# Patient Record
Sex: Female | Born: 1955 | Race: White | Hispanic: No | Smoking: Former smoker
Health system: Southern US, Community
[De-identification: ages and names within clinical notes are randomized; demographics above are authoritative.]

## PROBLEM LIST (undated history)

## (undated) DIAGNOSIS — E119 Type 2 diabetes mellitus without complications: Secondary | ICD-10-CM

## (undated) DIAGNOSIS — Z8489 Family history of other specified conditions: Secondary | ICD-10-CM

## (undated) DIAGNOSIS — K219 Gastro-esophageal reflux disease without esophagitis: Secondary | ICD-10-CM

## (undated) DIAGNOSIS — T8859XA Other complications of anesthesia, initial encounter: Secondary | ICD-10-CM

## (undated) DIAGNOSIS — D649 Anemia, unspecified: Secondary | ICD-10-CM

## (undated) DIAGNOSIS — J449 Chronic obstructive pulmonary disease, unspecified: Secondary | ICD-10-CM

## (undated) DIAGNOSIS — E039 Hypothyroidism, unspecified: Secondary | ICD-10-CM

## (undated) DIAGNOSIS — Z9882 Breast implant status: Secondary | ICD-10-CM

## (undated) DIAGNOSIS — F419 Anxiety disorder, unspecified: Secondary | ICD-10-CM

## (undated) DIAGNOSIS — C50919 Malignant neoplasm of unspecified site of unspecified female breast: Secondary | ICD-10-CM

## (undated) DIAGNOSIS — J189 Pneumonia, unspecified organism: Secondary | ICD-10-CM

## (undated) DIAGNOSIS — I10 Essential (primary) hypertension: Secondary | ICD-10-CM

## (undated) DIAGNOSIS — Z9189 Other specified personal risk factors, not elsewhere classified: Secondary | ICD-10-CM

## (undated) HISTORY — DX: Malignant neoplasm of unspecified site of unspecified female breast: C50.919

## (undated) HISTORY — DX: Essential (primary) hypertension: I10

## (undated) HISTORY — PX: MASTECTOMY: SHX3

## (undated) HISTORY — PX: TONSILLECTOMY: SUR1361

## (undated) HISTORY — PX: OTHER SURGICAL HISTORY: SHX169

## (undated) HISTORY — PX: BREAST SURGERY: SHX581

## (undated) HISTORY — DX: Other specified personal risk factors, not elsewhere classified: Z91.89

## (undated) HISTORY — DX: Chronic obstructive pulmonary disease, unspecified: J44.9

## (undated) HISTORY — PX: BREAST ENHANCEMENT SURGERY: SHX7

## (undated) HISTORY — PX: DILATION AND CURETTAGE OF UTERUS: SHX78

---

## 2004-10-30 DIAGNOSIS — Z789 Other specified health status: Secondary | ICD-10-CM

## 2004-10-30 HISTORY — DX: Other specified health status: Z78.9

## 2009-04-13 ENCOUNTER — Emergency Department: Payer: Self-pay | Admitting: Unknown Physician Specialty

## 2010-03-02 ENCOUNTER — Emergency Department: Payer: Self-pay | Admitting: Emergency Medicine

## 2010-03-04 ENCOUNTER — Emergency Department: Payer: Self-pay | Admitting: Unknown Physician Specialty

## 2010-03-28 ENCOUNTER — Ambulatory Visit: Payer: Self-pay | Admitting: Internal Medicine

## 2010-04-04 ENCOUNTER — Ambulatory Visit: Payer: Self-pay | Admitting: Gastroenterology

## 2012-05-23 ENCOUNTER — Ambulatory Visit: Payer: Self-pay | Admitting: Physician Assistant

## 2013-01-06 ENCOUNTER — Ambulatory Visit: Payer: Self-pay

## 2013-07-17 ENCOUNTER — Ambulatory Visit: Payer: Self-pay | Admitting: Family

## 2014-04-10 ENCOUNTER — Ambulatory Visit: Payer: Self-pay | Admitting: Family Medicine

## 2014-09-09 ENCOUNTER — Ambulatory Visit: Payer: Self-pay | Admitting: Family Medicine

## 2014-09-22 ENCOUNTER — Ambulatory Visit: Payer: Self-pay | Admitting: Family Medicine

## 2014-09-23 ENCOUNTER — Ambulatory Visit: Payer: Self-pay | Admitting: Family Medicine

## 2014-09-23 DIAGNOSIS — E785 Hyperlipidemia, unspecified: Secondary | ICD-10-CM | POA: Insufficient documentation

## 2014-09-23 DIAGNOSIS — R0789 Other chest pain: Secondary | ICD-10-CM | POA: Insufficient documentation

## 2014-09-23 DIAGNOSIS — I10 Essential (primary) hypertension: Secondary | ICD-10-CM | POA: Insufficient documentation

## 2014-10-07 ENCOUNTER — Other Ambulatory Visit: Payer: Self-pay | Admitting: Surgery

## 2014-10-07 DIAGNOSIS — D0512 Intraductal carcinoma in situ of left breast: Secondary | ICD-10-CM

## 2014-10-09 ENCOUNTER — Ambulatory Visit
Admission: RE | Admit: 2014-10-09 | Discharge: 2014-10-09 | Disposition: A | Discharge status: Home / Self Care | Payer: 59 | Source: Ambulatory Visit | Attending: Surgery | Admitting: Surgery

## 2014-10-09 DIAGNOSIS — D0512 Intraductal carcinoma in situ of left breast: Secondary | ICD-10-CM

## 2014-10-09 MED ORDER — GADOBENATE DIMEGLUMINE 529 MG/ML IV SOLN
16.0000 mL | Freq: Once | INTRAVENOUS | Status: AC | PRN
  Administered 2014-10-09: 16 mL via INTRAVENOUS

## 2014-10-13 ENCOUNTER — Ambulatory Visit: Payer: Self-pay | Admitting: Oncology

## 2014-10-26 DIAGNOSIS — C50911 Malignant neoplasm of unspecified site of right female breast: Secondary | ICD-10-CM | POA: Insufficient documentation

## 2014-10-30 ENCOUNTER — Ambulatory Visit: Payer: Self-pay | Admitting: Oncology

## 2014-12-10 ENCOUNTER — Ambulatory Visit: Payer: Self-pay | Admitting: Oncology

## 2014-12-29 ENCOUNTER — Ambulatory Visit
Admit: 2014-12-29 | Disposition: A | Discharge status: Home / Self Care | Payer: Self-pay | Attending: Oncology | Admitting: Oncology

## 2015-02-22 LAB — SURGICAL PATHOLOGY

## 2015-04-13 ENCOUNTER — Other Ambulatory Visit: Payer: Self-pay | Admitting: *Deleted

## 2015-04-13 DIAGNOSIS — C50919 Malignant neoplasm of unspecified site of unspecified female breast: Secondary | ICD-10-CM

## 2015-04-15 ENCOUNTER — Inpatient Hospital Stay (HOSPITAL_BASED_OUTPATIENT_CLINIC_OR_DEPARTMENT_OTHER): Payer: 59 | Admitting: Oncology

## 2015-04-15 ENCOUNTER — Encounter: Payer: Self-pay | Admitting: Oncology

## 2015-04-15 ENCOUNTER — Inpatient Hospital Stay: Payer: 59 | Attending: Oncology

## 2015-04-15 VITALS — BP 95/62 | HR 93 | Temp 98.6°F | Resp 18 | Ht 60.0 in | Wt 173.1 lb

## 2015-04-15 DIAGNOSIS — C50919 Malignant neoplasm of unspecified site of unspecified female breast: Secondary | ICD-10-CM

## 2015-04-15 DIAGNOSIS — Z79899 Other long term (current) drug therapy: Secondary | ICD-10-CM | POA: Diagnosis not present

## 2015-04-15 DIAGNOSIS — Z87891 Personal history of nicotine dependence: Secondary | ICD-10-CM | POA: Diagnosis not present

## 2015-04-15 DIAGNOSIS — I10 Essential (primary) hypertension: Secondary | ICD-10-CM | POA: Diagnosis not present

## 2015-04-15 DIAGNOSIS — Z853 Personal history of malignant neoplasm of breast: Secondary | ICD-10-CM | POA: Diagnosis not present

## 2015-04-15 DIAGNOSIS — Z7982 Long term (current) use of aspirin: Secondary | ICD-10-CM | POA: Insufficient documentation

## 2015-04-15 DIAGNOSIS — J449 Chronic obstructive pulmonary disease, unspecified: Secondary | ICD-10-CM

## 2015-04-15 DIAGNOSIS — Z9013 Acquired absence of bilateral breasts and nipples: Secondary | ICD-10-CM

## 2015-04-15 DIAGNOSIS — C50912 Malignant neoplasm of unspecified site of left female breast: Secondary | ICD-10-CM

## 2015-04-16 LAB — CANCER ANTIGEN 27.29: CA 27.29: 25.8 U/mL (ref 0.0–38.6)

## 2015-05-03 DIAGNOSIS — C50512 Malignant neoplasm of lower-outer quadrant of left female breast: Secondary | ICD-10-CM | POA: Insufficient documentation

## 2015-05-03 NOTE — Progress Notes (Signed)
[place]  Telephone:(336) [phone] Fax:(336) [phone]  ID: Autumn Ramos OB: 1955-12-01  MR#: [id number]  [id number]#:[id number]  [name], MD as PCP - General (Family Medicine)  CHIEF COMPLAINT:  Chief Complaint  Patient presents with  . Follow-up    Breast Cancer    INTERVAL HISTORY: Patient returns to clinic today for routine 4 month evaluation. She underwent bilateral mastectomy with implants on November 20, 2014 at [facility]. Currently, she feels well and is asymptomatic. She has no neurologic complaints. She denies any pain. She denies any recent fevers or illnesses. She has a good appetite and denies weight loss. She denies any chest pain or shortness of breath. She denies any nausea, vomiting, constipation, or diarrhea. She has no urinary complaints. Patient offers no specific complaints today.   REVIEW OF SYSTEMS:   Review of Systems  Constitutional: Negative.   Respiratory: Negative.   Cardiovascular: Negative.   Musculoskeletal: Negative.     As per HPI. Otherwise, a complete review of systems is negatve.  PAST MEDICAL HISTORY: Past Medical History  Diagnosis Date  . Breast cancer   . COPD (chronic obstructive pulmonary disease)   . Hypertension     PAST SURGICAL HISTORY: History reviewed. No pertinent past surgical history.  FAMILY HISTORY Family History  Problem Relation Age of Onset  . Coronary artery disease Father   . Coronary artery disease Mother   . Hypertension Father   . Hypertension Mother        ADVANCED DIRECTIVES:    HEALTH MAINTENANCE: History  Substance Use Topics  . Smoking status: Former Smoker -- 0.25 packs/day for 25 years    Types: Cigarettes    Quit date: 06/14/2014  . Smokeless tobacco: Never Used  . Alcohol Use: No     Colonoscopy:  PAP:  Bone density:  Lipid panel:  Allergies  Allergen Reactions  . Acetylcysteine Shortness Of Breath  . Codeine Itching and Nausea And Vomiting     Current Outpatient Prescriptions  Medication Sig Dispense Refill  . albuterol (PROAIR HFA) 108 (90 BASE) MCG/ACT inhaler 1 or 2 puff by inhalation every 4 hours as needed    . Alogliptin-Metformin HCl (KAZANO) 12.5-500 MG TABS Take 1 tablet by mouth 2 (two) times daily.    Marland Kitchen ALPRAZolam (XANAX) 0.5 MG tablet Take 0.5 mg by mouth.    Marland Kitchen atorvastatin (LIPITOR) 40 MG tablet Take 1 tablet by mouth daily.    Marland Kitchen dexlansoprazole (DEXILANT) 60 MG capsule Take 1 tablet by mouth daily.    . Fluticasone-Salmeterol (ADVAIR DISKUS) 500-50 MCG/DOSE AEPB 1 puff daily.    Marland Kitchen glucose blood (ONE TOUCH ULTRA TEST) test strip     . ibuprofen (ADVIL,MOTRIN) 800 MG tablet     . isometheptene-acetaminophen-dichloralphenazone (MIDRIN) 65-100-325 MG capsule Take 2 capsules by mouth as needed.    Marland Kitchen levothyroxine (SYNTHROID, LEVOTHROID) 75 MCG tablet Take 75 mcg by mouth daily.  0  . lisinopril (PRINIVIL,ZESTRIL) 10 MG tablet Take 1 tablet by mouth daily.    . ondansetron (ZOFRAN) 8 MG tablet     . RA VITAMIN D-3 2000 UNITS CAPS Take 1 tablet by mouth daily.  0  . tiotropium (SPIRIVA HANDIHALER) 18 MCG inhalation capsule Take by mouth.    Marland Kitchen aspirin EC 81 MG tablet Take 1 tablet by mouth daily.     No current facility-administered medications for this visit.    OBJECTIVE: Filed Vitals:   04/15/15 1629  BP: 95/62  Pulse: 93  Temp: 98.6 F (37 C)  Resp: 18     Body mass index is 33.8 kg/(m^2).    ECOG FS:0 - Asymptomatic  General: Well-developed, well-nourished, no acute distress. Eyes: anicteric sclera. Breasts: Patient has bilateral mastectomy with reconstruction. Exam deferred today. Lungs: Clear to auscultation bilaterally. Heart: Regular rate and rhythm. No rubs, murmurs, or gallops. Abdomen: Soft, nontender, nondistended. No organomegaly noted, normoactive bowel sounds. Musculoskeletal: No edema, cyanosis, or clubbing. Neuro: Alert, answering all questions appropriately. Cranial nerves grossly  intact. Skin: No rashes or petechiae noted. Psych: Normal affect.  LAB RESULTS:  No results found for: NA, K, CL, CO2, GLUCOSE, BUN, CREATININE, CALCIUM, PROT, ALBUMIN, AST, ALT, ALKPHOS, BILITOT, GFRNONAA, GFRAA  No results found for: WBC, NEUTROABS, HGB, HCT, MCV, PLT   STUDIES: No results found.  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  PLAN:    1. Breast cancer: Patient is triple negative, her tumor size was less than 0.5 cm therefore no advjuvant chemotherapy was recommended. She had bilateral mastectomy so she does not require XRT. She does not require an aromatase inhibitor given the triple negative status of her disease. CA 27-29 is within normal limits. No intervention is needed at this time. Return to clinic in 4 months for routine evaluation.   Patient expressed understanding and was in agreement with this plan. She also understands that She can call clinic at any time with any questions, concerns, or complaints.   Breast cancer   Staging form: Breast, AJCC 7th Edition     Clinical stage from 05/03/2015: Stage IA (T1b, N0, M0) - Signed by Lloyd Huger, MD on 05/03/2015   Lloyd Huger, MD   05/03/2015 1:08 PM

## 2015-08-19 ENCOUNTER — Inpatient Hospital Stay: Payer: 59 | Attending: Oncology

## 2015-08-26 ENCOUNTER — Inpatient Hospital Stay: Payer: 59 | Admitting: Oncology

## 2016-02-17 ENCOUNTER — Encounter: Payer: Self-pay | Admitting: Oncology

## 2016-02-17 ENCOUNTER — Inpatient Hospital Stay: Payer: 59 | Attending: Oncology

## 2016-02-17 ENCOUNTER — Inpatient Hospital Stay (HOSPITAL_BASED_OUTPATIENT_CLINIC_OR_DEPARTMENT_OTHER): Payer: 59 | Admitting: Oncology

## 2016-02-17 VITALS — BP 107/70 | HR 91 | Resp 16 | Wt 171.1 lb

## 2016-02-17 DIAGNOSIS — J449 Chronic obstructive pulmonary disease, unspecified: Secondary | ICD-10-CM | POA: Insufficient documentation

## 2016-02-17 DIAGNOSIS — Z79899 Other long term (current) drug therapy: Secondary | ICD-10-CM

## 2016-02-17 DIAGNOSIS — Z87891 Personal history of nicotine dependence: Secondary | ICD-10-CM | POA: Diagnosis not present

## 2016-02-17 DIAGNOSIS — I10 Essential (primary) hypertension: Secondary | ICD-10-CM | POA: Diagnosis not present

## 2016-02-17 DIAGNOSIS — C50912 Malignant neoplasm of unspecified site of left female breast: Secondary | ICD-10-CM

## 2016-02-17 DIAGNOSIS — Z7982 Long term (current) use of aspirin: Secondary | ICD-10-CM

## 2016-02-17 DIAGNOSIS — Z9013 Acquired absence of bilateral breasts and nipples: Secondary | ICD-10-CM

## 2016-02-17 DIAGNOSIS — Z853 Personal history of malignant neoplasm of breast: Secondary | ICD-10-CM

## 2016-02-17 DIAGNOSIS — Z171 Estrogen receptor negative status [ER-]: Secondary | ICD-10-CM

## 2016-02-17 NOTE — Progress Notes (Signed)
Patient is here for f/u after 8 months.

## 2016-02-17 NOTE — Progress Notes (Signed)
[place]  Telephone:(336) [phone] Fax:(336) [phone]  ID: Autumn Ramos OB: 1956-06-07  MR#: [id number]  [id number]  [name], MD as PCP - General (Family Medicine)  CHIEF COMPLAINT:  Chief Complaint  Patient presents with  . Breast Cancer    INTERVAL HISTORY: Patient returns to clinic today for routine follow up for breast cancer. Currently, she feels well and is asymptomatic. She has no neurologic complaints. She denies any pain. She denies any recent fevers or illnesses. She has a good appetite and denies weight loss. She denies any chest pain or shortness of breath. She denies any nausea, vomiting, constipation, or diarrhea. She has no urinary complaints. Patient offers no specific complaints today.   REVIEW OF SYSTEMS:   Review of Systems  Constitutional: Negative.   Respiratory: Negative.   Cardiovascular: Negative.   Musculoskeletal: Negative.     As per HPI. Otherwise, a complete review of systems is negatve.  PAST MEDICAL HISTORY: Past Medical History  Diagnosis Date  . Breast cancer ([place])   . COPD (chronic obstructive pulmonary disease) ([place])   . Hypertension   . Last menstrual period (LMP) > 10 days ago 2006    PAST SURGICAL HISTORY: No past surgical history on file.  FAMILY HISTORY Family History  Problem Relation Age of Onset  . Coronary artery disease Father   . Coronary artery disease Mother   . Hypertension Father   . Hypertension Mother        ADVANCED DIRECTIVES:    HEALTH MAINTENANCE: Social History  Substance Use Topics  . Smoking status: Former Smoker -- 0.25 packs/day for 25 years    Types: Cigarettes    Quit date: 06/14/2014  . Smokeless tobacco: Never Used  . Alcohol Use: No    Allergies  Allergen Reactions  . Acetylcysteine Shortness Of Breath  . Codeine Itching and Nausea And Vomiting    Current Outpatient Prescriptions  Medication Sig Dispense Refill  . albuterol  (PROAIR HFA) 108 (90 BASE) MCG/ACT inhaler 1 or 2 puff by inhalation every 4 hours as needed    . Alogliptin-Metformin HCl (KAZANO) 12.5-500 MG TABS Take 1 tablet by mouth 2 (two) times daily.    Marland Kitchen atorvastatin (LIPITOR) 80 MG tablet   0  . dexlansoprazole (DEXILANT) 60 MG capsule Take 1 tablet by mouth daily.    . Fluticasone-Salmeterol (ADVAIR DISKUS) 500-50 MCG/DOSE AEPB 1 puff daily.    Marland Kitchen glucose blood (ONE TOUCH ULTRA TEST) test strip     . ibuprofen (ADVIL,MOTRIN) 800 MG tablet     . isometheptene-acetaminophen-dichloralphenazone (MIDRIN) 65-100-325 MG capsule Take 2 capsules by mouth as needed.    Marland Kitchen levothyroxine (SYNTHROID, LEVOTHROID) 75 MCG tablet Take 75 mcg by mouth daily.  0  . lisinopril (PRINIVIL,ZESTRIL) 10 MG tablet Take 1 tablet by mouth daily.    . ondansetron (ZOFRAN) 8 MG tablet     . RA VITAMIN D-3 2000 UNITS CAPS Take 1 tablet by mouth daily.  0  . tiotropium (SPIRIVA HANDIHALER) 18 MCG inhalation capsule Take by mouth.    Marland Kitchen aspirin EC 81 MG tablet Take 1 tablet by mouth daily. Reported on 02/17/2016     No current facility-administered medications for this visit.    OBJECTIVE: Filed Vitals:   02/17/16 1551  BP: 107/70  Pulse: 91  Resp: 16     Body mass index is 33.41 kg/(m^2).    ECOG FS:0 - Asymptomatic  General: Well-developed, well-nourished, no acute distress. Eyes: anicteric  sclera. Breasts: Patient has bilateral mastectomy with reconstruction. Exam deferred today. Lungs: Clear to auscultation bilaterally. Heart: Regular rate and rhythm. No rubs, murmurs, or gallops. Abdomen: Soft, nontender, nondistended. No organomegaly noted, normoactive bowel sounds. Musculoskeletal: No edema, cyanosis, or clubbing. Neuro: Alert, answering all questions appropriately. Cranial nerves grossly intact. Skin: No rashes or petechiae noted. Psych: Normal affect.  LAB RESULTS:  No results found for: NA, K, CL, CO2, GLUCOSE, BUN, CREATININE, CALCIUM, PROT, ALBUMIN, AST,  ALT, ALKPHOS, BILITOT, GFRNONAA, GFRAA  No results found for: WBC, NEUTROABS, HGB, HCT, MCV, PLT  Lab Results  Component Value Date   LABCA2 19.5 02/17/2016    STUDIES: No results found.  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  PLAN:    1. Breast cancer: Patient is triple negative, her tumor size was less than 0.5 cm therefore no advjuvant chemotherapy was recommended. She had bilateral mastectomy with implants on November 20, 2014 at [facility] so she does not require XRT. She does not require an aromatase inhibitor given the triple negative status of her disease. CA 27-29 continues to be WNL. No intervention is needed at this time. Return to clinic in 6 months for routine evaluation and she will have her tumor marker drawn 2-3 days prior to that visit.   Patient expressed understanding and was in agreement with this plan. She also understands that She can call clinic at any time with any questions, concerns, or complaints.   Breast cancer   Staging form: Breast, AJCC 7th Edition     Clinical stage from 05/03/2015: Stage IA (T1b, N0, M0) - Signed by Lloyd Huger, MD on 05/03/2015   Mayra Reel, NP   02/17/2016 4:15 PM  Patient was seen and evaluated independently and I agree with the assessment and plan as dictated above.  Lloyd Huger, MD 02/20/2016 8:11 PM

## 2016-02-18 ENCOUNTER — Other Ambulatory Visit: Payer: Self-pay

## 2016-02-18 ENCOUNTER — Emergency Department: Payer: 59

## 2016-02-18 ENCOUNTER — Emergency Department
Admission: EM | Admit: 2016-02-18 | Discharge: 2016-02-18 | Disposition: A | Discharge status: Home / Self Care | Payer: 59 | Attending: Emergency Medicine | Admitting: Emergency Medicine

## 2016-02-18 DIAGNOSIS — J40 Bronchitis, not specified as acute or chronic: Secondary | ICD-10-CM | POA: Insufficient documentation

## 2016-02-18 DIAGNOSIS — J449 Chronic obstructive pulmonary disease, unspecified: Secondary | ICD-10-CM | POA: Insufficient documentation

## 2016-02-18 DIAGNOSIS — Z7984 Long term (current) use of oral hypoglycemic drugs: Secondary | ICD-10-CM | POA: Insufficient documentation

## 2016-02-18 DIAGNOSIS — Z853 Personal history of malignant neoplasm of breast: Secondary | ICD-10-CM | POA: Insufficient documentation

## 2016-02-18 DIAGNOSIS — Z87891 Personal history of nicotine dependence: Secondary | ICD-10-CM | POA: Diagnosis not present

## 2016-02-18 DIAGNOSIS — I10 Essential (primary) hypertension: Secondary | ICD-10-CM | POA: Insufficient documentation

## 2016-02-18 DIAGNOSIS — R079 Chest pain, unspecified: Secondary | ICD-10-CM | POA: Diagnosis present

## 2016-02-18 DIAGNOSIS — Z791 Long term (current) use of non-steroidal anti-inflammatories (NSAID): Secondary | ICD-10-CM | POA: Diagnosis not present

## 2016-02-18 DIAGNOSIS — Z79899 Other long term (current) drug therapy: Secondary | ICD-10-CM | POA: Diagnosis not present

## 2016-02-18 LAB — COMPREHENSIVE METABOLIC PANEL
ALK PHOS: 64 U/L (ref 38–126)
ALT: 27 U/L (ref 14–54)
AST: 25 U/L (ref 15–41)
Albumin: 4 g/dL (ref 3.5–5.0)
Anion gap: 8 (ref 5–15)
BILIRUBIN TOTAL: 0.5 mg/dL (ref 0.3–1.2)
BUN: 11 mg/dL (ref 6–20)
CALCIUM: 9.2 mg/dL (ref 8.9–10.3)
CO2: 26 mmol/L (ref 22–32)
CREATININE: 0.64 mg/dL (ref 0.44–1.00)
Chloride: 106 mmol/L (ref 101–111)
GFR calc Af Amer: >60 mL/min (ref >60)
GLUCOSE: 112 mg/dL — AB (ref 65–99)
Potassium: 4.1 mmol/L (ref 3.5–5.1)
Sodium: 140 mmol/L (ref 135–145)
TOTAL PROTEIN: 7 g/dL (ref 6.5–8.1)

## 2016-02-18 LAB — CANCER ANTIGEN 27.29: CA 27.29: 19.5 U/mL (ref 0.0–38.6)

## 2016-02-18 LAB — CBC WITH DIFFERENTIAL/PLATELET
BASOS ABS: 0 10*3/uL (ref 0–0.1)
Basophils Relative: 1 %
Eosinophils Absolute: 0.1 10*3/uL (ref 0–0.7)
Eosinophils Relative: 2 %
HEMATOCRIT: 36.8 % (ref 35.0–47.0)
HEMOGLOBIN: 12.4 g/dL (ref 12.0–16.0)
LYMPHS PCT: 13 %
Lymphs Abs: 1.1 10*3/uL (ref 1.0–3.6)
MCH: 27.7 pg (ref 26.0–34.0)
MCHC: 33.7 g/dL (ref 32.0–36.0)
MCV: 82.2 fL (ref 80.0–100.0)
MONO ABS: 1 10*3/uL — AB (ref 0.2–0.9)
Monocytes Relative: 12 %
NEUTROS ABS: 6.1 10*3/uL (ref 1.4–6.5)
NEUTROS PCT: 72 %
Platelets: 320 10*3/uL (ref 150–440)
RBC: 4.47 MIL/uL (ref 3.80–5.20)
RDW: 15.1 % — AB (ref 11.5–14.5)
WBC: 8.4 10*3/uL (ref 3.6–11.0)

## 2016-02-18 LAB — LIPASE, BLOOD: LIPASE: 23 U/L (ref 11–51)

## 2016-02-18 LAB — TROPONIN I

## 2016-02-18 MED ORDER — AZITHROMYCIN 250 MG PO TABS: 250.0000 mg | ORAL_TABLET | Freq: Every day | ORAL | Status: DC

## 2016-02-18 MED ORDER — GI COCKTAIL ~~LOC~~
30.0000 mL | Freq: Once | ORAL | Status: AC
  Administered 2016-02-18: 30 mL via ORAL
  Filled 2016-02-18: qty 30

## 2016-02-18 MED ORDER — PREDNISONE 20 MG PO TABS: 40.0000 mg | ORAL_TABLET | Freq: Every day | ORAL | Status: DC

## 2016-02-18 MED ORDER — BENZONATATE 100 MG PO CAPS: 100.0000 mg | ORAL_CAPSULE | Freq: Four times a day (QID) | ORAL | Status: DC | PRN

## 2016-02-18 MED ORDER — AZITHROMYCIN 500 MG PO TABS
500.0000 mg | ORAL_TABLET | Freq: Once | ORAL | Status: AC
  Administered 2016-02-18: 500 mg via ORAL
  Filled 2016-02-18: qty 1

## 2016-02-18 MED ORDER — PREDNISONE 20 MG PO TABS
60.0000 mg | ORAL_TABLET | Freq: Once | ORAL | Status: AC
Start: 2016-02-18 — End: 2016-02-18
  Administered 2016-02-18: 60 mg via ORAL
  Filled 2016-02-18: qty 3

## 2016-02-18 NOTE — Discharge Instructions (Signed)
You have been seen in the emergency department today for chest pain and cough. Your workup has shown normal results, and is most consistent with bronchitis. As we discussed please follow-up with your primary care physician in the next several days for recheck. Return to the emergency department for any further chest pain, trouble breathing, or any other symptom personally concerning to yourself.   Nonspecific Chest Pain It is often hard to find the cause of chest pain. There is always a chance that your pain could be related to something serious, such as a heart attack or a blood clot in your lungs. Chest pain can also be caused by conditions that are not life-threatening. If you have chest pain, it is very important to follow up with your doctor.  HOME CARE  If you were prescribed an antibiotic medicine, finish it all even if you start to feel better.  Avoid any activities that cause chest pain.  Do not use any tobacco products, including cigarettes, chewing tobacco, or electronic cigarettes. If you need help quitting, ask your doctor.  Do not drink alcohol.  Take medicines only as told by your doctor.  Keep all follow-up visits as told by your doctor. This is important. This includes any further testing if your chest pain does not go away.  Your doctor may tell you to keep your head raised (elevated) while you sleep.  Make lifestyle changes as told by your doctor. These may include:  Getting regular exercise. Ask your doctor to suggest some activities that are safe for you.  Eating a heart-healthy diet. Your doctor or a diet specialist (dietitian) can help you to learn healthy eating options.  Maintaining a healthy weight.  Managing diabetes, if necessary.  Reducing stress. GET HELP IF:  Your chest pain does not go away, even after treatment.  You have a rash with blisters on your chest.  You have a fever. GET HELP RIGHT AWAY IF:  Your chest pain is worse.  You have an  increasing cough, or you cough up blood.  You have severe belly (abdominal) pain.  You feel extremely weak.  You pass out (faint).  You have chills.  You have sudden, unexplained chest discomfort.  You have sudden, unexplained discomfort in your arms, back, neck, or jaw.  You have shortness of breath at any time.  You suddenly start to sweat, or your skin gets clammy.  You feel nauseous.  You vomit.  You suddenly feel light-headed or dizzy.  Your heart begins to beat quickly, or it feels like it is skipping beats. These symptoms may be an emergency. Do not wait to see if the symptoms will go away. Get medical help right away. Call your local emergency services (911 in the [place] Do not drive yourself to the hospital.   This information is not intended to replace advice given to you by your health care provider. Make sure you discuss any questions you have with your health care provider.   Document Released: 04/03/2008 Document Revised: 11/06/2014 Document Reviewed: 05/22/2014 Elsevier Interactive Patient Education 2016 Elsevier Inc.  Upper Respiratory Infection, Adult Most upper respiratory infections (URIs) are caused by a virus. A URI affects the nose, throat, and upper air passages. The most common type of URI is often called "the common cold." HOME CARE   Take medicines only as told by your doctor.  Gargle warm saltwater or take cough drops to comfort your throat as told by your doctor.  Use a warm mist humidifier  or inhale steam from a shower to increase air moisture. This may make it easier to breathe.  Drink enough fluid to keep your pee (urine) clear or pale yellow.  Eat soups and other clear broths.  Have a healthy diet.  Rest as needed.  Go back to work when your fever is gone or your doctor says it is okay.  You may need to stay home longer to avoid giving your URI to others.  You can also wear a face mask and wash your hands often to prevent spread  of the virus.  Use your inhaler more if you have asthma.  Do not use any tobacco products, including cigarettes, chewing tobacco, or electronic cigarettes. If you need help quitting, ask your doctor. GET HELP IF:  You are getting worse, not better.  Your symptoms are not helped by medicine.  You have chills.  You are getting more short of breath.  You have brown or red mucus.  You have yellow or brown discharge from your nose.  You have pain in your face, especially when you bend forward.  You have a fever.  You have puffy (swollen) neck glands.  You have pain while swallowing.  You have white areas in the back of your throat. GET HELP RIGHT AWAY IF:   You have very bad or constant:  Headache.  Ear pain.  Pain in your forehead, behind your eyes, and over your cheekbones (sinus pain).  Chest pain.  You have long-lasting (chronic) lung disease and any of the following:  Wheezing.  Long-lasting cough.  Coughing up blood.  A change in your usual mucus.  You have a stiff neck.  You have changes in your:  Vision.  Hearing.  Thinking.  Mood. MAKE SURE YOU:   Understand these instructions.  Will watch your condition.  Will get help right away if you are not doing well or get worse.   This information is not intended to replace advice given to you by your health care provider. Make sure you discuss any questions you have with your health care provider.   Document Released: 04/03/2008 Document Revised: 03/02/2015 Document Reviewed: 01/21/2014 Elsevier Interactive Patient Education [employer].

## 2016-02-18 NOTE — ED Provider Notes (Signed)
[facility] Emergency Department Provider Note  Time seen: 8:27 AM  I have reviewed the triage vital signs and the nursing notes.   HISTORY  Chief Complaint Chest Pain and Shortness of Breath    HPI Autumn Ramos is a 60 y.o. female with a past medical history of hypertension, COPD presents the emergency department with cough, shortness of breath and chest discomfort. According to the patient for the past 2 days she has been feeling more short of breath, she has been coughing with yellow sputum production. States a history of COPD with frequent episodes of bronchitis. Also states this morning she was having some mild chest discomfort so she came to the emergency department for evaluation. States the chest discomfort is mostly with cough. Denies any leg pain or swelling. Denies any diaphoresis, or nausea. Patient states her husband passed away 4 weeks ago, and she has been under a lot of stress recently in which she states could be contributing to her chest discomfort. Patient states the chest discomfort is very slight currently dull aching pain to the center of her chest, continues to have a frequent cough.Denies any pleuritic component to her chest pain.     Past Medical History  Diagnosis Date  . Breast cancer ([place])   . COPD (chronic obstructive pulmonary disease) ([place])   . Hypertension   . Last menstrual period (LMP) > 10 days ago 2006    Patient Active Problem List   Diagnosis Date Noted  . Breast cancer ([place]) 05/03/2015    No past surgical history on file.  Current Outpatient Rx  Name  Route  Sig  Dispense  Refill  . albuterol (PROAIR HFA) 108 (90 BASE) MCG/ACT inhaler      1 or 2 puff by inhalation every 4 hours as needed         . Alogliptin-Metformin HCl (KAZANO) 12.5-500 MG TABS   Oral   Take 1 tablet by mouth 2 (two) times daily.         Marland Kitchen aspirin EC 81 MG tablet   Oral   Take 1 tablet by mouth daily. Reported on 02/17/2016        . atorvastatin (LIPITOR) 80 MG tablet            0   . dexlansoprazole (DEXILANT) 60 MG capsule   Oral   Take 1 tablet by mouth daily.         . Fluticasone-Salmeterol (ADVAIR DISKUS) 500-50 MCG/DOSE AEPB      1 puff daily.         Marland Kitchen glucose blood (ONE TOUCH ULTRA TEST) test strip               . ibuprofen (ADVIL,MOTRIN) 800 MG tablet               . isometheptene-acetaminophen-dichloralphenazone (MIDRIN) 65-100-325 MG capsule   Oral   Take 2 capsules by mouth as needed.         Marland Kitchen levothyroxine (SYNTHROID, LEVOTHROID) 75 MCG tablet   Oral   Take 75 mcg by mouth daily.      0   . lisinopril (PRINIVIL,ZESTRIL) 10 MG tablet   Oral   Take 1 tablet by mouth daily.         . ondansetron (ZOFRAN) 8 MG tablet               . RA VITAMIN D-3 2000 UNITS CAPS   Oral   Take 1 tablet by mouth  daily.      0     Dispense as written.   . tiotropium (SPIRIVA HANDIHALER) 18 MCG inhalation capsule   Oral   Take by mouth.           Allergies Acetylcysteine and Codeine  Family History  Problem Relation Age of Onset  . Coronary artery disease Father   . Coronary artery disease Mother   . Hypertension Father   . Hypertension Mother     Social History Social History  Substance Use Topics  . Smoking status: Former Smoker -- 0.25 packs/day for 25 years    Types: Cigarettes    Quit date: 06/14/2014  . Smokeless tobacco: Never Used  . Alcohol Use: No    Review of Systems Constitutional: Negative for fever. Cardiovascular: Mild chest discomfort. Respiratory: Mild shortness of breath. Moderate cough or sputum production. Gastrointestinal: Negative for abdominal pain, vomiting Musculoskeletal: Denies leg pain or swelling. Neurological: Negative for headache 10-point ROS otherwise negative.  ____________________________________________   PHYSICAL EXAM:  VITAL SIGNS: ED Triage Vitals  Enc Vitals Group     BP 02/18/16 0740 123/70 mmHg      Pulse Rate 02/18/16 0740 95     Resp 02/18/16 0740 18     Temp 02/18/16 0740 98.4 F (36.9 C)     Temp src --      SpO2 02/18/16 0740 97 %     Weight 02/18/16 0740 169 lb (76.658 kg)     Height 02/18/16 0740 5' (1.524 m)     Head Cir --      Peak Flow --      Pain Score 02/18/16 0741 5     Pain Loc --      Pain Edu? --      Excl. in [place]? --     Constitutional: Alert and oriented. Well appearing and in no distress. Eyes: Normal exam ENT   Head: Normocephalic and atraumatic.   Mouth/Throat: Mucous membranes are moist. Cardiovascular: Normal rate, regular rhythm. No murmur Respiratory: Normal respiratory effort without tachypnea nor retractions. Breath sounds are clear and equal bilaterally. No wheezes/rales/rhonchi. Occasional cough during exam. Gastrointestinal: Soft and nontender. No distention.  Musculoskeletal: Nontender with normal range of motion in all extremities. No lower extremity edema or tenderness to palpation. Neurologic:  Normal speech and language. No gross focal neurologic deficits  Skin:  Skin is warm, dry and intact.  Psychiatric: Mood and affect are normal.  ____________________________________________    EKG  EKG reviewed and interpreted by myself shows normal sinus rhythm at 92 bpm, narrow QRS, normal axis, normal intervals, nonspecific but no concerning ST changes.  ____________________________________________    RADIOLOGY  Chest x-ray negative  ____________________________________________   INITIAL IMPRESSION / ASSESSMENT AND PLAN / ED COURSE  Pertinent labs & imaging results that were available during my care of the patient were reviewed by me and considered in my medical decision making (see chart for details).  Patient presents the emergency department 2 days of cough, shortness of breath, and chest discomfort which began this morning. Overall the patient appears very well, normal physical exam besides occasional cough. Clear lung sounds  bilaterally. No leg pain or swelling. Vitals are within normal limits. We will check labs, chest x-ray, treat with prednisone and Zithromax while monitoring closely in the emergency department.  Chest x-ray negative. Labs within normal limits. Lipase negative. Repeat troponin negative. Patient had complete resolution of pain after GI cocktail. We'll discharge the patient with Tessalon, prednisone and  Zithromax. Patient agreeable to plan.  ____________________________________________   FINAL CLINICAL IMPRESSION(S) / ED DIAGNOSES  Bronchitis Chest pain Dyspnea   Harvest Dark, MD 02/18/16 1059

## 2016-02-18 NOTE — ED Notes (Signed)
Pt reports sob and cough starting yesterday with CP beginning this am. Pt reports taking 3 albuterol treatments over the past 12 hours with minimal relief. Pt currently in no acute distress

## 2016-02-23 ENCOUNTER — Encounter: Payer: Self-pay | Admitting: *Deleted

## 2016-02-24 ENCOUNTER — Encounter: Payer: Self-pay | Admitting: *Deleted

## 2016-02-24 NOTE — Progress Notes (Signed)
  Oncology Nurse Navigator Documentation      )                                                           

## 2016-02-24 NOTE — Progress Notes (Signed)
  Oncology Nurse Navigator Documentation  Navigator Location: [facility] (02/24/16 0800) Navigator Encounter Type: Telephone (02/24/16 0800)           Patient Visit Type: Follow-up (02/24/16 0800) Treatment Phase: Follow-up (02/24/16 0800) Barriers/Navigation Needs: Coordination of Care (02/24/16 0800)                          Time Spent with Patient: 30 (02/24/16 0800)   Patient called and wanted her lab results.  Confirmed with Dr. Grayland Ormond.  Called and informed patient of her normal labs.  She is to call if she has any questions or needs.

## 2016-02-27 ENCOUNTER — Emergency Department: Payer: 59

## 2016-02-27 ENCOUNTER — Emergency Department
Admission: EM | Admit: 2016-02-27 | Discharge: 2016-02-27 | Disposition: A | Discharge status: Home / Self Care | Payer: 59 | Attending: Student | Admitting: Student

## 2016-02-27 DIAGNOSIS — Z7951 Long term (current) use of inhaled steroids: Secondary | ICD-10-CM | POA: Insufficient documentation

## 2016-02-27 DIAGNOSIS — Z87891 Personal history of nicotine dependence: Secondary | ICD-10-CM | POA: Diagnosis not present

## 2016-02-27 DIAGNOSIS — Z7952 Long term (current) use of systemic steroids: Secondary | ICD-10-CM | POA: Diagnosis not present

## 2016-02-27 DIAGNOSIS — Z791 Long term (current) use of non-steroidal anti-inflammatories (NSAID): Secondary | ICD-10-CM | POA: Diagnosis not present

## 2016-02-27 DIAGNOSIS — Z853 Personal history of malignant neoplasm of breast: Secondary | ICD-10-CM | POA: Insufficient documentation

## 2016-02-27 DIAGNOSIS — J441 Chronic obstructive pulmonary disease with (acute) exacerbation: Secondary | ICD-10-CM | POA: Diagnosis not present

## 2016-02-27 DIAGNOSIS — Z79899 Other long term (current) drug therapy: Secondary | ICD-10-CM | POA: Insufficient documentation

## 2016-02-27 DIAGNOSIS — Z792 Long term (current) use of antibiotics: Secondary | ICD-10-CM | POA: Insufficient documentation

## 2016-02-27 DIAGNOSIS — R0602 Shortness of breath: Secondary | ICD-10-CM | POA: Diagnosis present

## 2016-02-27 DIAGNOSIS — I10 Essential (primary) hypertension: Secondary | ICD-10-CM | POA: Diagnosis not present

## 2016-02-27 LAB — COMPREHENSIVE METABOLIC PANEL
ALT: 28 U/L (ref 14–54)
AST: 25 U/L (ref 15–41)
Albumin: 3.9 g/dL (ref 3.5–5.0)
Alkaline Phosphatase: 76 U/L (ref 38–126)
Anion gap: 8 (ref 5–15)
BILIRUBIN TOTAL: 0.3 mg/dL (ref 0.3–1.2)
BUN: 11 mg/dL (ref 6–20)
CHLORIDE: 103 mmol/L (ref 101–111)
CO2: 31 mmol/L (ref 22–32)
CREATININE: 0.71 mg/dL (ref 0.44–1.00)
Calcium: 9.4 mg/dL (ref 8.9–10.3)
Glucose, Bld: 157 mg/dL — ABNORMAL HIGH (ref 65–99)
POTASSIUM: 4 mmol/L (ref 3.5–5.1)
Sodium: 142 mmol/L (ref 135–145)
TOTAL PROTEIN: 7 g/dL (ref 6.5–8.1)

## 2016-02-27 LAB — CBC WITH DIFFERENTIAL/PLATELET
Band Neutrophils: 0 %
Basophils Absolute: 0 10*3/uL (ref 0–0.1)
Basophils Relative: 0 %
Blasts: 0 %
EOS PCT: 1 %
Eosinophils Absolute: 0.1 10*3/uL (ref 0–0.7)
HEMATOCRIT: 39.2 % (ref 35.0–47.0)
Hemoglobin: 12.8 g/dL (ref 12.0–16.0)
LYMPHS ABS: 2.4 10*3/uL (ref 1.0–3.6)
Lymphocytes Relative: 22 %
MCH: 27.5 pg (ref 26.0–34.0)
MCHC: 32.7 g/dL (ref 32.0–36.0)
MCV: 84 fL (ref 80.0–100.0)
MONOS PCT: 3 %
Metamyelocytes Relative: 0 %
Monocytes Absolute: 0.3 10*3/uL (ref 0.2–0.9)
Myelocytes: 0 %
NEUTROS ABS: 8 10*3/uL — AB (ref 1.4–6.5)
NEUTROS PCT: 74 %
NRBC: 0 /100{WBCs}
OTHER: 0 %
PLATELETS: 328 10*3/uL (ref 150–440)
Promyelocytes Absolute: 0 %
RBC: 4.67 MIL/uL (ref 3.80–5.20)
RDW: 15.6 % — AB (ref 11.5–14.5)
WBC: 10.8 10*3/uL (ref 3.6–11.0)

## 2016-02-27 LAB — TROPONIN I

## 2016-02-27 LAB — FIBRIN DERIVATIVES D-DIMER (ARMC ONLY): FIBRIN DERIVATIVES D-DIMER (ARMC): 294 (ref 0–499)

## 2016-02-27 MED ORDER — PREDNISONE 20 MG PO TABS
60.0000 mg | ORAL_TABLET | Freq: Every day | ORAL | Status: DC
Start: 2016-02-28 — End: 2016-08-28

## 2016-02-27 MED ORDER — IPRATROPIUM-ALBUTEROL 0.5-2.5 (3) MG/3ML IN SOLN
3.0000 mL | Freq: Once | RESPIRATORY_TRACT | Status: AC
  Administered 2016-02-27: 3 mL via RESPIRATORY_TRACT
  Filled 2016-02-27: qty 3

## 2016-02-27 MED ORDER — IPRATROPIUM-ALBUTEROL 0.5-2.5 (3) MG/3ML IN SOLN
3.0000 mL | Freq: Once | RESPIRATORY_TRACT | Status: AC
  Administered 2016-02-27: 3 mL via RESPIRATORY_TRACT

## 2016-02-27 MED ORDER — IPRATROPIUM-ALBUTEROL 0.5-2.5 (3) MG/3ML IN SOLN
RESPIRATORY_TRACT | Status: AC
  Filled 2016-02-27: qty 3

## 2016-02-27 MED ORDER — METHYLPREDNISOLONE SODIUM SUCC 125 MG IJ SOLR
125.0000 mg | Freq: Once | INTRAMUSCULAR | Status: AC
  Administered 2016-02-27: 125 mg via INTRAVENOUS
  Filled 2016-02-27: qty 2

## 2016-02-27 NOTE — ED Notes (Signed)
Discharge instructions reviewed with patient. Patient verbalized understanding. Patient ambulated to lobby without difficulty.   

## 2016-02-27 NOTE — ED Provider Notes (Signed)
[facility] Emergency Department Provider Note   ____________________________________________  Time seen: Approximately 6:37 PM  I have reviewed the triage vital signs and the nursing notes.   HISTORY  Chief Complaint Shortness of Breath    HPI Autumn Ramos is a 60 y.o. female with history of COPD, hypertension, breast cancer managed operatively, no chemotherapy or radiation who presents for evaluation of approximately 10 days of worsening cough and shortness of breath, gradual onset, constant since onset, improves with her inhalers. Patient was seen in this emergency department on 02/18/2016 for similar symptoms. She was discharged with albuterol as well as steroids however reports that her symptoms did not improve significantly. She was seen by her primary care doctor earlier this week and was started on an antibiotic for bony which was diagnosed clinically. She reports that her symptoms have not improved. He continues to have productive cough, denies any hemoptysis or leg swelling. She has had intermittent chest tightness. No vomiting, diarrhea, fevers or chills.   Past Medical History  Diagnosis Date  . Breast cancer ([place])   . COPD (chronic obstructive pulmonary disease) ([place])   . Hypertension   . Last menstrual period (LMP) > 10 days ago 2006    Patient Active Problem List   Diagnosis Date Noted  . Breast cancer ([place]) 05/03/2015    No past surgical history on file.  Current Outpatient Rx  Name  Route  Sig  Dispense  Refill  . albuterol (PROAIR HFA) 108 (90 BASE) MCG/ACT inhaler      1 or 2 puff by inhalation every 4 hours as needed         . albuterol (PROVENTIL) (2.5 MG/3ML) 0.083% nebulizer solution   Nebulization   Take 2.5 mg by nebulization every 6 (six) hours as needed for wheezing or shortness of breath.         . Alogliptin-Metformin HCl (KAZANO) 12.5-500 MG TABS   Oral   Take 1 tablet by mouth 2 (two) times daily.        Marland Kitchen atorvastatin (LIPITOR) 80 MG tablet   Oral   Take 80 mg by mouth daily at 8 pm.       0   . azithromycin (ZITHROMAX) 250 MG tablet   Oral   Take 1 tablet (250 mg total) by mouth daily.   4 each   0   . benzonatate (TESSALON PERLES) 100 MG capsule   Oral   Take 1 capsule (100 mg total) by mouth every 6 (six) hours as needed for cough.   30 capsule   0   . dexlansoprazole (DEXILANT) 60 MG capsule   Oral   Take 60 mg by mouth daily.          . Fluticasone-Salmeterol (ADVAIR) 500-50 MCG/DOSE AEPB   Inhalation   Inhale 1 puff into the lungs 2 (two) times daily.         Marland Kitchen guaiFENesin (MUCINEX) 600 MG 12 hr tablet   Oral   Take 1,200 mg by mouth 2 (two) times daily.         Marland Kitchen ibuprofen (ADVIL,MOTRIN) 800 MG tablet   Oral   Take 800 mg by mouth every 6 (six) hours as needed.          . isometheptene-acetaminophen-dichloralphenazone (MIDRIN) 65-100-325 MG capsule   Oral   Take 2 capsules by mouth as needed.         Marland Kitchen levothyroxine (SYNTHROID, LEVOTHROID) 75 MCG tablet   Oral  Take 75 mcg by mouth daily.      0   . lisinopril (PRINIVIL,ZESTRIL) 10 MG tablet   Oral   Take 1 tablet by mouth daily.         . ondansetron (ZOFRAN) 8 MG tablet   Oral   Take 8 mg by mouth every 8 (eight) hours as needed.          . predniSONE (DELTASONE) 20 MG tablet   Oral   Take 2 tablets (40 mg total) by mouth daily.   10 tablet   0   . predniSONE (DELTASONE) 20 MG tablet   Oral   Take 3 tablets (60 mg total) by mouth daily.   12 tablet   0   . RA VITAMIN D-3 2000 UNITS CAPS   Oral   Take 1 tablet by mouth daily.      0     Dispense as written.   . tiotropium (SPIRIVA HANDIHALER) 18 MCG inhalation capsule   Oral   Take 18 mcg by mouth daily.            Allergies Acetylcysteine and Codeine  Family History  Problem Relation Age of Onset  . Coronary artery disease Father   . Coronary artery disease Mother   . Hypertension Father   .  Hypertension Mother     Social History Social History  Substance Use Topics  . Smoking status: Former Smoker -- 0.25 packs/day for 25 years    Types: Cigarettes    Quit date: 06/14/2014  . Smokeless tobacco: Never Used  . Alcohol Use: No    Review of Systems Constitutional: No fever/chills Eyes: No visual changes. ENT: No sore throat. Cardiovascular: + chest tightness Respiratory: + shortness of breath. Gastrointestinal: No abdominal pain.  No nausea, no vomiting.  No diarrhea.  No constipation. Genitourinary: Negative for dysuria. Musculoskeletal: Negative for back pain. Skin: Negative for rash. Neurological: Negative for headaches, focal weakness or numbness.  10-point ROS otherwise negative.  ____________________________________________   PHYSICAL EXAM:  VITAL SIGNS: ED Triage Vitals  Enc Vitals Group     BP 02/27/16 1627 136/69 mmHg     Pulse Rate 02/27/16 1525 102     Resp 02/27/16 1525 20     Temp --      Temp src --      SpO2 02/27/16 1525 93 %     Weight 02/27/16 1525 173 lb (78.472 kg)     Height 02/27/16 1525 5' (1.524 m)     Head Cir --      Peak Flow --      Pain Score 02/27/16 1526 0     Pain Loc --      Pain Edu? --      Excl. in [place]? --     Constitutional: Alert and oriented. Nontoxic-appearing and in no acute distress though she does have frequent cough on exam. Eyes: Conjunctivae are normal. PERRL. EOMI. Head: Atraumatic. Nose: No congestion/rhinnorhea. Mouth/Throat: Mucous membranes are moist.  Oropharynx non-erythematous. Neck: No stridor.  Supple Without meningismus. Cardiovascular: Normal rate, regular rhythm. Grossly normal heart sounds.  Good peripheral circulation. Respiratory: Normal respiratory effort.  No retractions. Globally diminished breath sounds with faint wheeze in the upper lung fields. Gastrointestinal: Soft and nontender. No distention.  No CVA tenderness. Genitourinary: Deferred Musculoskeletal: No lower extremity  tenderness nor edema.  No joint effusions. Neurologic:  Normal speech and language. No gross focal neurologic deficits are appreciated. No gait instability. Skin:  Skin  is warm, dry and intact. No rash noted. Psychiatric: Mood and affect are normal. Speech and behavior are normal.  ____________________________________________   LABS (all labs ordered are listed, but only abnormal results are displayed)  Labs Reviewed  CBC WITH DIFFERENTIAL/PLATELET - Abnormal; Notable for the following:    RDW 15.6 (*)    Neutro Abs 8.0 (*)    All other components within normal limits  COMPREHENSIVE METABOLIC PANEL - Abnormal; Notable for the following:    Glucose, Bld 157 (*)    All other components within normal limits  TROPONIN I  FIBRIN DERIVATIVES D-DIMER ([facility] ONLY)   ____________________________________________  EKG  ED ECG REPORT I, Joanne Gavel, the attending physician, personally viewed and interpreted this ECG.   Date: 02/27/2016  EKG Time: 15:20  Rate: 95  Rhythm: normal sinus rhythm  Axis: normal  Intervals: Incomplete right bundle branch block.  ST&T Change: No acute ST elevation.  ____________________________________________  RADIOLOGY  CXR IMPRESSION: 1. Chronic changes suggestive of COPD redemonstrated, as above, without definite radiographic evidence of acute cardiopulmonary disease. ____________________________________________   PROCEDURES  Procedure(s) performed: None  Critical Care performed: No  ____________________________________________   INITIAL IMPRESSION / ASSESSMENT AND PLAN / ED COURSE  Pertinent labs & imaging results that were available during my care of the patient were reviewed by me and considered in my medical decision making (see chart for details).  Elder Wilger is a 59 y.o. female with history of COPD, hypertension, breast cancer managed operatively, no chemotherapy or radiation who presents for evaluation of approximately 10  days of worsening cough and shortness of breath as well as intermittent chest tightness which is concerning for ongoing COPD exacerbation.Currently she appears well, her vital signs are stable and she is afebrile, no oxygen requirement or increased work of breathing. X-ray shows no evidence of pneumonia. I suspect her symptoms are secondary to COPD/bronchitis so we will treat symptomatically with DuoNeb treatments as well as steroids. Lab work when she was seen here on 02/18/2016 was unremarkable. The only thing that I will add today is a d-dimer given her continued symptoms with limited improvement with appropriate outpatient management and given her history of breast cancer. Reassess for disposition.  ----------------------------------------- 7:14 PM on 02/27/2016 ----------------------------------------- The patient reports that she feels much better after the above treatments and is requesting immediate discharge. She has improved air movement at this time and continues to appear well. I reviewed her labs, CBC and CMP are unremarkable, negative troponin, d-dimer is not elevated, I doubt that this represents PE, ACS or acute aortic dissection. DC with steroids, return precautions and close PCP follow-up, she is comfortable with the discharge plan.  ____________________________________________   FINAL CLINICAL IMPRESSION(S) / ED DIAGNOSES  Final diagnoses:  Chronic obstructive pulmonary disease with acute exacerbation (HCC)      NEW MEDICATIONS STARTED DURING THIS VISIT:  New Prescriptions   PREDNISONE (DELTASONE) 20 MG TABLET    Take 3 tablets (60 mg total) by mouth daily.     Note:  This document was prepared using Dragon voice recognition software and may include unintentional dictation errors.    Joanne Gavel, MD 02/27/16 [phone]

## 2016-02-27 NOTE — ED Notes (Signed)
Pt becoming impatient and states she will be "walking out the door at 7:30".

## 2016-02-27 NOTE — ED Notes (Signed)
Pt states that she is currently being treated for pneumonia, states that her shortness of breath has gotten worse, and states that when she got to our ER door her breathing was really bad, pt is sitting in a tripod position in attempt to breathe easier

## 2016-03-06 DIAGNOSIS — D0502 Lobular carcinoma in situ of left breast: Secondary | ICD-10-CM | POA: Insufficient documentation

## 2016-03-07 ENCOUNTER — Ambulatory Visit
Admission: RE | Admit: 2016-03-07 | Discharge: 2016-03-07 | Disposition: A | Discharge status: Home / Self Care | Payer: 59 | Source: Ambulatory Visit | Attending: Physician Assistant | Admitting: Physician Assistant

## 2016-03-07 ENCOUNTER — Other Ambulatory Visit: Payer: Self-pay | Admitting: Internal Medicine

## 2016-03-07 DIAGNOSIS — R059 Cough, unspecified: Secondary | ICD-10-CM

## 2016-03-07 DIAGNOSIS — R05 Cough: Secondary | ICD-10-CM

## 2016-03-07 DIAGNOSIS — R918 Other nonspecific abnormal finding of lung field: Secondary | ICD-10-CM | POA: Insufficient documentation

## 2016-03-08 ENCOUNTER — Emergency Department: Payer: 59

## 2016-03-08 ENCOUNTER — Encounter: Payer: Self-pay | Admitting: Emergency Medicine

## 2016-03-08 ENCOUNTER — Observation Stay
Admit: 2016-03-08 | Discharge: 2016-03-08 | Disposition: A | Discharge status: Home / Self Care | Payer: 59 | Attending: Internal Medicine | Admitting: Internal Medicine

## 2016-03-08 ENCOUNTER — Observation Stay
Admission: EM | Admit: 2016-03-08 | Discharge: 2016-03-09 | Disposition: A | Discharge status: Home / Self Care | Payer: 59 | Attending: Internal Medicine | Admitting: Internal Medicine

## 2016-03-08 DIAGNOSIS — E875 Hyperkalemia: Secondary | ICD-10-CM | POA: Insufficient documentation

## 2016-03-08 DIAGNOSIS — R918 Other nonspecific abnormal finding of lung field: Secondary | ICD-10-CM | POA: Diagnosis not present

## 2016-03-08 DIAGNOSIS — Z8249 Family history of ischemic heart disease and other diseases of the circulatory system: Secondary | ICD-10-CM | POA: Insufficient documentation

## 2016-03-08 DIAGNOSIS — J441 Chronic obstructive pulmonary disease with (acute) exacerbation: Secondary | ICD-10-CM | POA: Insufficient documentation

## 2016-03-08 DIAGNOSIS — Z9882 Breast implant status: Secondary | ICD-10-CM | POA: Diagnosis not present

## 2016-03-08 DIAGNOSIS — I10 Essential (primary) hypertension: Secondary | ICD-10-CM | POA: Diagnosis not present

## 2016-03-08 DIAGNOSIS — D72829 Elevated white blood cell count, unspecified: Secondary | ICD-10-CM | POA: Insufficient documentation

## 2016-03-08 DIAGNOSIS — E119 Type 2 diabetes mellitus without complications: Secondary | ICD-10-CM | POA: Insufficient documentation

## 2016-03-08 DIAGNOSIS — J440 Chronic obstructive pulmonary disease with acute lower respiratory infection: Secondary | ICD-10-CM | POA: Diagnosis not present

## 2016-03-08 DIAGNOSIS — J209 Acute bronchitis, unspecified: Secondary | ICD-10-CM | POA: Diagnosis not present

## 2016-03-08 DIAGNOSIS — Z79899 Other long term (current) drug therapy: Secondary | ICD-10-CM | POA: Diagnosis not present

## 2016-03-08 DIAGNOSIS — Z885 Allergy status to narcotic agent status: Secondary | ICD-10-CM | POA: Insufficient documentation

## 2016-03-08 DIAGNOSIS — Z853 Personal history of malignant neoplasm of breast: Secondary | ICD-10-CM | POA: Diagnosis not present

## 2016-03-08 DIAGNOSIS — Z7951 Long term (current) use of inhaled steroids: Secondary | ICD-10-CM | POA: Insufficient documentation

## 2016-03-08 DIAGNOSIS — Z8701 Personal history of pneumonia (recurrent): Secondary | ICD-10-CM | POA: Insufficient documentation

## 2016-03-08 DIAGNOSIS — Z87891 Personal history of nicotine dependence: Secondary | ICD-10-CM | POA: Diagnosis not present

## 2016-03-08 DIAGNOSIS — R0600 Dyspnea, unspecified: Secondary | ICD-10-CM | POA: Diagnosis present

## 2016-03-08 DIAGNOSIS — Z7952 Long term (current) use of systemic steroids: Secondary | ICD-10-CM | POA: Diagnosis not present

## 2016-03-08 DIAGNOSIS — R05 Cough: Secondary | ICD-10-CM | POA: Insufficient documentation

## 2016-03-08 DIAGNOSIS — J208 Acute bronchitis due to other specified organisms: Secondary | ICD-10-CM | POA: Diagnosis present

## 2016-03-08 HISTORY — DX: Type 2 diabetes mellitus without complications: E11.9

## 2016-03-08 HISTORY — DX: Breast implant status: Z98.82

## 2016-03-08 LAB — BASIC METABOLIC PANEL
Anion gap: 10 (ref 5–15)
BUN: 20 mg/dL (ref 6–20)
CALCIUM: 9 mg/dL (ref 8.9–10.3)
CHLORIDE: 101 mmol/L (ref 101–111)
CO2: 26 mmol/L (ref 22–32)
CREATININE: 0.84 mg/dL (ref 0.44–1.00)
GFR calc Af Amer: >60 mL/min (ref >60)
Glucose, Bld: 164 mg/dL — ABNORMAL HIGH (ref 65–99)
Potassium: 5.1 mmol/L (ref 3.5–5.1)
SODIUM: 137 mmol/L (ref 135–145)

## 2016-03-08 LAB — CBC
HCT: 29.6 % — ABNORMAL LOW (ref 35.0–47.0)
Hemoglobin: 9.6 g/dL — ABNORMAL LOW (ref 12.0–16.0)
MCH: 27 pg (ref 26.0–34.0)
MCHC: 32.5 g/dL (ref 32.0–36.0)
MCV: 83.1 fL (ref 80.0–100.0)
PLATELETS: 299 10*3/uL (ref 150–440)
RBC: 3.57 MIL/uL — ABNORMAL LOW (ref 3.80–5.20)
RDW: 15.7 % — AB (ref 11.5–14.5)
WBC: 14.4 10*3/uL — AB (ref 3.6–11.0)

## 2016-03-08 LAB — GLUCOSE, CAPILLARY: Glucose-Capillary: 282 mg/dL — ABNORMAL HIGH (ref 65–99)

## 2016-03-08 LAB — TROPONIN I: TROPONIN I: 0.03 ng/mL (ref <0.031)

## 2016-03-08 MED ORDER — BENZONATATE 100 MG PO CAPS: 100.0000 mg | ORAL_CAPSULE | Freq: Four times a day (QID) | ORAL | Status: DC | PRN

## 2016-03-08 MED ORDER — VANCOMYCIN HCL IN DEXTROSE 1-5 GM/200ML-% IV SOLN
1000.0000 mg | INTRAVENOUS | Status: DC
  Administered 2016-03-08: 23:00:00 1000 mg via INTRAVENOUS
  Filled 2016-03-08 (×3): qty 200

## 2016-03-08 MED ORDER — METFORMIN HCL 500 MG PO TABS
500.0000 mg | ORAL_TABLET | Freq: Two times a day (BID) | ORAL | Status: DC
  Administered 2016-03-09: 500 mg via ORAL
  Filled 2016-03-08: qty 1

## 2016-03-08 MED ORDER — PIPERACILLIN-TAZOBACTAM 3.375 G IVPB
3.3750 g | Freq: Three times a day (TID) | INTRAVENOUS | Status: DC
  Administered 2016-03-09: 09:00:00 3.375 g via INTRAVENOUS
  Filled 2016-03-08 (×3): qty 50

## 2016-03-08 MED ORDER — IPRATROPIUM-ALBUTEROL 0.5-2.5 (3) MG/3ML IN SOLN
6.0000 mL | Freq: Once | RESPIRATORY_TRACT | Status: AC
  Administered 2016-03-08: 6 mL via RESPIRATORY_TRACT
  Filled 2016-03-08: qty 6

## 2016-03-08 MED ORDER — SODIUM CHLORIDE 0.9 % IV BOLUS (SEPSIS)
1000.0000 mL | Freq: Once | INTRAVENOUS | Status: AC
  Administered 2016-03-08: 1000 mL via INTRAVENOUS

## 2016-03-08 MED ORDER — DOCUSATE SODIUM 100 MG PO CAPS
100.0000 mg | ORAL_CAPSULE | Freq: Two times a day (BID) | ORAL | Status: DC
  Administered 2016-03-08 – 2016-03-09 (×2): 100 mg via ORAL
  Filled 2016-03-08 (×2): qty 1

## 2016-03-08 MED ORDER — BISACODYL 5 MG PO TBEC: 5.0000 mg | DELAYED_RELEASE_TABLET | Freq: Every day | ORAL | Status: DC | PRN

## 2016-03-08 MED ORDER — ACETAMINOPHEN 650 MG RE SUPP: 650.0000 mg | Freq: Four times a day (QID) | RECTAL | Status: DC | PRN

## 2016-03-08 MED ORDER — ONDANSETRON HCL 4 MG PO TABS: 4.0000 mg | ORAL_TABLET | Freq: Four times a day (QID) | ORAL | Status: DC | PRN

## 2016-03-08 MED ORDER — TIOTROPIUM BROMIDE MONOHYDRATE 18 MCG IN CAPS
18.0000 ug | ORAL_CAPSULE | Freq: Every day | RESPIRATORY_TRACT | Status: DC
Start: 2016-03-08 — End: 2016-03-09
  Administered 2016-03-09: 09:00:00 18 ug via RESPIRATORY_TRACT
  Filled 2016-03-08: qty 5

## 2016-03-08 MED ORDER — ACETAMINOPHEN 325 MG PO TABS: 650.0000 mg | ORAL_TABLET | Freq: Four times a day (QID) | ORAL | Status: DC | PRN

## 2016-03-08 MED ORDER — IOPAMIDOL (ISOVUE-370) INJECTION 76%
100.0000 mL | Freq: Once | INTRAVENOUS | Status: AC | PRN
  Administered 2016-03-08: 100 mL via INTRAVENOUS

## 2016-03-08 MED ORDER — ENOXAPARIN SODIUM 40 MG/0.4ML ~~LOC~~ SOLN
40.0000 mg | SUBCUTANEOUS | Status: DC
  Administered 2016-03-08: 22:00:00 40 mg via SUBCUTANEOUS
  Filled 2016-03-08: qty 0.4

## 2016-03-08 MED ORDER — PIPERACILLIN-TAZOBACTAM 3.375 G IVPB
3.3750 g | Freq: Once | INTRAVENOUS | Status: AC
  Administered 2016-03-08: 23:00:00 3.375 g via INTRAVENOUS
  Filled 2016-03-08: qty 50

## 2016-03-08 MED ORDER — LINAGLIPTIN 5 MG PO TABS
5.0000 mg | ORAL_TABLET | Freq: Two times a day (BID) | ORAL | Status: DC
  Administered 2016-03-09: 5 mg via ORAL
  Filled 2016-03-08: qty 1

## 2016-03-08 MED ORDER — PANTOPRAZOLE SODIUM 40 MG PO TBEC
40.0000 mg | DELAYED_RELEASE_TABLET | Freq: Every day | ORAL | Status: DC
  Administered 2016-03-09: 09:00:00 40 mg via ORAL
  Filled 2016-03-08: qty 1

## 2016-03-08 MED ORDER — METHYLPREDNISOLONE SODIUM SUCC 125 MG IJ SOLR
60.0000 mg | Freq: Two times a day (BID) | INTRAMUSCULAR | Status: DC
  Administered 2016-03-09: 60 mg via INTRAVENOUS
  Filled 2016-03-08: qty 2

## 2016-03-08 MED ORDER — ALOGLIPTIN-METFORMIN HCL 12.5-500 MG PO TABS: 1.0000 | ORAL_TABLET | Freq: Two times a day (BID) | ORAL | Status: DC

## 2016-03-08 MED ORDER — IPRATROPIUM-ALBUTEROL 0.5-2.5 (3) MG/3ML IN SOLN
3.0000 mL | Freq: Once | RESPIRATORY_TRACT | Status: AC
  Administered 2016-03-08: 3 mL via RESPIRATORY_TRACT
  Filled 2016-03-08: qty 3

## 2016-03-08 MED ORDER — HYDROCODONE-ACETAMINOPHEN 5-325 MG PO TABS: 1.0000 | ORAL_TABLET | ORAL | Status: DC | PRN

## 2016-03-08 MED ORDER — GUAIFENESIN ER 600 MG PO TB12
1200.0000 mg | ORAL_TABLET | Freq: Two times a day (BID) | ORAL | Status: DC
  Administered 2016-03-08 – 2016-03-09 (×2): 1200 mg via ORAL
  Filled 2016-03-08 (×2): qty 2

## 2016-03-08 MED ORDER — INSULIN ASPART 100 UNIT/ML ~~LOC~~ SOLN
0.0000 [IU] | Freq: Three times a day (TID) | SUBCUTANEOUS | Status: DC
  Administered 2016-03-09: 09:00:00 2 [IU] via SUBCUTANEOUS
  Administered 2016-03-09: 5 [IU] via SUBCUTANEOUS
  Filled 2016-03-08: qty 2
  Filled 2016-03-08: qty 5

## 2016-03-08 MED ORDER — LISINOPRIL 10 MG PO TABS
10.0000 mg | ORAL_TABLET | Freq: Every day | ORAL | Status: DC
  Administered 2016-03-09: 09:00:00 10 mg via ORAL
  Filled 2016-03-08: qty 1

## 2016-03-08 MED ORDER — MOMETASONE FURO-FORMOTEROL FUM 200-5 MCG/ACT IN AERO
2.0000 | INHALATION_SPRAY | Freq: Two times a day (BID) | RESPIRATORY_TRACT | Status: DC
  Administered 2016-03-08 – 2016-03-09 (×2): 2 via RESPIRATORY_TRACT
  Filled 2016-03-08: qty 8.8

## 2016-03-08 MED ORDER — VITAMIN D 1000 UNITS PO TABS
2000.0000 [IU] | ORAL_TABLET | Freq: Every day | ORAL | Status: DC
  Administered 2016-03-09: 2000 [IU] via ORAL
  Filled 2016-03-08: qty 2

## 2016-03-08 MED ORDER — ONDANSETRON HCL 4 MG PO TABS: 8.0000 mg | ORAL_TABLET | Freq: Three times a day (TID) | ORAL | Status: DC | PRN

## 2016-03-08 MED ORDER — ONDANSETRON HCL 4 MG/2ML IJ SOLN: 4.0000 mg | Freq: Four times a day (QID) | INTRAMUSCULAR | Status: DC | PRN

## 2016-03-08 MED ORDER — ATORVASTATIN CALCIUM 20 MG PO TABS
80.0000 mg | ORAL_TABLET | Freq: Every day | ORAL | Status: DC
  Administered 2016-03-08: 80 mg via ORAL
  Filled 2016-03-08: qty 4

## 2016-03-08 MED ORDER — PIPERACILLIN-TAZOBACTAM 3.375 G IVPB 30 MIN
3.3750 g | Freq: Four times a day (QID) | INTRAVENOUS | Status: DC
  Filled 2016-03-08 (×5): qty 50

## 2016-03-08 MED ORDER — TRAZODONE HCL 50 MG PO TABS: 25.0000 mg | ORAL_TABLET | Freq: Every evening | ORAL | Status: DC | PRN

## 2016-03-08 MED ORDER — METHYLPREDNISOLONE SODIUM SUCC 125 MG IJ SOLR
125.0000 mg | Freq: Once | INTRAMUSCULAR | Status: AC
  Administered 2016-03-08: 125 mg via INTRAVENOUS
  Filled 2016-03-08: qty 2

## 2016-03-08 MED ORDER — IPRATROPIUM-ALBUTEROL 0.5-2.5 (3) MG/3ML IN SOLN
3.0000 mL | Freq: Four times a day (QID) | RESPIRATORY_TRACT | Status: DC
  Administered 2016-03-09 (×3): 3 mL via RESPIRATORY_TRACT
  Filled 2016-03-08 (×3): qty 3

## 2016-03-08 MED ORDER — LEVOTHYROXINE SODIUM 75 MCG PO TABS
75.0000 ug | ORAL_TABLET | Freq: Every day | ORAL | Status: DC
  Administered 2016-03-09: 75 ug via ORAL
  Filled 2016-03-08: qty 1

## 2016-03-08 NOTE — H&P (Signed)
[place] [facility] [place] NAME: Autumn Ramos    MR#:  [id number]  DATE OF BIRTH:  05-Nov-1955  DATE OF ADMISSION:  03/08/2016  PRIMARY CARE PHYSICIAN: [name], MD   REQUESTING/REFERRING PHYSICIAN: [place]  CHIEF COMPLAINT: Shortness of breath    Chief Complaint  Patient presents with  . Shortness of Breath    HISTORY OF PRESENT ILLNESS:  Autumn Ramos  is a 60 y.o. female with a known history of 0 PDS not on oxygen, hypertension, diabetes mellitus type 2 comes in because of shortness of breath more with ambulation. Patient has been having shortness of breath for 4 weeks tried 4 different antibiotics, 3 rounds of steroids but she feels so short of breath even with minimal walking. Patient had a CT of the chest here showing no pulmonary emboli but the has atelectasis in the right middle lobe. Patient does have lots of cough and green phlegm. No chest pain, no orthopnea, no PND.  PAST MEDICAL HISTORY:   Past Medical History  Diagnosis Date  . Breast cancer ([place])   . COPD (chronic obstructive pulmonary disease) ([place])   . Hypertension   . Last menstrual period (LMP) > 10 days ago 2006  . H/O breast implant   . Diabetes mellitus without complication ([place])     PAST SURGICAL HISTOIRY:   Past Surgical History  Procedure Laterality Date  . Breast enhancement surgery      SOCIAL HISTORY:   Social History  Substance Use Topics  . Smoking status: Former Smoker -- 0.25 packs/day for 25 years    Types: Cigarettes    Quit date: 06/14/2014  . Smokeless tobacco: Never Used  . Alcohol Use: No    FAMILY HISTORY:   Family History  Problem Relation Age of Onset  . Coronary artery disease Father   . Coronary artery disease Mother   . Hypertension Father   . Hypertension Mother     DRUG ALLERGIES:   Allergies  Allergen Reactions  . Acetylcysteine Shortness Of Breath  . Codeine Itching and Nausea And Vomiting    REVIEW OF SYSTEMS:   CONSTITUTIONAL: No fever, fatigue or weakness.  EYES: No blurred or double vision.  EARS, NOSE, AND THROAT: No tinnitus or ear pain.  RESPIRATORY: Cough, shortness of breath with minimal ambulation  CARDIOVASCULAR: No chest pain, orthopnea, edema.  GASTROINTESTINAL: No nausea, vomiting, diarrhea or abdominal pain.  GENITOURINARY: No dysuria, hematuria.  ENDOCRINE: No polyuria, nocturia,  HEMATOLOGY: No anemia, easy bruising or bleeding SKIN: No rash or lesion. MUSCULOSKELETAL: No joint pain or arthritis.   NEUROLOGIC: No tingling, numbness, weakness.  PSYCHIATRY: No anxiety or depression.   MEDICATIONS AT HOME:   Prior to Admission medications   Medication Sig Start Date End Date Taking? Authorizing Provider  albuterol (PROVENTIL HFA;VENTOLIN HFA) 108 (90 Base) MCG/ACT inhaler Inhale 1-2 puffs into the lungs every 4 (four) hours as needed for wheezing or shortness of breath.   Yes Historical Provider, MD  Alogliptin-Metformin HCl (KAZANO) 12.5-500 MG TABS Take 1 tablet by mouth 2 (two) times daily.   Yes Historical Provider, MD  atorvastatin (LIPITOR) 80 MG tablet Take 80 mg by mouth at bedtime.    Yes Historical Provider, MD  benzonatate (TESSALON PERLES) 100 MG capsule Take 1 capsule (100 mg total) by mouth every 6 (six) hours as needed for cough. 02/18/16 02/17/17 Yes Harvest Dark, MD  Cholecalciferol (VITAMIN D) 2000 units tablet Take 2,000 Units by mouth daily.  Yes Historical Provider, MD  dexlansoprazole (DEXILANT) 60 MG capsule Take 60 mg by mouth daily.    Yes Historical Provider, MD  fluconazole (DIFLUCAN) 100 MG tablet Take 100 mg by mouth See admin instructions. Take 100 mg on the first day of levofloxacin, then another 100 mg on the 7th day of levofloxacin 03/07/16  Yes Historical Provider, MD  Fluticasone-Salmeterol (ADVAIR) 500-50 MCG/DOSE AEPB Inhale 1 puff into the lungs 2 (two) times daily.   Yes Historical Provider, MD  guaiFENesin (MUCINEX) 600 MG 12 hr tablet Take  1,200 mg by mouth 2 (two) times daily.   Yes Historical Provider, MD  ibuprofen (ADVIL,MOTRIN) 800 MG tablet Take 800 mg by mouth every 6 (six) hours as needed for mild pain or moderate pain.    Yes Historical Provider, MD  ipratropium-albuterol (DUONEB) 0.5-2.5 (3) MG/3ML SOLN Take 3 mLs by nebulization 3 (three) times daily as needed (for shortness of breath or wheezing).    Yes Historical Provider, MD  isometheptene-acetaminophen-dichloralphenazone (MIDRIN) 65-100-325 MG capsule Take 1-2 capsules by mouth 2 (two) times daily as needed for migraine.    Yes Historical Provider, MD  levofloxacin (LEVAQUIN) 750 MG tablet Take 750 mg by mouth daily. For 7 days 03/07/16  Yes Historical Provider, MD  levothyroxine (SYNTHROID, LEVOTHROID) 75 MCG tablet Take 75 mcg by mouth daily. 02/16/15  Yes Historical Provider, MD  lisinopril (PRINIVIL,ZESTRIL) 10 MG tablet Take 1 tablet by mouth daily.   Yes Historical Provider, MD  ondansetron (ZOFRAN) 8 MG tablet Take 8 mg by mouth every 8 (eight) hours as needed for nausea or vomiting.    Yes Historical Provider, MD  predniSONE (STERAPRED UNI-PAK 48 TAB) 10 MG (48) TBPK tablet Take by mouth See admin instructions. For 12 days 03/07/16  Yes Historical Provider, MD  tiotropium (SPIRIVA HANDIHALER) 18 MCG inhalation capsule Take 18 mcg by mouth daily.  06/12/11  Yes Historical Provider, MD  azithromycin (ZITHROMAX) 250 MG tablet Take 1 tablet (250 mg total) by mouth daily. Patient not taking: Reported on 03/08/2016 02/18/16   Harvest Dark, MD  predniSONE (DELTASONE) 20 MG tablet Take 2 tablets (40 mg total) by mouth daily. Patient not taking: Reported on 03/08/2016 02/18/16   Harvest Dark, MD  predniSONE (DELTASONE) 20 MG tablet Take 3 tablets (60 mg total) by mouth daily. Patient not taking: Reported on 03/08/2016 02/28/16   Joanne Gavel, MD      VITAL SIGNS:  Blood pressure 147/88, pulse 92, temperature 98.7 F (37.1 C), temperature source Oral, resp. rate 20,  height 5' (1.524 m), weight 78.472 kg (173 lb), SpO2 92 %.  PHYSICAL EXAMINATION:  GENERAL:  60 y.o.-year-old patient lying in the bed with no acute distress.  EYES: Pupils equal, round, reactive to light and accommodation. No scleral icterus. Extraocular muscles intact.  HEENT: Head atraumatic, normocephalic. Oropharynx and nasopharynx clear.  NECK:  Supple, no jugular venous distention. No thyroid enlargement, no tenderness.  LUNGS: Decreased breath sounds bilaterally, no wheezing, rales,rhonchi or crepitation. No use of accessory muscles of respiration.  CARDIOVASCULAR: S1, S2 normal. No murmurs, rubs, or gallops.  ABDOMEN: Soft, nontender, nondistended. Bowel sounds present. No organomegaly or mass.  EXTREMITIES: No pedal edema, cyanosis, or clubbing.  NEUROLOGIC: Cranial nerves II through XII are intact. Muscle strength 5/5 in all extremities. Sensation intact. Gait not checked.  PSYCHIATRIC: The patient is alert and oriented x 3.  SKIN: No obvious rash, lesion, or ulcer.   LABORATORY PANEL:   CBC  Recent Labs Lab 03/08/16  1303  WBC 14.4*  HGB 9.6*  HCT 29.6*  PLT 299   ------------------------------------------------------------------------------------------------------------------  Chemistries   Recent Labs Lab 03/08/16 1303  NA 137  K 5.1  CL 101  CO2 26  GLUCOSE 164*  BUN 20  CREATININE 0.84  CALCIUM 9.0   ------------------------------------------------------------------------------------------------------------------  Cardiac Enzymes  Recent Labs Lab 03/08/16 1303  TROPONINI 0.03   ------------------------------------------------------------------------------------------------------------------  RADIOLOGY:  Dg Chest 2 View  03/07/2016  CLINICAL DATA:  Productive cough, shortness of breath for 3 weeks EXAM: CHEST  2 VIEW COMPARISON:  03/07/2016 FINDINGS: Cardiomediastinal silhouette is stable. No acute infiltrate or pulmonary edema. Mild hyperinflation  again noted. Mild degenerative changes thoracic spine. IMPRESSION: No active disease.  Mild hyperinflation again noted. Electronically Signed   By: Lahoma Crocker M.D.   On: 03/07/2016 10:35   Ct Angio Chest Pe W/cm &/or Wo Cm  03/08/2016  CLINICAL DATA:  Shortness of breath x3 weeks EXAM: CT ANGIOGRAPHY CHEST WITH CONTRAST TECHNIQUE: Multidetector CT imaging of the chest was performed using the standard protocol during bolus administration of intravenous contrast. Multiplanar CT image reconstructions and MIPs were obtained to evaluate the vascular anatomy. CONTRAST:  100 mL Isovue 370 IV COMPARISON:  Chest radiograph dated 03/07/2016 FINDINGS: No evidence of pulmonary embolism. Mediastinum/Nodes: Heart is normal in size. No pericardial effusion. No evidence of thoracic aortic aneurysm. No suspicious mediastinal lymphadenopathy. Visualized left thyroid is mildly nodular. Lungs/Pleura: No suspicious pulmonary nodules. Mild mosaic attenuation. Mild subpleural patchy opacity anteriorly in the right upper lobe (series 6/ image 72), likely reflecting subpleural atelectasis. Mild patchy opacity/ atelectasis in the medial right middle lobe (series 6/ image 99). No focal consolidation. No pleural effusion or pneumothorax. Upper abdomen: Visualized upper abdomen is unremarkable. Musculoskeletal: Bilateral breast augmentation. Mild degenerative changes of the visualized thoracolumbar spine. Review of the MIP images confirms the above findings. IMPRESSION: No evidence of pulmonary embolism. No evidence of acute cardiopulmonary disease. Electronically Signed   By: Julian Hy M.D.   On: 03/08/2016 18:03    EKG:   Orders placed or performed during the hospital encounter of 03/08/16  . EKG 12-Lead  . EKG 12-Lead  . ED EKG  . ED EKG   Normal sinus rhythm 90 bpm no ST-T changes. IMPRESSION AND PLAN:  #1 shortness of breath with minimal ambulation likely secondary to community-acquired pneumonia in the right lung,  failed outpatient therapy with 3 different antibiotics: Still feels short of breath with minimal ambulation without hypoxia, does have cough and green phlegm: Sputum cultures are already sent to the lab, continue vancomycin, Zosyn, IV steroids, nebulizers.  #2 exertional dyspnea: Evaluate for cardiac source: Check echocardiogram, possibly stress test if needed. Troponins is negative. #3 diabetes mellitus type 2: Continue home medications. Sliding scale with coverage because patient already on steroids. #4 ,essential hypertension: Continue home medications.  5 .mild hyperkalemia watch closely.  #6 leukocytosis likely due to steroid use. All the records are reviewed and case discussed with ED provider. Management plans discussed with the patient, family and they are in agreement.  CODE STATUS: Full  TOTAL TIME TAKING CARE OF THIS PATIENT: 55 minutes.    Epifanio Lesches M.D on 03/08/2016 at 7:19 PM  Between 7am to 6pm - Pager - [phone]  After 6pm go to [email or web address] - password [email or web address] [facility]  Office  [phone]  CC: Primary care physician; [name], MD  Note: This dictation was prepared with Dragon dictation along with smaller phrase technology. Any transcriptional errors that result  from this process are unintentional.

## 2016-03-08 NOTE — ED Provider Notes (Signed)
[facility] Emergency Department Provider Note   ____________________________________________  Time seen: Approximately 325 PM  I have reviewed the triage vital signs and the nursing notes.   HISTORY  Chief Complaint Shortness of Breath   HPI Autumn Ramos is a 60 y.o. female with a history of COPD who is presenting to the emergency department today with 3 weeks of worsening shortness of breath. She says that it also is associated with intermittent central chest pain which she describes as sharp and radiating to her back. However, she describes this chest pain is very similar to her "GERD attacks." She says that she has been on several courses of steroids and it is on her second day of 60 mg of prednisone that was prescribed by her pulmonologist, Dr. Radford Pax. She said that she saw her pulmonologist yesterday who did a chest x-ray. She says that she is fine when she is sitting but as soon as she starts to walk she gets short of breath. She says that she will get short of breath after speaking a long sentence. She also says that she has a productive cough with clear to brownish mucus. Says has a history of smoking and also a history of breast cancer. Had a mastectomy one year ago and says she has been cancer free since.   Past Medical History  Diagnosis Date  . Breast cancer ([place])   . COPD (chronic obstructive pulmonary disease) ([place])   . Hypertension   . Last menstrual period (LMP) > 10 days ago 2006  . H/O breast implant   . Diabetes mellitus without complication [facility])     Patient Active Problem List   Diagnosis Date Noted  . Breast cancer ([place]) 05/03/2015    Past Surgical History  Procedure Laterality Date  . Breast enhancement surgery      Current Outpatient Rx  Name  Route  Sig  Dispense  Refill  . albuterol (PROVENTIL HFA;VENTOLIN HFA) 108 (90 Base) MCG/ACT inhaler   Inhalation   Inhale 1-2 puffs into the lungs every 4 (four) hours as  needed for wheezing or shortness of breath.         . Alogliptin-Metformin HCl (KAZANO) 12.5-500 MG TABS   Oral   Take 1 tablet by mouth 2 (two) times daily.         Marland Kitchen atorvastatin (LIPITOR) 80 MG tablet   Oral   Take 80 mg by mouth at bedtime.       0   . benzonatate (TESSALON PERLES) 100 MG capsule   Oral   Take 1 capsule (100 mg total) by mouth every 6 (six) hours as needed for cough.   30 capsule   0   . Cholecalciferol (VITAMIN D) 2000 units tablet   Oral   Take 2,000 Units by mouth daily.         Marland Kitchen dexlansoprazole (DEXILANT) 60 MG capsule   Oral   Take 60 mg by mouth daily.          . fluconazole (DIFLUCAN) 100 MG tablet   Oral   Take 100 mg by mouth See admin instructions. Take 100 mg on the first day of levofloxacin, then another 100 mg on the 7th day of levofloxacin         . Fluticasone-Salmeterol (ADVAIR) 500-50 MCG/DOSE AEPB   Inhalation   Inhale 1 puff into the lungs 2 (two) times daily.         Marland Kitchen guaiFENesin (MUCINEX) 600 MG 12  hr tablet   Oral   Take 1,200 mg by mouth 2 (two) times daily.         Marland Kitchen ibuprofen (ADVIL,MOTRIN) 800 MG tablet   Oral   Take 800 mg by mouth every 6 (six) hours as needed for mild pain or moderate pain.          Marland Kitchen ipratropium-albuterol (DUONEB) 0.5-2.5 (3) MG/3ML SOLN   Nebulization   Take 3 mLs by nebulization 3 (three) times daily as needed (for shortness of breath or wheezing).          . isometheptene-acetaminophen-dichloralphenazone (MIDRIN) 65-100-325 MG capsule   Oral   Take 1-2 capsules by mouth 2 (two) times daily as needed for migraine.          Marland Kitchen levofloxacin (LEVAQUIN) 750 MG tablet   Oral   Take 750 mg by mouth daily. For 7 days         . levothyroxine (SYNTHROID, LEVOTHROID) 75 MCG tablet   Oral   Take 75 mcg by mouth daily.      0   . lisinopril (PRINIVIL,ZESTRIL) 10 MG tablet   Oral   Take 1 tablet by mouth daily.         . ondansetron (ZOFRAN) 8 MG tablet   Oral   Take 8  mg by mouth every 8 (eight) hours as needed for nausea or vomiting.          . predniSONE (STERAPRED UNI-PAK 48 TAB) 10 MG (48) TBPK tablet   Oral   Take by mouth See admin instructions. For 12 days         . tiotropium (SPIRIVA HANDIHALER) 18 MCG inhalation capsule   Oral   Take 18 mcg by mouth daily.          Marland Kitchen azithromycin (ZITHROMAX) 250 MG tablet   Oral   Take 1 tablet (250 mg total) by mouth daily. Patient not taking: Reported on 03/08/2016   4 each   0   . predniSONE (DELTASONE) 20 MG tablet   Oral   Take 2 tablets (40 mg total) by mouth daily. Patient not taking: Reported on 03/08/2016   10 tablet   0   . predniSONE (DELTASONE) 20 MG tablet   Oral   Take 3 tablets (60 mg total) by mouth daily. Patient not taking: Reported on 03/08/2016   12 tablet   0     Allergies Acetylcysteine and Codeine  Family History  Problem Relation Age of Onset  . Coronary artery disease Father   . Coronary artery disease Mother   . Hypertension Father   . Hypertension Mother     Social History Social History  Substance Use Topics  . Smoking status: Former Smoker -- 0.25 packs/day for 25 years    Types: Cigarettes    Quit date: 06/14/2014  . Smokeless tobacco: Never Used  . Alcohol Use: No    Review of Systems Constitutional: No fever/chills Eyes: No visual changes. ENT: No sore throat. Cardiovascular: As above Respiratory: As above Gastrointestinal: No abdominal pain.  No nausea, no vomiting.  No diarrhea.  No constipation. Genitourinary: Negative for dysuria. Musculoskeletal: As above Skin: Negative for rash. Neurological: Negative for headaches, focal weakness or numbness.  10-point ROS otherwise negative.  ____________________________________________   PHYSICAL EXAM:  VITAL SIGNS: ED Triage Vitals  Enc Vitals Group     BP 03/08/16 1258 128/52 mmHg     Pulse Rate 03/08/16 1258 92     Resp 03/08/16 1258  20     Temp 03/08/16 1258 98.7 F (37.1 C)      Temp Source 03/08/16 1258 Oral     SpO2 03/08/16 1257 99 %     Weight 03/08/16 1258 173 lb (78.472 kg)     Height 03/08/16 1258 5' (1.524 m)     Head Cir --      Peak Flow --      Pain Score 03/08/16 1300 0     Pain Loc --      Pain Edu? --      Excl. in [place]? --     Constitutional: Alert and oriented. Well appearing and in no acute distress. Eyes: Conjunctivae are normal. PERRL. EOMI. Head: Atraumatic. Nose: No congestion/rhinnorhea.Wearing nasal cannula oxygen. Mouth/Throat: Mucous membranes are moist.   Neck: No stridor.   Cardiovascular: Normal rate, regular rhythm. Grossly normal heart sounds.  Good peripheral circulation. Respiratory:   Mildly labored respirations but without retraction. Speaks in full sentences. Prolonged expiratory phase with mild wheezes to the right mid field. Gastrointestinal: Soft and nontender. No distention.  No CVA tenderness. Musculoskeletal: No lower extremity tenderness nor edema.  No joint effusions. Neurologic:  Normal speech and language. No gross focal neurologic deficits are appreciated. Skin:  Skin is warm, dry and intact. No rash noted. Psychiatric: Mood and affect are normal. Speech and behavior are normal.  ____________________________________________   LABS (all labs ordered are listed, but only abnormal results are displayed)  Labs Reviewed  BASIC METABOLIC PANEL - Abnormal; Notable for the following:    Glucose, Bld 164 (*)    All other components within normal limits  CBC - Abnormal; Notable for the following:    WBC 14.4 (*)    RBC 3.57 (*)    Hemoglobin 9.6 (*)    HCT 29.6 (*)    RDW 15.7 (*)    All other components within normal limits  CULTURE, EXPECTORATED SPUTUM-ASSESSMENT  TROPONIN I   ____________________________________________  EKG  ED ECG REPORT I, Doran Stabler, the attending physician, personally viewed and interpreted this ECG.   Date: 03/08/2016  EKG Time: 1302  Rate: 90  Rhythm: normal sinus  rhythm  Axis: Normal axis  Intervals:none  ST&T Change: No ST elevation or depression. No abnormal T-wave inversion.  ____________________________________________  RADIOLOGY  Chest x-ray done yesterday without any acute disease.   CT Angio Chest PE W/Cm &/Or Wo Cm (Final result) Result time: 03/08/16 18:03:46   Final result by Rad Results In Interface (03/08/16 18:03:46)   Narrative:   CLINICAL DATA: Shortness of breath x3 weeks  EXAM: CT ANGIOGRAPHY CHEST WITH CONTRAST  TECHNIQUE: Multidetector CT imaging of the chest was performed using the standard protocol during bolus administration of intravenous contrast. Multiplanar CT image reconstructions and MIPs were obtained to evaluate the vascular anatomy.  CONTRAST: 100 mL Isovue 370 IV  COMPARISON: Chest radiograph dated 03/07/2016  FINDINGS: No evidence of pulmonary embolism.  Mediastinum/Nodes: Heart is normal in size. No pericardial effusion.  No evidence of thoracic aortic aneurysm.  No suspicious mediastinal lymphadenopathy.  Visualized left thyroid is mildly nodular.  Lungs/Pleura: No suspicious pulmonary nodules.  Mild mosaic attenuation.  Mild subpleural patchy opacity anteriorly in the right upper lobe (series 6/ image 72), likely reflecting subpleural atelectasis.  Mild patchy opacity/ atelectasis in the medial right middle lobe (series 6/ image 99).  No focal consolidation.  No pleural effusion or pneumothorax.  Upper abdomen: Visualized upper abdomen is unremarkable.  Musculoskeletal: Bilateral breast augmentation.  Mild degenerative changes of the visualized thoracolumbar spine.  Review of the MIP images confirms the above findings.  IMPRESSION: No evidence of pulmonary embolism.  No evidence of acute cardiopulmonary disease.   Electronically Signed By: Julian Hy M.D. On: 03/08/2016 18:03     ____________________________________________   PROCEDURES   ____________________________________________   INITIAL IMPRESSION / ASSESSMENT AND PLAN / ED COURSE  Pertinent labs & imaging results that were available during my care of the patient were reviewed by me and considered in my medical decision making (see chart for details).  ----------------------------------------- 4:36 PM on 03/08/2016 -----------------------------------------  I discussed the case with the patient's pulmonologist, Dr. Radford Pax says that the patient is on her third course of steroids and has had 2 negative chest x-rays. He is suspecting other etiologies at this point. Workup for pulmonary embolus with CAT scan.   ----------------------------------------- 6:42 PM on 03/08/2016 -----------------------------------------  Reassuring CAT scan without pulmonary embolus. However, the patient has only minimal relief at this time.  She was able to walk but continues with shortness of breath especially with this exertion. We auscultated and still with prolonged for a phase. We'll admit to the hospital for outpatient treatment. The patient says that she has had 3 course of steroids at this point as well as for course of antibiotics. She understands the plan for admission willing to comply. I also reviewed her imaging and lab results. ____________________________________________   FINAL CLINICAL IMPRESSION(S) / ED DIAGNOSES  Dyspnea.    NEW MEDICATIONS STARTED DURING THIS VISIT:  New Prescriptions   No medications on file     Note:  This document was prepared using Dragon voice recognition software and may include unintentional dictation errors.    Orbie Pyo, MD 03/08/16 [phone]

## 2016-03-08 NOTE — ED Notes (Signed)
Patient to ER for c/o shortness of breath x3 weeks that worsens with exertion. Patient saw pulmonologist yesterday.

## 2016-03-08 NOTE — ED Notes (Signed)
Pt returned from CT °

## 2016-03-09 LAB — BASIC METABOLIC PANEL
ANION GAP: 8 (ref 5–15)
BUN: 17 mg/dL (ref 6–20)
CALCIUM: 9.2 mg/dL (ref 8.9–10.3)
CHLORIDE: 101 mmol/L (ref 101–111)
CO2: 27 mmol/L (ref 22–32)
Creatinine, Ser: 0.77 mg/dL (ref 0.44–1.00)
GFR calc non Af Amer: >60 mL/min (ref >60)
GLUCOSE: 214 mg/dL — AB (ref 65–99)
Potassium: 4.1 mmol/L (ref 3.5–5.1)
Sodium: 136 mmol/L (ref 135–145)

## 2016-03-09 LAB — ECHOCARDIOGRAM COMPLETE
HEIGHTINCHES: 60 in
WEIGHTICAEL: 2776 [oz_av]

## 2016-03-09 LAB — EXPECTORATED SPUTUM ASSESSMENT W GRAM STAIN, RFLX TO RESP C: Special Requests: NORMAL

## 2016-03-09 LAB — CBC
HEMATOCRIT: 36.4 % (ref 35.0–47.0)
HEMOGLOBIN: 11.9 g/dL — AB (ref 12.0–16.0)
MCH: 27 pg (ref 26.0–34.0)
MCHC: 32.6 g/dL (ref 32.0–36.0)
MCV: 82.7 fL (ref 80.0–100.0)
Platelets: 347 10*3/uL (ref 150–440)
RBC: 4.4 MIL/uL (ref 3.80–5.20)
RDW: 15.4 % — ABNORMAL HIGH (ref 11.5–14.5)
WBC: 15.7 10*3/uL — ABNORMAL HIGH (ref 3.6–11.0)

## 2016-03-09 LAB — HEMOGLOBIN A1C: Hgb A1c MFr Bld: 7 % — ABNORMAL HIGH (ref 4.0–6.0)

## 2016-03-09 LAB — GLUCOSE, CAPILLARY
GLUCOSE-CAPILLARY: 210 mg/dL — AB (ref 65–99)
Glucose-Capillary: 134 mg/dL — ABNORMAL HIGH (ref 65–99)

## 2016-03-09 MED ORDER — METHYLPREDNISOLONE SODIUM SUCC 125 MG IJ SOLR
60.0000 mg | Freq: Once | INTRAMUSCULAR | Status: AC
  Administered 2016-03-09: 60 mg via INTRAVENOUS
  Filled 2016-03-09: qty 2

## 2016-03-09 MED ORDER — IPRATROPIUM-ALBUTEROL 0.5-2.5 (3) MG/3ML IN SOLN
3.0000 mL | RESPIRATORY_TRACT | Status: DC | PRN
  Administered 2016-03-09: 05:00:00 3 mL via RESPIRATORY_TRACT
  Filled 2016-03-09: qty 3

## 2016-03-09 MED ORDER — IPRATROPIUM-ALBUTEROL 0.5-2.5 (3) MG/3ML IN SOLN: 3.0000 mL | Freq: Once | RESPIRATORY_TRACT | Status: DC

## 2016-03-09 NOTE — Progress Notes (Signed)
Patient discharged home per MD order. All discharge instructions given and all questions answered. 

## 2016-03-09 NOTE — Discharge Summary (Signed)
Autumn Ramos, 60 y.o., DOB [birth year], MRN [id number]. Admission date: 03/08/2016 Discharge Date 03/09/2016 Primary MD [name], MD Admitting Physician Epifanio Lesches, MD  Admission Diagnosis  Dyspnea [R06.00]  Discharge Diagnosis   Active Problems:   Acute bronchitis   Acute COPD exasperation   Hypertension History of breast implant Diabetes type 2 with a complicate       [place] is a 60 y.o. female with a known history of COPD not on oxygen, hypertension, diabetes mellitus type 2 comes in because of shortness of breath more with ambulation. Patient has been having shortness of breath for 4 weeks tried 4 different antibiotics, 3 rounds of steroids but she feels so short of breath even with minimal walking. Patient had a CT of the chest here showing no pulmonary emboli. Patient was admitted for further evaluation and therapy. She was placed on IV Solu-Medrol and IV antibiotics with significant improvement in her symptoms. She is placed under observation her breathing is much improved and is feeling back to baseline and stable for discharge.            Consults  None  Significant Tests:  See full reports for all details      Dg Chest 2 View  03/07/2016  CLINICAL DATA:  Productive cough, shortness of breath for 3 weeks EXAM: CHEST  2 VIEW COMPARISON:  03/07/2016 FINDINGS: Cardiomediastinal silhouette is stable. No acute infiltrate or pulmonary edema. Mild hyperinflation again noted. Mild degenerative changes thoracic spine. IMPRESSION: No active disease.  Mild hyperinflation again noted. Electronically Signed   By: Lahoma Crocker M.D.   On: 03/07/2016 10:35   Dg Chest 2 View  02/27/2016  CLINICAL DATA:  60 year old female with shortness of breath. Diagnosed with pneumonia 1 week ago. History of COPD. Former smoker. EXAM: CHEST  2 VIEW COMPARISON:  Chest x-ray 02/18/2016. FINDINGS: Mild diffuse peribronchial cuffing. Mild emphysematous changes. No acute  consolidative airspace disease. No pleural effusions. No evidence of pulmonary edema. Heart size is normal. Upper mediastinal contours are within normal limits. Numerous surgical clips in the left hilar region. IMPRESSION: 1. Chronic changes suggestive of COPD redemonstrated, as above, without definite radiographic evidence of acute cardiopulmonary disease. Electronically Signed   By: Vinnie Langton M.D.   On: 02/27/2016 16:32   Dg Chest 2 View  02/18/2016  CLINICAL DATA:  Productive cough and short of breath for 1 day EXAM: CHEST  2 VIEW COMPARISON:  01/06/2013 FINDINGS: Normal heart size. Minimal posterior left basilar atelectasis. Otherwise clear lungs. No pneumothorax. No pleural effusion. IMPRESSION: No active cardiopulmonary disease. Electronically Signed   By: Marybelle Killings M.D.   On: 02/18/2016 08:45   Ct Angio Chest Pe W/cm &/or Wo Cm  03/08/2016  CLINICAL DATA:  Shortness of breath x3 weeks EXAM: CT ANGIOGRAPHY CHEST WITH CONTRAST TECHNIQUE: Multidetector CT imaging of the chest was performed using the standard protocol during bolus administration of intravenous contrast. Multiplanar CT image reconstructions and MIPs were obtained to evaluate the vascular anatomy. CONTRAST:  100 mL Isovue 370 IV COMPARISON:  Chest radiograph dated 03/07/2016 FINDINGS: No evidence of pulmonary embolism. Mediastinum/Nodes: Heart is normal in size. No pericardial effusion. No evidence of thoracic aortic aneurysm. No suspicious mediastinal lymphadenopathy. Visualized left thyroid is mildly nodular. Lungs/Pleura: No suspicious pulmonary nodules. Mild mosaic attenuation. Mild subpleural patchy opacity anteriorly in the right upper lobe (series 6/ image 72), likely reflecting subpleural atelectasis. Mild patchy opacity/ atelectasis in the medial right middle lobe (series 6/ image 99). No  focal consolidation. No pleural effusion or pneumothorax. Upper abdomen: Visualized upper abdomen is unremarkable. Musculoskeletal:  Bilateral breast augmentation. Mild degenerative changes of the visualized thoracolumbar spine. Review of the MIP images confirms the above findings. IMPRESSION: No evidence of pulmonary embolism. No evidence of acute cardiopulmonary disease. Electronically Signed   By: Julian Hy M.D.   On: 03/08/2016 18:03       Today   Subjective:   Autumn Ramos  feels well shortness of breath resolved  Objective:   Blood pressure 128/55, pulse 94, temperature 97.9 F (36.6 C), temperature source Oral, resp. rate 19, height 5' (1.524 m), weight 78.79 kg (173 lb 11.2 oz), SpO2 94 %.  .  Intake/Output Summary (Last 24 hours) at 03/09/16 1210 Last data filed at 03/09/16 0948  Gross per 24 hour  Intake    240 ml  Output      0 ml  Net    240 ml    Exam VITAL SIGNS: Blood pressure 128/55, pulse 94, temperature 97.9 F (36.6 C), temperature source Oral, resp. rate 19, height 5' (1.524 m), weight 78.79 kg (173 lb 11.2 oz), SpO2 94 %.  GENERAL:  60 y.o.-year-old patient lying in the bed with no acute distress.  EYES: Pupils equal, round, reactive to light and accommodation. No scleral icterus. Extraocular muscles intact.  HEENT: Head atraumatic, normocephalic. Oropharynx and nasopharynx clear.  NECK:  Supple, no jugular venous distention. No thyroid enlargement, no tenderness.  LUNGS: Normal breath sounds bilaterally, no wheezing, rales,rhonchi or crepitation. No use of accessory muscles of respiration.  CARDIOVASCULAR: S1, S2 normal. No murmurs, rubs, or gallops.  ABDOMEN: Soft, nontender, nondistended. Bowel sounds present. No organomegaly or mass.  EXTREMITIES: No pedal edema, cyanosis, or clubbing.  NEUROLOGIC: Cranial nerves II through XII are intact. Muscle strength 5/5 in all extremities. Sensation intact. Gait not checked.  PSYCHIATRIC: The patient is alert and oriented x 3.  SKIN: No obvious rash, lesion, or ulcer.   Data Review     CBC w Diff: Lab Results  Component Value Date    WBC 15.7* 03/09/2016   HGB 11.9* 03/09/2016   HCT 36.4 03/09/2016   PLT 347 03/09/2016   LYMPHOPCT 22 02/27/2016   BANDSPCT 0 02/27/2016   MONOPCT 3 02/27/2016   EOSPCT 1 02/27/2016   BASOPCT 0 02/27/2016   CMP: Lab Results  Component Value Date   NA 136 03/09/2016   K 4.1 03/09/2016   CL 101 03/09/2016   CO2 27 03/09/2016   BUN 17 03/09/2016   CREATININE 0.77 03/09/2016   PROT 7.0 02/27/2016   ALBUMIN 3.9 02/27/2016   BILITOT 0.3 02/27/2016   ALKPHOS 76 02/27/2016   AST 25 02/27/2016   ALT 28 02/27/2016  .  Micro Results Recent Results (from the past 240 hour(s))  Culture, expectorated sputum-assessment     Status: None   Collection Time: 03/08/16  1:09 PM  Result Value Ref Range Status   Specimen Description SPUTUM  Final   Special Requests Normal  Final   Sputum evaluation THIS SPECIMEN IS ACCEPTABLE FOR SPUTUM CULTURE  Final   Report Status 03/09/2016 FINAL  Final  Culture, respiratory (NON-Expectorated)     Status: None (Preliminary result)   Collection Time: 03/08/16  1:09 PM  Result Value Ref Range Status   Specimen Description SPUTUM  Final   Special Requests Normal Reflexed from [id number]  Final   Gram Stain PENDING  Incomplete   Culture TOO YOUNG TO READ  Final  Report Status PENDING  Incomplete        Code Status Orders        Start     Ordered   03/08/16 1915  Full code   Continuous     03/08/16 1918    Code Status History    Date Active Date Inactive Code Status Order ID Comments User Context   This patient has a current code status but no historical code status.          Follow-up Information    Follow up with VINES,DAIN, MD In 7 days.   Specialty:  Family Medicine   Contact information:   [place] [address] [phone]       Follow up with primary pulmonalgist In 7 days.      Discharge Medications     Medication List    TAKE these medications        albuterol 108 (90 Base) MCG/ACT inhaler   Commonly known as:  PROVENTIL HFA;VENTOLIN HFA  Inhale 1-2 puffs into the lungs every 4 (four) hours as needed for wheezing or shortness of breath.     atorvastatin 80 MG tablet  Commonly known as:  LIPITOR  Take 80 mg by mouth at bedtime.     azithromycin 250 MG tablet  Commonly known as:  ZITHROMAX  Take 1 tablet (250 mg total) by mouth daily.     benzonatate 100 MG capsule  Commonly known as:  TESSALON PERLES  Take 1 capsule (100 mg total) by mouth every 6 (six) hours as needed for cough.     DEXILANT 60 MG capsule  Generic drug:  dexlansoprazole  Take 60 mg by mouth daily.     fluconazole 100 MG tablet  Commonly known as:  DIFLUCAN  Take 100 mg by mouth See admin instructions. Take 100 mg on the first day of levofloxacin, then another 100 mg on the 7th day of levofloxacin     Fluticasone-Salmeterol 500-50 MCG/DOSE Aepb  Commonly known as:  ADVAIR  Inhale 1 puff into the lungs 2 (two) times daily.     guaiFENesin 600 MG 12 hr tablet  Commonly known as:  MUCINEX  Take 1,200 mg by mouth 2 (two) times daily.     ibuprofen 800 MG tablet  Commonly known as:  ADVIL,MOTRIN  Take 800 mg by mouth every 6 (six) hours as needed for mild pain or moderate pain.     ipratropium-albuterol 0.5-2.5 (3) MG/3ML Soln  Commonly known as:  DUONEB  Take 3 mLs by nebulization 3 (three) times daily as needed (for shortness of breath or wheezing).     isometheptene-acetaminophen-dichloralphenazone 65-100-325 MG capsule  Commonly known as:  MIDRIN  Take 1-2 capsules by mouth 2 (two) times daily as needed for migraine.     KAZANO 12.5-500 MG Tabs  Generic drug:  Alogliptin-Metformin HCl  Take 1 tablet by mouth 2 (two) times daily.     levofloxacin 750 MG tablet  Commonly known as:  LEVAQUIN  Take 750 mg by mouth daily. For 7 days     levothyroxine 75 MCG tablet  Commonly known as:  SYNTHROID, LEVOTHROID  Take 75 mcg by mouth daily.     lisinopril 10 MG tablet  Commonly known as:   PRINIVIL,ZESTRIL  Take 1 tablet by mouth daily.     ondansetron 8 MG tablet  Commonly known as:  ZOFRAN  Take 8 mg by mouth every 8 (eight) hours as needed for nausea or vomiting.  predniSONE 20 MG tablet  Commonly known as:  DELTASONE  Take 2 tablets (40 mg total) by mouth daily.     predniSONE 20 MG tablet  Commonly known as:  DELTASONE  Take 3 tablets (60 mg total) by mouth daily.     predniSONE 10 MG (48) Tbpk tablet  Commonly known as:  STERAPRED UNI-PAK 48 TAB  Take by mouth See admin instructions. For 12 days     SPIRIVA HANDIHALER 18 MCG inhalation capsule  Generic drug:  tiotropium  Take 18 mcg by mouth daily.     Vitamin D 2000 units tablet  Take 2,000 Units by mouth daily.           Total Time in preparing paper work, data evaluation and todays exam - 35 minutes  Dustin Flock M.D on 03/09/2016 at 12:10 PM  [facility]   Office  [phone]

## 2016-03-09 NOTE — Discharge Instructions (Signed)

## 2016-03-09 NOTE — Plan of Care (Signed)
Problem: Education: Goal: Knowledge of [facility] Education information/materials will improve Outcome: Progressing Pt likes to be called Autumn Ramos    Past Medical History   Diagnosis  Date   .  Breast cancer ([place])     .  COPD (chronic obstructive pulmonary disease) ([place])     .  Hypertension     .  Last menstrual period (LMP) > 10 days ago  2006   .  H/O breast implant     .  Diabetes mellitus without complication ([place])            Pt is well controlled with home medications

## 2016-03-09 NOTE — Progress Notes (Signed)
Inpatient Diabetes Program Recommendations  AACE/ADA: New Consensus Statement on Inpatient Glycemic Control (2015)  Target Ranges:  Prepandial:   less than 140 mg/dL      Peak postprandial:   less than 180 mg/dL (1-2 hours)      Critically ill patients:  140 - 180 mg/dL   Review of Glycemic Control  Results for Autumn Ramos, Autumn Ramos (MRN [id number]) as of 03/09/2016 11:57  Ref. Range 03/08/2016 20:57 03/09/2016 07:45 03/09/2016 11:15  Glucose-Capillary Latest Ref Range: 65-99 mg/dL 282 (H) 134 (H) 210 (H)    Diabetes history: Type 2 Outpatient Diabetes medications: Alogliptin-Metformin 12.5-500mg  1 bid Current orders for Inpatient glycemic control: Tradjenta 5mg  bid, Metformin 500mg  bid, Novolog 0-15 units tid, *steroids q12h  Inpatient Diabetes Program Recommendations:  Consider adding Novolog 3 units tid with meals- continue Novolog correction as ordered.  Decrease as steroids are tapered.   Gentry Fitz, RN, BA, MHA, CDE Diabetes Coordinator Inpatient Diabetes Program  [phone] (Team Pager) [phone] ([place]) 03/09/2016 12:02 PM

## 2016-03-09 NOTE — Care Management (Addendum)
Placed in observation for bronchitis.  Room air sats are > 92.  she has home nebulizer and is followed by pulmonary at [facility].  She has been treated as an outpatient but her sx have not resolved.  She does have dx of copd and asthma.  Works full time.  Denies issues accessing medical care or obtaining her medications.  Discussed the need for exertional room air sats prior to discharge and to notify CM if 88% or less.  She denies issues accessing medical care or obtaining her medications.  At present, no discharge needs present

## 2016-03-13 LAB — CULTURE, RESPIRATORY W GRAM STAIN: Special Requests: NORMAL

## 2016-03-13 LAB — CULTURE, RESPIRATORY: CULTURE: NORMAL

## 2016-08-21 ENCOUNTER — Inpatient Hospital Stay: Payer: 59 | Attending: Oncology

## 2016-08-21 ENCOUNTER — Other Ambulatory Visit: Payer: 59

## 2016-08-21 DIAGNOSIS — Z853 Personal history of malignant neoplasm of breast: Secondary | ICD-10-CM | POA: Diagnosis present

## 2016-08-21 DIAGNOSIS — Z9882 Breast implant status: Secondary | ICD-10-CM | POA: Insufficient documentation

## 2016-08-21 DIAGNOSIS — Z9013 Acquired absence of bilateral breasts and nipples: Secondary | ICD-10-CM | POA: Diagnosis not present

## 2016-08-21 DIAGNOSIS — E119 Type 2 diabetes mellitus without complications: Secondary | ICD-10-CM | POA: Insufficient documentation

## 2016-08-21 DIAGNOSIS — Z87891 Personal history of nicotine dependence: Secondary | ICD-10-CM | POA: Insufficient documentation

## 2016-08-21 DIAGNOSIS — J449 Chronic obstructive pulmonary disease, unspecified: Secondary | ICD-10-CM | POA: Insufficient documentation

## 2016-08-21 DIAGNOSIS — Z171 Estrogen receptor negative status [ER-]: Secondary | ICD-10-CM | POA: Diagnosis not present

## 2016-08-21 DIAGNOSIS — Z79899 Other long term (current) drug therapy: Secondary | ICD-10-CM | POA: Insufficient documentation

## 2016-08-21 DIAGNOSIS — C50912 Malignant neoplasm of unspecified site of left female breast: Secondary | ICD-10-CM

## 2016-08-21 DIAGNOSIS — I10 Essential (primary) hypertension: Secondary | ICD-10-CM | POA: Insufficient documentation

## 2016-08-22 LAB — CANCER ANTIGEN 27.29: CA 27.29: 14.1 U/mL (ref 0.0–38.6)

## 2016-08-24 ENCOUNTER — Ambulatory Visit: Payer: 59 | Admitting: Oncology

## 2016-08-27 NOTE — Progress Notes (Signed)
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  INTERVAL HISTORY: Patient returns to clinic today for routine six-month follow up for breast cancer. She continues to have issues with her implants and has an appointment with plastic surgery in December for further evaluation. She otherwise feels well and is asymptomatic. She has no neurologic complaints. She denies any pain. She denies any recent fevers or illnesses. She has a good appetite and denies weight loss. She denies any chest pain or shortness of breath. She denies any nausea, vomiting, constipation, or diarrhea. She has no urinary complaints. Patient offers no specific complaints today.   REVIEW OF SYSTEMS:   Review of Systems  Constitutional: Negative.  Negative for fever, malaise/fatigue and weight loss.  Respiratory: Negative.  Negative for cough and shortness of breath.   Cardiovascular: Negative.  Negative for chest pain and leg swelling.  Gastrointestinal: Negative.  Negative for abdominal pain.  Genitourinary: Negative.   Musculoskeletal: Negative.   Neurological: Negative.  Negative for weakness.  Psychiatric/Behavioral: Negative.  The patient is not nervous/anxious.    As per HPI. Otherwise, a complete review of systems is negatIve.   PAST MEDICAL HISTORY: Past Medical History:  Diagnosis Date  . Breast cancer ([place])   . COPD (chronic obstructive pulmonary disease) ([place])   . Diabetes mellitus without complication ([place])   . H/O breast implant   . Hypertension   . Last menstrual period (LMP) > 10 days ago 2006    PAST SURGICAL HISTORY: Past Surgical History:  Procedure Laterality Date  . BREAST ENHANCEMENT SURGERY       FAMILY HISTORY Family History  Problem Relation Age of Onset  . Coronary artery disease Father   . Coronary artery disease Mother   . Hypertension Father   . Hypertension Mother        ADVANCED DIRECTIVES:    HEALTH MAINTENANCE: Social History  Substance Use Topics  . Smoking status: Former Smoker    Packs/day: 0.25    Years: 25.00    Types: Cigarettes    Quit date: 06/14/2014  . Smokeless tobacco: Never Used  . Alcohol use No    Allergies  Allergen Reactions  . Acetylcysteine Shortness Of Breath  . Codeine Itching and Nausea And Vomiting    Current Outpatient Prescriptions  Medication Sig Dispense Refill  . albuterol (PROVENTIL HFA;VENTOLIN HFA) 108 (90 Base) MCG/ACT inhaler Inhale 1-2 puffs into the lungs every 4 (four) hours as needed for wheezing or shortness of breath.    . Alogliptin-Metformin HCl (KAZANO) 12.5-500 MG TABS Take 1 tablet by mouth 2 (two) times daily.    Marland Kitchen atorvastatin (LIPITOR) 80 MG tablet Take 80 mg by mouth at bedtime.   0  . Cholecalciferol (VITAMIN D) 2000 units tablet Take 2,000 Units by mouth daily.    Marland Kitchen dexlansoprazole (DEXILANT) 60 MG capsule Take 60 mg by mouth daily.     Marland Kitchen FLUoxetine (PROZAC) 10 MG tablet Take 10 mg by mouth daily.    . Fluticasone-Salmeterol (ADVAIR) 500-50 MCG/DOSE AEPB Inhale 1 puff into the lungs 2 (two) times daily.    Marland Kitchen ibuprofen (ADVIL,MOTRIN) 800 MG tablet Take 800 mg by mouth every 6 (six) hours as needed for mild pain or moderate  pain.     . ipratropium-albuterol (DUONEB) 0.5-2.5 (3) MG/3ML SOLN Take 3 mLs by nebulization 3 (three) times daily as needed (for shortness of breath or wheezing).     . isometheptene-acetaminophen-dichloralphenazone (MIDRIN) 65-100-325 MG capsule Take 1-2 capsules by mouth 2 (two) times daily as needed for migraine.     Marland Kitchen levothyroxine (SYNTHROID, LEVOTHROID) 75 MCG tablet Take 75 mcg by mouth daily.  0  . lisinopril (PRINIVIL,ZESTRIL) 10 MG tablet Take 1 tablet by mouth daily.      . ondansetron (ZOFRAN) 8 MG tablet Take 8 mg by mouth every 8 (eight) hours as needed for nausea or vomiting.     . tiotropium (SPIRIVA HANDIHALER) 18 MCG inhalation capsule Take 18 mcg by mouth daily.      No current facility-administered medications for this visit.     OBJECTIVE: Vitals:   08/28/16 1524  BP: 116/74  Pulse: 71  Resp: 18  Temp: 98.3 F (36.8 C)     Body mass index is 33.37 kg/m.    ECOG FS:0 - Asymptomatic  General: Well-developed, well-nourished, no acute distress. Eyes: anicteric sclera. Breasts: Patient has bilateral mastectomy with reconstruction. Exam deferred today. Lungs: Clear to auscultation bilaterally. Heart: Regular rate and rhythm. No rubs, murmurs, or gallops. Abdomen: Soft, nontender, nondistended. No organomegaly noted, normoactive bowel sounds. Musculoskeletal: No edema, cyanosis, or clubbing. Neuro: Alert, answering all questions appropriately. Cranial nerves grossly intact. Skin: No rashes or petechiae noted. Psych: Normal affect.  LAB RESULTS:  Lab Results  Component Value Date   NA 136 03/09/2016   K 4.1 03/09/2016   CL 101 03/09/2016   CO2 27 03/09/2016   GLUCOSE 214 (H) 03/09/2016   BUN 17 03/09/2016   CREATININE 0.77 03/09/2016   CALCIUM 9.2 03/09/2016   PROT 7.0 02/27/2016   ALBUMIN 3.9 02/27/2016   AST 25 02/27/2016   ALT 28 02/27/2016   ALKPHOS 76 02/27/2016   BILITOT 0.3 02/27/2016   GFRNONAA >60 03/09/2016   GFRAA >60 03/09/2016    Lab Results  Component Value Date   WBC 15.7 (H) 03/09/2016   NEUTROABS 8.0 (H) 02/27/2016   HGB 11.9 (L) 03/09/2016   HCT 36.4 03/09/2016   MCV 82.7 03/09/2016   PLT 347 03/09/2016    Lab Results  Component Value Date   LABCA2 14.1 08/21/2016    STUDIES: No results found.  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  PLAN:    1. Stage IA triple negative adenocarcinoma of the lower outer  quadrant of the left breast, LCIS of the right breast: Patient is triple negative, her tumor size was less than 0.5 cm therefore no advjuvant chemotherapy was recommended. She had bilateral mastectomy with implants on November 20, 2014 at [facility] so she did not require XRT. She does not require an aromatase inhibitor given the triple negative status of her disease. CA 27-29 continues to be WNL. No intervention is needed at this time. Return to clinic in 6 months for routine evaluation and she will have her tumor marker drawn 2-3 days prior to that visit.   Patient expressed understanding and was in agreement with this plan. She also understands that She can call clinic at any time with any questions, concerns, or complaints.   Breast cancer   Staging form: Breast, AJCC 7th Edition     Clinical stage from 05/03/2015: Stage IA (T1b, N0, M0) - Signed by Lloyd Huger,  MD on 05/03/2015   Lloyd Huger, MD   08/29/2016 9:13 AM

## 2016-08-28 ENCOUNTER — Inpatient Hospital Stay (HOSPITAL_BASED_OUTPATIENT_CLINIC_OR_DEPARTMENT_OTHER): Payer: 59 | Admitting: Oncology

## 2016-08-28 VITALS — BP 116/74 | HR 71 | Temp 98.3°F | Resp 18 | Wt 170.9 lb

## 2016-08-28 DIAGNOSIS — C50512 Malignant neoplasm of lower-outer quadrant of left female breast: Secondary | ICD-10-CM

## 2016-08-28 DIAGNOSIS — Z853 Personal history of malignant neoplasm of breast: Secondary | ICD-10-CM | POA: Diagnosis not present

## 2016-08-28 DIAGNOSIS — E119 Type 2 diabetes mellitus without complications: Secondary | ICD-10-CM

## 2016-08-28 DIAGNOSIS — Z171 Estrogen receptor negative status [ER-]: Secondary | ICD-10-CM

## 2016-08-28 DIAGNOSIS — J449 Chronic obstructive pulmonary disease, unspecified: Secondary | ICD-10-CM

## 2016-08-28 DIAGNOSIS — I10 Essential (primary) hypertension: Secondary | ICD-10-CM

## 2016-08-28 DIAGNOSIS — Z9882 Breast implant status: Secondary | ICD-10-CM

## 2016-08-28 DIAGNOSIS — Z9013 Acquired absence of bilateral breasts and nipples: Secondary | ICD-10-CM

## 2016-08-28 DIAGNOSIS — Z87891 Personal history of nicotine dependence: Secondary | ICD-10-CM

## 2016-08-28 DIAGNOSIS — Z79899 Other long term (current) drug therapy: Secondary | ICD-10-CM

## 2016-08-28 NOTE — Progress Notes (Signed)
States is feeling well. Offers no complaints. 

## 2016-11-30 IMAGING — CT CT ANGIO CHEST
2 of 6 series · 19 of 46 positions shown · IV contrast (APPLIED)
Comparison: Chest radiograph dated 03/07/2016

CLINICAL DATA: Shortness of breath x3 weeks

EXAM:
CT ANGIOGRAPHY CHEST WITH CONTRAST
TECHNIQUE: Multidetector CT imaging of the chest was performed using the
standard protocol during bolus administration of intravenous
contrast. Multiplanar CT image reconstructions and MIPs were
obtained to evaluate the vascular anatomy.
CONTRAST:  100 mL Isovue 370 IV

[Series 5: pe thins 1.5 · axial · 0.68mm/px · z∈[-136,+125]mm · 16 of 243 slices shown]
[im 13/243  lung]
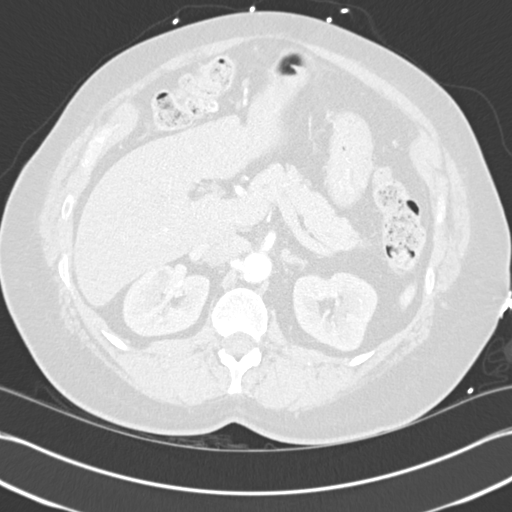
[im 26/243  soft-tissue]
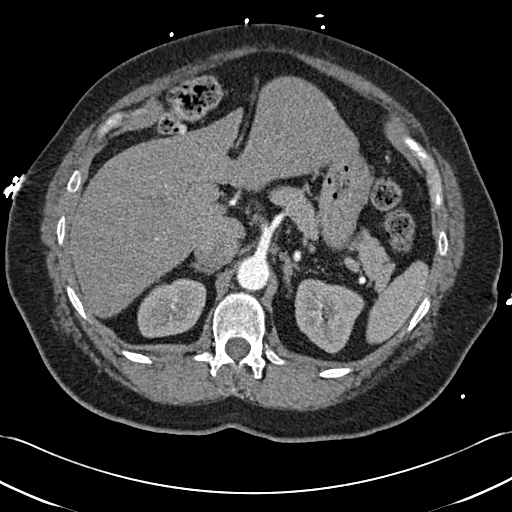
[im 39/243  lung]
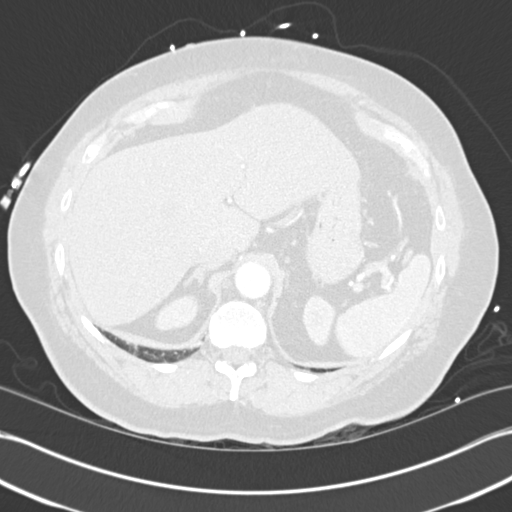
[im 51/243  soft-tissue]
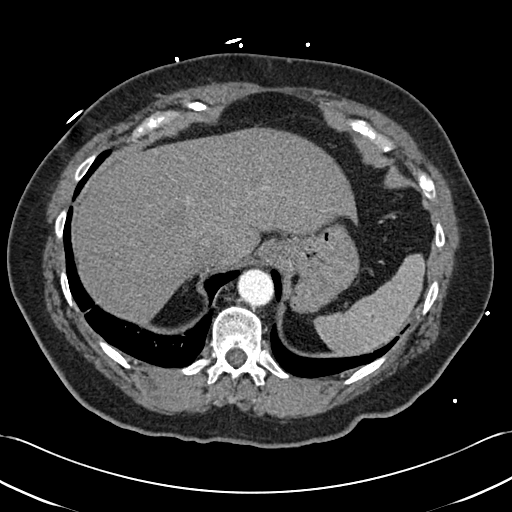
[im 77/243  lung]
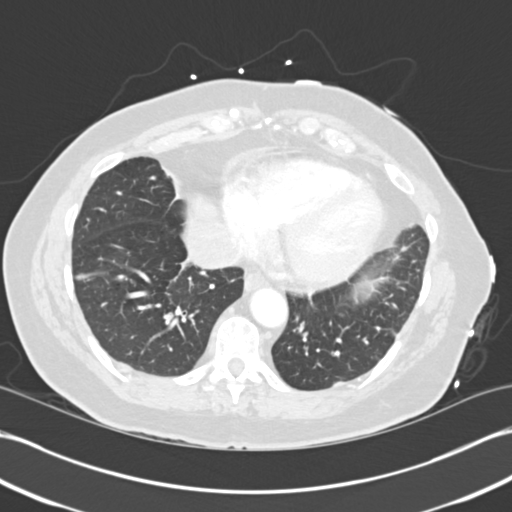
[im 90/243  soft-tissue]
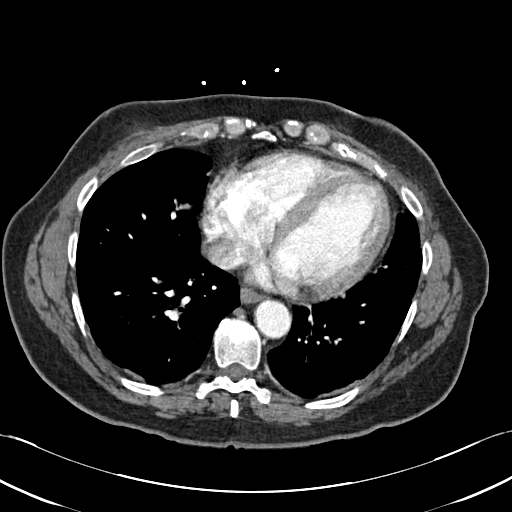
[im 102/243  lung]
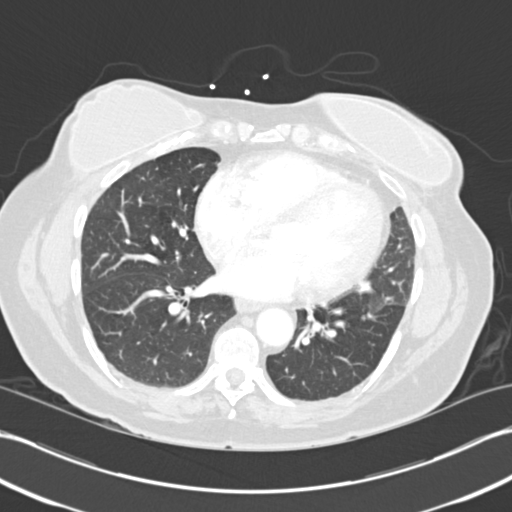
[im 115/243  soft-tissue]
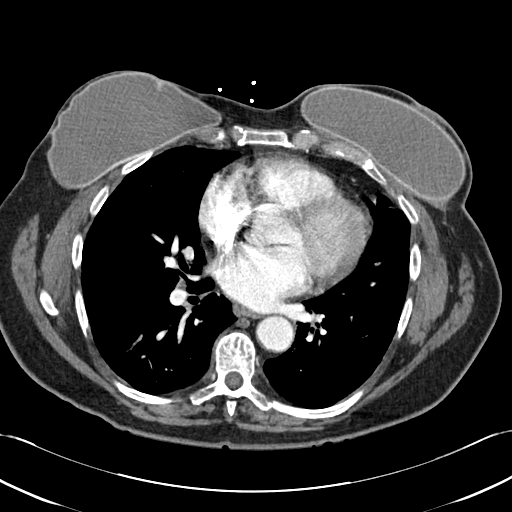
[im 128/243  lung]
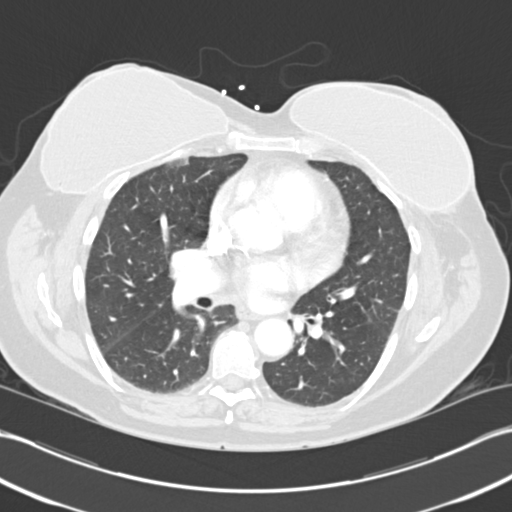
[im 141/243  soft-tissue]
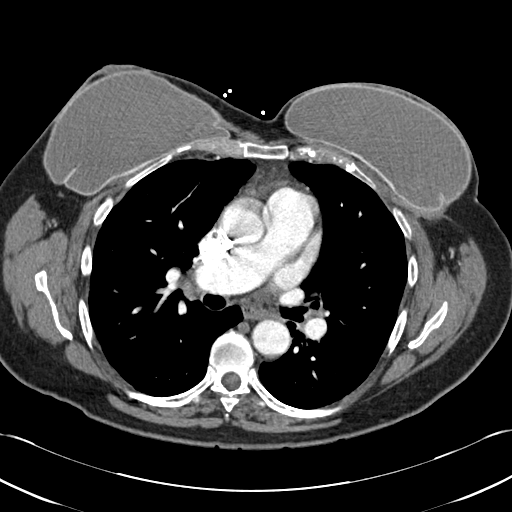
[im 153/243  lung]
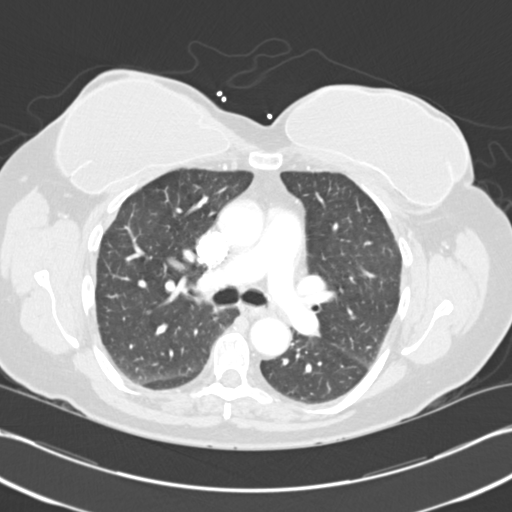
[im 166/243  soft-tissue]
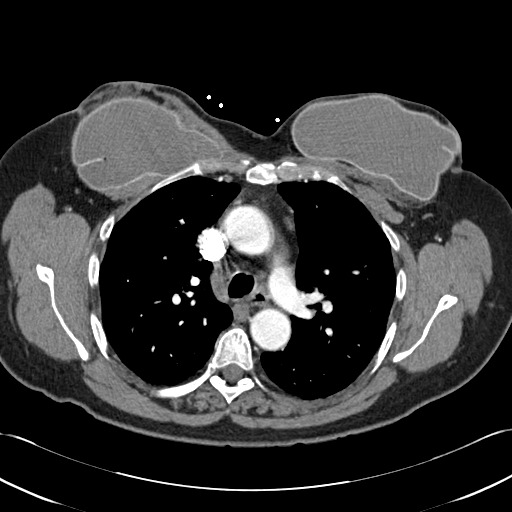
[im 192/243  lung]
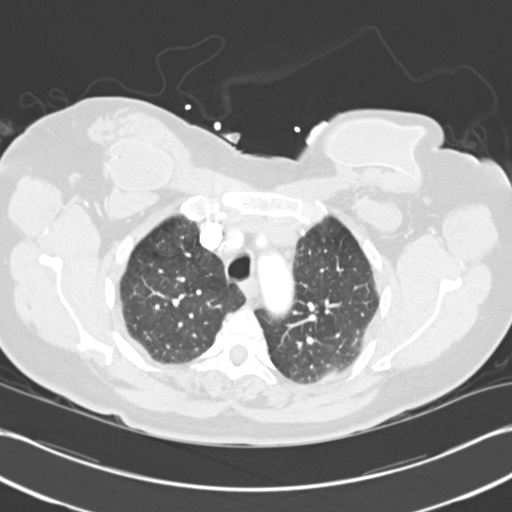
[im 204/243  soft-tissue]
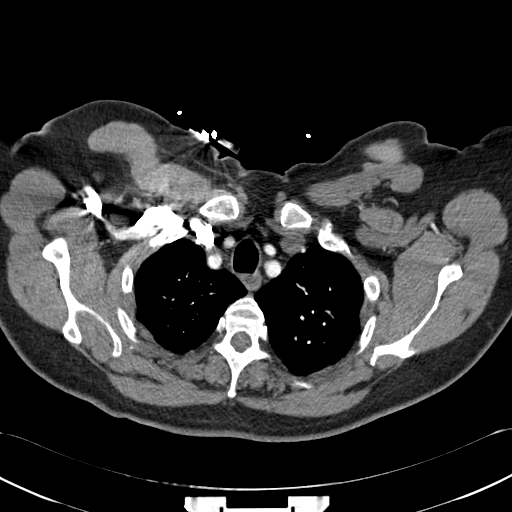
[im 217/243  lung]
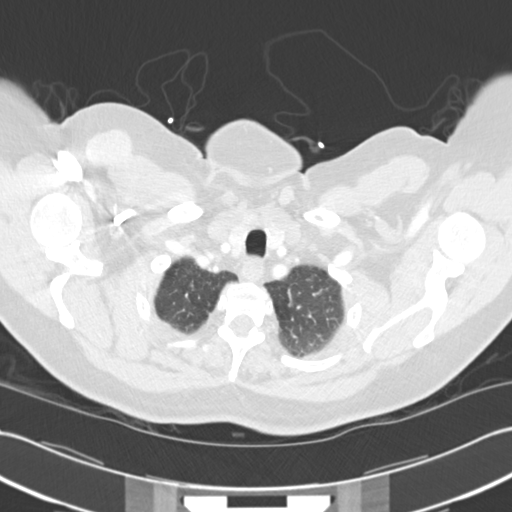
[im 230/243  soft-tissue]
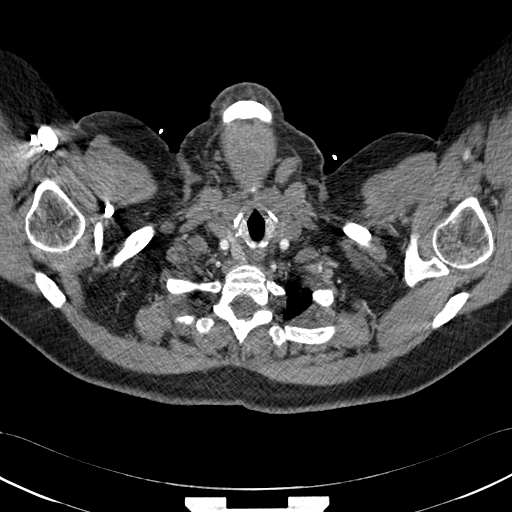

[Series 7: cor mpr 2.0 · coronal · 0.61mm/px · 3 of 152 slices shown]
[im 38/152  soft-tissue]
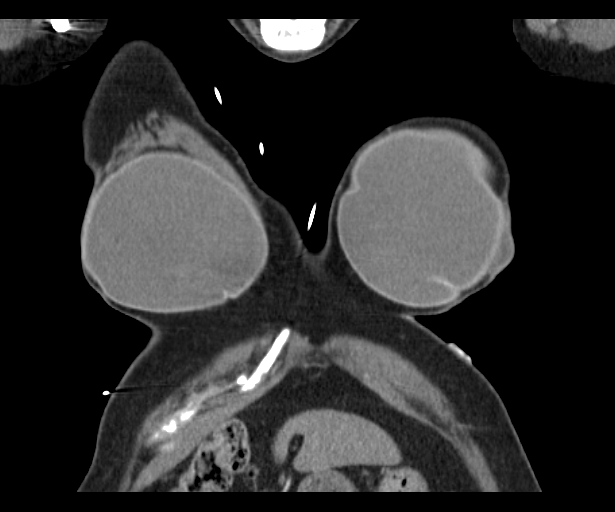
[im 76/152  soft-tissue]
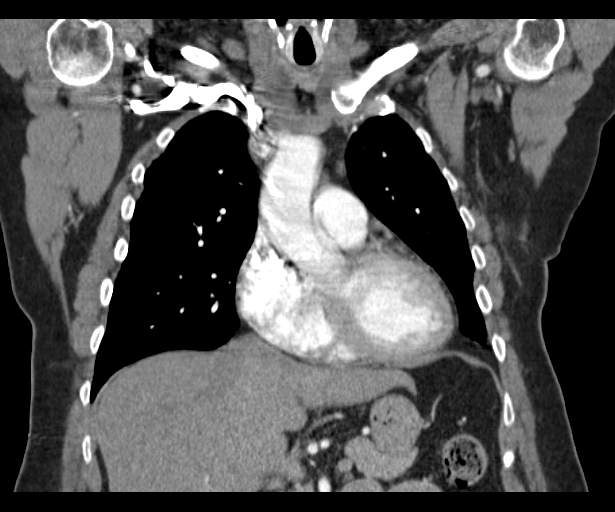
[im 114/152  soft-tissue]
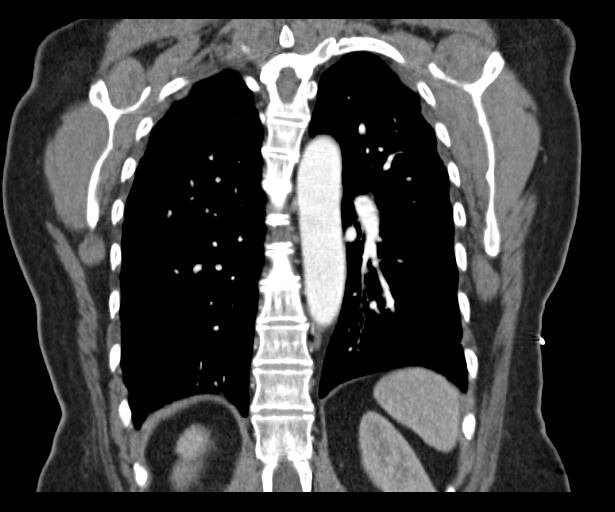

[19 of 46 positions shown; findings below may reference images not displayed]

FINDINGS: No evidence of pulmonary embolism.

Mediastinum/Nodes: Heart is normal in size. No pericardial effusion.

No evidence of thoracic aortic aneurysm.

No suspicious mediastinal lymphadenopathy.

Visualized left thyroid is mildly nodular.

Lungs/Pleura: No suspicious pulmonary nodules.

Mild mosaic attenuation.

Mild subpleural patchy opacity anteriorly in the right upper lobe
(series 6/ image 72), likely reflecting subpleural atelectasis.

Mild patchy opacity/ atelectasis in the medial right middle lobe
(series 6/ image 99).

No focal consolidation.

No pleural effusion or pneumothorax.

Upper abdomen: Visualized upper abdomen is unremarkable.

Musculoskeletal: Bilateral breast augmentation.

Mild degenerative changes of the visualized thoracolumbar spine.

Review of the MIP images confirms the above findings.
IMPRESSION: No evidence of pulmonary embolism.

No evidence of acute cardiopulmonary disease.

## 2017-02-26 ENCOUNTER — Inpatient Hospital Stay: Payer: 59 | Attending: Oncology

## 2017-02-26 DIAGNOSIS — Z9882 Breast implant status: Secondary | ICD-10-CM | POA: Insufficient documentation

## 2017-02-26 DIAGNOSIS — Z171 Estrogen receptor negative status [ER-]: Secondary | ICD-10-CM | POA: Insufficient documentation

## 2017-02-26 DIAGNOSIS — C50512 Malignant neoplasm of lower-outer quadrant of left female breast: Secondary | ICD-10-CM

## 2017-02-26 DIAGNOSIS — Z853 Personal history of malignant neoplasm of breast: Secondary | ICD-10-CM | POA: Insufficient documentation

## 2017-02-26 DIAGNOSIS — Z9013 Acquired absence of bilateral breasts and nipples: Secondary | ICD-10-CM | POA: Insufficient documentation

## 2017-02-27 LAB — CANCER ANTIGEN 27.29: CA 27.29: 16.9 U/mL (ref 0.0–38.6)

## 2017-02-28 ENCOUNTER — Ambulatory Visit: Payer: 59 | Admitting: Oncology

## 2017-03-01 ENCOUNTER — Encounter: Payer: Self-pay | Admitting: Genetic Counselor

## 2017-03-04 NOTE — Progress Notes (Signed)
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  INTERVAL HISTORY: Patient returns to clinic today for routine six-month follow up for breast cancer. She had additional plastic surgery in December 2017 to address issues with her implants which have now resolved. She currently feels well and is asymptomatic. She has no neurologic complaints. She denies any pain. She denies any recent fevers or illnesses. She has a good appetite and denies weight loss. She denies any chest pain or shortness of breath. She denies any nausea, vomiting, constipation, or diarrhea. She has no urinary complaints. Patient offers no specific complaints today.   REVIEW OF SYSTEMS:   Review of Systems  Constitutional: Negative.  Negative for fever, malaise/fatigue and weight loss.  Respiratory: Negative.  Negative for cough and shortness of breath.   Cardiovascular: Negative.  Negative for chest pain and leg swelling.  Gastrointestinal: Negative.  Negative for abdominal pain.  Genitourinary: Negative.   Musculoskeletal: Negative.   Neurological: Negative.  Negative for weakness.  Psychiatric/Behavioral: Negative.  The patient is not nervous/anxious.    As per HPI. Otherwise, a complete review of systems is negative.   PAST MEDICAL HISTORY: Past Medical History:  Diagnosis Date  . Breast cancer ([place])   . COPD (chronic obstructive pulmonary disease) ([place])   . Diabetes mellitus without complication ([place])   . H/O breast implant   . Hypertension   . Last menstrual period (LMP) > 10 days ago 2006    PAST SURGICAL HISTORY: Past Surgical History:  Procedure Laterality Date  . BREAST ENHANCEMENT SURGERY       FAMILY HISTORY Family History  Problem Relation Age of Onset  . Coronary artery disease Father   . Hypertension Father   . Coronary artery disease Mother   . Hypertension Mother        ADVANCED DIRECTIVES:    HEALTH MAINTENANCE: Social History  Substance Use Topics  . Smoking status: Former Smoker    Packs/day: 0.25    Years: 25.00    Types: Cigarettes    Quit date: 06/14/2014  . Smokeless tobacco: Never Used  . Alcohol use No    Allergies  Allergen Reactions  . Acetylcysteine Shortness Of Breath  . Codeine Itching and Nausea And Vomiting    Current Outpatient Prescriptions  Medication Sig Dispense Refill  . albuterol (PROVENTIL HFA;VENTOLIN HFA) 108 (90 Base) MCG/ACT inhaler Inhale 1-2 puffs into the lungs every 4 (four) hours as needed for wheezing or shortness of breath.    . Alogliptin-Metformin HCl (KAZANO) 12.5-500 MG TABS Take 1 tablet by mouth 2 (two) times daily.    Marland Kitchen atorvastatin (LIPITOR) 80 MG tablet Take 80 mg by mouth at bedtime.   0  . Cholecalciferol (VITAMIN D) 2000 units tablet Take 2,000 Units by mouth daily.    Marland Kitchen dexlansoprazole (DEXILANT) 60 MG capsule Take 60 mg by mouth daily.     . Fluticasone-Salmeterol (ADVAIR) 500-50 MCG/DOSE AEPB Inhale 1 puff into the lungs 2 (two) times daily.    Marland Kitchen ibuprofen (ADVIL,MOTRIN) 800 MG tablet Take 800 mg by mouth every 6 (six) hours as needed for mild pain or moderate pain.     Marland Kitchen ipratropium-albuterol (DUONEB) 0.5-2.5 (3) MG/3ML SOLN Take 3 mLs by nebulization  3 (three) times daily as needed (for shortness of breath or wheezing).     . isometheptene-acetaminophen-dichloralphenazone (MIDRIN) 65-100-325 MG capsule Take 1-2 capsules by mouth 2 (two) times daily as needed for migraine.     Marland Kitchen levothyroxine (SYNTHROID, LEVOTHROID) 100 MCG tablet Take 100 mcg by mouth daily before breakfast.    . lisinopril (PRINIVIL,ZESTRIL) 10 MG tablet Take 1 tablet by mouth daily.    . ondansetron (ZOFRAN) 8 MG tablet Take 8 mg by  mouth every 8 (eight) hours as needed for nausea or vomiting.     . tiotropium (SPIRIVA HANDIHALER) 18 MCG inhalation capsule Take 18 mcg by mouth daily.      No current facility-administered medications for this visit.     OBJECTIVE: Vitals:   03/05/17 1447  BP: 127/77  Pulse: 86  Resp: 18  Temp: 98.7 F (37.1 C)     Body mass index is 36.15 kg/m.    ECOG FS:0 - Asymptomatic  General: Well-developed, well-nourished, no acute distress. Eyes: anicteric sclera. Breasts: Patient has bilateral mastectomy with reconstruction. Exam deferred today. Lungs: Clear to auscultation bilaterally. Heart: Regular rate and rhythm. No rubs, murmurs, or gallops. Abdomen: Soft, nontender, nondistended. No organomegaly noted, normoactive bowel sounds. Musculoskeletal: No edema, cyanosis, or clubbing. Neuro: Alert, answering all questions appropriately. Cranial nerves grossly intact. Skin: No rashes or petechiae noted. Psych: Normal affect.  LAB RESULTS:  Lab Results  Component Value Date   NA 136 03/09/2016   K 4.1 03/09/2016   CL 101 03/09/2016   CO2 27 03/09/2016   GLUCOSE 214 (H) 03/09/2016   BUN 17 03/09/2016   CREATININE 0.77 03/09/2016   CALCIUM 9.2 03/09/2016   PROT 7.0 02/27/2016   ALBUMIN 3.9 02/27/2016   AST 25 02/27/2016   ALT 28 02/27/2016   ALKPHOS 76 02/27/2016   BILITOT 0.3 02/27/2016   GFRNONAA >60 03/09/2016   GFRAA >60 03/09/2016    Lab Results  Component Value Date   WBC 15.7 (H) 03/09/2016   NEUTROABS 8.0 (H) 02/27/2016   HGB 11.9 (L) 03/09/2016   HCT 36.4 03/09/2016   MCV 82.7 03/09/2016   PLT 347 03/09/2016    Lab Results  Component Value Date   LABCA2 16.9 02/26/2017    STUDIES: No results found.  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  PLAN:    1. 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 She had bilateral mastectomy with implants on November 20, 2014 at [facility] so she did not require XRT. She does not require an aromatase inhibitor given the triple negative status of her disease and the benefit for her LCIS was questionable after mastectomy. CA 27-29 continues to be WNL. No intervention is needed at this time. Return to clinic in 1 year for repeat laboratory work and further evaluation.   Patient expressed understanding and was in agreement with this plan. She also understands that She can call clinic at any time with any questions, concerns, or complaints.   Breast cancer   Staging form: Breast, AJCC 7th Edition     Clinical stage from 05/03/2015: Stage IA (T1b, N0, M0) - Signed by Lloyd Huger, MD on 05/03/2015   Lloyd Huger, MD   03/05/2017 5:30 PM

## 2017-03-05 ENCOUNTER — Inpatient Hospital Stay: Payer: 59 | Attending: Oncology | Admitting: Oncology

## 2017-03-05 VITALS — BP 127/77 | HR 86 | Temp 98.7°F | Resp 18 | Wt 185.1 lb

## 2017-03-05 DIAGNOSIS — J449 Chronic obstructive pulmonary disease, unspecified: Secondary | ICD-10-CM

## 2017-03-05 DIAGNOSIS — Z87891 Personal history of nicotine dependence: Secondary | ICD-10-CM | POA: Diagnosis not present

## 2017-03-05 DIAGNOSIS — C50512 Malignant neoplasm of lower-outer quadrant of left female breast: Secondary | ICD-10-CM

## 2017-03-05 DIAGNOSIS — Z171 Estrogen receptor negative status [ER-]: Secondary | ICD-10-CM | POA: Insufficient documentation

## 2017-03-05 DIAGNOSIS — Z853 Personal history of malignant neoplasm of breast: Secondary | ICD-10-CM | POA: Diagnosis not present

## 2017-03-05 DIAGNOSIS — E119 Type 2 diabetes mellitus without complications: Secondary | ICD-10-CM | POA: Insufficient documentation

## 2017-03-05 DIAGNOSIS — Z9882 Breast implant status: Secondary | ICD-10-CM

## 2017-03-05 DIAGNOSIS — Z79899 Other long term (current) drug therapy: Secondary | ICD-10-CM | POA: Diagnosis not present

## 2017-03-05 DIAGNOSIS — I10 Essential (primary) hypertension: Secondary | ICD-10-CM | POA: Insufficient documentation

## 2017-03-05 DIAGNOSIS — Z9013 Acquired absence of bilateral breasts and nipples: Secondary | ICD-10-CM

## 2017-03-05 NOTE — Progress Notes (Signed)
Patient is here for follow up, no complaints  

## 2017-05-14 ENCOUNTER — Ambulatory Visit
Admission: EM | Admit: 2017-05-14 | Discharge: 2017-05-14 | Disposition: A | Discharge status: Home / Self Care | Payer: BLUE CROSS/BLUE SHIELD | Attending: Family Medicine | Admitting: Family Medicine

## 2017-05-14 DIAGNOSIS — J441 Chronic obstructive pulmonary disease with (acute) exacerbation: Secondary | ICD-10-CM

## 2017-05-14 DIAGNOSIS — R05 Cough: Secondary | ICD-10-CM | POA: Diagnosis not present

## 2017-05-14 DIAGNOSIS — R0602 Shortness of breath: Secondary | ICD-10-CM

## 2017-05-14 MED ORDER — PREDNISONE 20 MG PO TABS: ORAL_TABLET | ORAL | 0 refills | Status: DC

## 2017-05-14 MED ORDER — BENZONATATE 200 MG PO CAPS: 200.0000 mg | ORAL_CAPSULE | Freq: Three times a day (TID) | ORAL | 0 refills | Status: DC

## 2017-05-14 MED ORDER — AZITHROMYCIN 250 MG PO TABS: ORAL_TABLET | ORAL | 0 refills | Status: DC

## 2017-05-14 NOTE — ED Provider Notes (Addendum)
CSN: [id number]     Arrival date & time 05/14/17  1126 History   First MD Initiated Contact with Patient 05/14/17 1211     Chief Complaint  Patient presents with  . COPD   (Consider location/radiation/quality/duration/timing/severity/associated sxs/prior Treatment) HPI  This a 61 year old female who presents with a cough productive of green sputum congestion shortness of breath O2 sats at 95% on room air she started having last Wednesday 5 days prior to this visit. Has a history of advanced COPD. Last Year she had a long episode of COPD that eventually ended up with pneumonia and hospitalization. Is not smoke anymore. She has been using her nebulizer and aerosols at home    Past Medical History:  Diagnosis Date  . Breast cancer ([place])   . COPD (chronic obstructive pulmonary disease) ([place])   . Diabetes mellitus without complication ([place])   . H/O breast implant   . Hypertension   . Last menstrual period (LMP) > 10 days ago 2006   Past Surgical History:  Procedure Laterality Date  . BREAST ENHANCEMENT SURGERY     Family History  Problem Relation Age of Onset  . Coronary artery disease Father   . Hypertension Father   . Coronary artery disease Mother   . Hypertension Mother   . Diabetes Mother    Social History  Substance Use Topics  . Smoking status: Former Smoker    Packs/day: 0.25    Years: 25.00    Types: Cigarettes    Quit date: 06/14/2014  . Smokeless tobacco: Never Used  . Alcohol use No   OB History    No data available     Review of Systems  Constitutional: Positive for activity change. Negative for appetite change, chills, fatigue and fever.  HENT: Positive for congestion, postnasal drip and rhinorrhea.   Respiratory: Positive for cough and shortness of breath. Negative for wheezing and stridor.   All other systems reviewed and are negative.   Allergies  Acetylcysteine and Codeine  Home Medications   Prior to Admission medications   Medication Sig  Start Date End Date Taking? Authorizing Provider  albuterol (PROVENTIL HFA;VENTOLIN HFA) 108 (90 Base) MCG/ACT inhaler Inhale 1-2 puffs into the lungs every 4 (four) hours as needed for wheezing or shortness of breath.   Yes [provider]  Alogliptin-Metformin HCl (KAZANO) 12.5-500 MG TABS Take 1 tablet by mouth 2 (two) times daily.   Yes [provider]  atorvastatin (LIPITOR) 80 MG tablet Take 80 mg by mouth at bedtime.    Yes [provider]  Cholecalciferol (VITAMIN D) 2000 units tablet Take 2,000 Units by mouth daily.   Yes [provider]  dexlansoprazole (DEXILANT) 60 MG capsule Take 60 mg by mouth daily.    Yes [provider]  Fluticasone-Salmeterol (ADVAIR) 500-50 MCG/DOSE AEPB Inhale 1 puff into the lungs 2 (two) times daily.   Yes [provider]  ibuprofen (ADVIL,MOTRIN) 800 MG tablet Take 800 mg by mouth every 6 (six) hours as needed for mild pain or moderate pain.    Yes [provider]  ipratropium-albuterol (DUONEB) 0.5-2.5 (3) MG/3ML SOLN Take 3 mLs by nebulization 3 (three) times daily as needed (for shortness of breath or wheezing).    Yes [provider]  isometheptene-acetaminophen-dichloralphenazone (MIDRIN) 65-100-325 MG capsule Take 1-2 capsules by mouth 2 (two) times daily as needed for migraine.    Yes [provider]  levothyroxine (SYNTHROID, LEVOTHROID) 100 MCG tablet Take 100 mcg by mouth daily before  breakfast.   Yes [provider]  lisinopril (PRINIVIL,ZESTRIL) 10 MG tablet Take 1 tablet by mouth daily.   Yes [provider]  ondansetron (ZOFRAN) 8 MG tablet Take 8 mg by mouth every 8 (eight) hours as needed for nausea or vomiting.    Yes [provider]  tiotropium (SPIRIVA HANDIHALER) 18 MCG inhalation capsule Take 18 mcg by mouth daily.  06/12/11  Yes [provider]  azithromycin (ZITHROMAX Z-PAK) 250 MG tablet Use as per package instructions  05/14/17   Lorin Picket, PA-C  benzonatate (TESSALON) 200 MG capsule Take 1 capsule (200 mg total) by mouth every 8 (eight) hours. For cough 05/14/17   Lorin Picket, PA-C  predniSONE (DELTASONE) 20 MG tablet Take 2 tablets (40 mg) daily by mouth 05/14/17   Lorin Picket, PA-C   Meds Ordered and Administered this Visit  Medications - No data to display  BP (!) 126/54 (BP Location: Left Arm)   Pulse 99   Temp 99.2 F (37.3 C) (Oral)   Resp 18   Ht 5' (1.524 m)   Wt 165 lb (74.8 kg)   SpO2 95%   BMI 32.22 kg/m  No data found.   Physical Exam  Constitutional: She is oriented to person, place, and time. She appears well-developed and well-nourished. No distress.  HENT:  Head: Normocephalic.  Eyes: Pupils are equal, round, and reactive to light.  Neck: Normal range of motion.  Pulmonary/Chest: Effort normal and breath sounds normal. No respiratory distress. She has no wheezes. She has no rales.  Musculoskeletal: Normal range of motion.  Neurological: She is alert and oriented to person, place, and time.  Skin: Skin is warm and dry. She is not diaphoretic.  Psychiatric: She has a normal mood and affect. Her behavior is normal. Judgment and thought content normal.  Nursing note and vitals reviewed.   Urgent Care Course     Procedures (including critical care time)  Labs Review Labs Reviewed - No data to display  Imaging Review No results found.   Visual Acuity Review  Right Eye Distance:   Left Eye Distance:   Bilateral Distance:    Right Eye Near:   Left Eye Near:    Bilateral Near:         MDM   1. COPD exacerbation [facility])    Discharge Medication List as of 05/14/2017 12:29 PM    START taking these medications   Details  azithromycin (ZITHROMAX Z-PAK) 250 MG tablet Use as per package instructions, Normal    benzonatate (TESSALON) 200 MG capsule Take 1 capsule (200 mg total) by mouth every 8 (eight) hours. For cough, Starting Mon 05/14/2017,  Normal    predniSONE (DELTASONE) 20 MG tablet Take 2 tablets (40 mg) daily by mouth, Normal      Plan: 1. Test/x-ray results and diagnosis reviewed with patient 2. rx as per orders; risks, benefits, potential side effects reviewed with patient 3. Recommend supportive treatment with Continued use of her nebulizers and aerosols as necessary. If she is not improving she should follow-up with her primary care physician. 4. F/u prn if symptoms worsen or don't improve     Lorin Picket, PA-C 05/14/17 1242    Lorin Picket, PA-C 05/14/17 1243

## 2017-05-14 NOTE — ED Triage Notes (Signed)
Patient complains of cough, congestion, shortness of breath, productive cough- green. Patient states that she started feeling bad last Wednesday. Patient states that last year she had pneumonia-hospitalized.

## 2018-01-16 DIAGNOSIS — Z9013 Acquired absence of bilateral breasts and nipples: Secondary | ICD-10-CM | POA: Insufficient documentation

## 2018-03-04 ENCOUNTER — Inpatient Hospital Stay: Payer: BLUE CROSS/BLUE SHIELD | Attending: Oncology

## 2018-03-04 DIAGNOSIS — E119 Type 2 diabetes mellitus without complications: Secondary | ICD-10-CM | POA: Diagnosis not present

## 2018-03-04 DIAGNOSIS — Z9882 Breast implant status: Secondary | ICD-10-CM | POA: Diagnosis not present

## 2018-03-04 DIAGNOSIS — Z7952 Long term (current) use of systemic steroids: Secondary | ICD-10-CM | POA: Insufficient documentation

## 2018-03-04 DIAGNOSIS — J449 Chronic obstructive pulmonary disease, unspecified: Secondary | ICD-10-CM | POA: Insufficient documentation

## 2018-03-04 DIAGNOSIS — C50512 Malignant neoplasm of lower-outer quadrant of left female breast: Secondary | ICD-10-CM

## 2018-03-04 DIAGNOSIS — Z9013 Acquired absence of bilateral breasts and nipples: Secondary | ICD-10-CM | POA: Diagnosis not present

## 2018-03-04 DIAGNOSIS — Z171 Estrogen receptor negative status [ER-]: Secondary | ICD-10-CM | POA: Insufficient documentation

## 2018-03-04 DIAGNOSIS — I10 Essential (primary) hypertension: Secondary | ICD-10-CM | POA: Insufficient documentation

## 2018-03-04 DIAGNOSIS — Z853 Personal history of malignant neoplasm of breast: Secondary | ICD-10-CM | POA: Diagnosis present

## 2018-03-04 DIAGNOSIS — Z87891 Personal history of nicotine dependence: Secondary | ICD-10-CM | POA: Diagnosis not present

## 2018-03-04 DIAGNOSIS — Z86000 Personal history of in-situ neoplasm of breast: Secondary | ICD-10-CM | POA: Insufficient documentation

## 2018-03-04 DIAGNOSIS — Z79899 Other long term (current) drug therapy: Secondary | ICD-10-CM | POA: Insufficient documentation

## 2018-03-05 LAB — CANCER ANTIGEN 27.29: CA 27.29: 10.4 U/mL (ref 0.0–38.6)

## 2018-03-10 NOTE — Progress Notes (Signed)
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  INTERVAL HISTORY: Patient returns to clinic today for repeat laboratory work and routine yearly evaluation.  She had another revision of her left breast implant recently, but now states she has completed all of her plastic surgery.  She currently feels well and is asymptomatic. She has no neurologic complaints. She denies any pain. She denies any recent fevers or illnesses. She has a good appetite and denies weight loss. She denies any chest pain or shortness of breath. She denies any nausea, vomiting, constipation, or diarrhea. She has no urinary complaints.  Patient feels that her baseline offers no specific complaints today.  REVIEW OF SYSTEMS:   Review of Systems  Constitutional: Negative.  Negative for fever, malaise/fatigue and weight loss.  Respiratory: Negative.  Negative for cough and shortness of breath.   Cardiovascular: Negative.  Negative for chest pain and leg swelling.  Gastrointestinal: Negative.  Negative for abdominal pain and constipation.  Genitourinary: Negative.  Negative for dysuria.  Musculoskeletal: Negative.  Negative for back pain.  Neurological: Negative.  Negative for sensory change, focal weakness and weakness.  Psychiatric/Behavioral: Negative.  The patient is not nervous/anxious.    As per HPI. Otherwise, a complete review of systems is negative.   PAST MEDICAL HISTORY: Past Medical History:  Diagnosis Date  . Breast cancer ([place])   . COPD (chronic obstructive pulmonary disease) ([place])   . Diabetes mellitus without complication ([place])   . H/O breast implant   . Hypertension   . Last menstrual period (LMP) > 10  days ago 2006    PAST SURGICAL HISTORY: Past Surgical History:  Procedure Laterality Date  . BREAST ENHANCEMENT SURGERY      FAMILY HISTORY Family History  Problem Relation Age of Onset  . Coronary artery disease Father   . Hypertension Father   . Coronary artery disease Mother   . Hypertension Mother   . Diabetes Mother        ADVANCED DIRECTIVES:    HEALTH MAINTENANCE: Social History   Tobacco Use  . Smoking status: Former Smoker    Packs/day: 0.25    Years: 25.00    Pack years: 6.25    Types: Cigarettes    Last attempt to quit: 06/14/2014    Years since quitting: 3.7  . Smokeless tobacco: Never Used  Substance Use Topics  . Alcohol use: No  . Drug use: No    Allergies  Allergen Reactions  . Acetylcysteine Shortness Of Breath  . Codeine Itching and Nausea And Vomiting    Current Outpatient Medications  Medication Sig Dispense Refill  . atorvastatin (LIPITOR) 80 MG tablet Take 80 mg by mouth at bedtime.   0  . Cholecalciferol (VITAMIN D) 2000 units tablet Take 2,000 Units by mouth daily.    Marland Kitchen dexlansoprazole (DEXILANT) 60 MG capsule Take 60 mg by mouth daily.     . Fluticasone-Salmeterol (ADVAIR) 500-50 MCG/DOSE AEPB Inhale 1 puff into the lungs 2 (two) times daily.    Marland Kitchen levothyroxine (SYNTHROID, LEVOTHROID) 100 MCG tablet Take 100 mcg by mouth daily before breakfast.    . lisinopril (PRINIVIL,ZESTRIL) 10 MG tablet Take 1 tablet by mouth daily.    Marland Kitchen albuterol (PROVENTIL HFA;VENTOLIN  HFA) 108 (90 Base) MCG/ACT inhaler Inhale 1-2 puffs into the lungs every 4 (four) hours as needed for wheezing or shortness of breath.    . Alogliptin-Metformin HCl (KAZANO) 12.5-500 MG TABS Take 1 tablet by mouth 2 (two) times daily.    Marland Kitchen azithromycin (ZITHROMAX Z-PAK) 250 MG tablet Use as per package instructions (Patient not taking: Reported on 03/11/2018) 1 each 0  . benzonatate (TESSALON) 200 MG capsule Take 1 capsule (200 mg total) by mouth every 8 (eight) hours. For cough  (Patient not taking: Reported on 03/11/2018) 21 capsule 0  . ibuprofen (ADVIL,MOTRIN) 800 MG tablet Take 800 mg by mouth every 6 (six) hours as needed for mild pain or moderate pain.     Marland Kitchen ipratropium-albuterol (DUONEB) 0.5-2.5 (3) MG/3ML SOLN Take 3 mLs by nebulization 3 (three) times daily as needed (for shortness of breath or wheezing).     . isometheptene-acetaminophen-dichloralphenazone (MIDRIN) 65-100-325 MG capsule Take 1-2 capsules by mouth 2 (two) times daily as needed for migraine.     . ondansetron (ZOFRAN) 8 MG tablet Take 8 mg by mouth every 8 (eight) hours as needed for nausea or vomiting.     . predniSONE (DELTASONE) 20 MG tablet Take 2 tablets (40 mg) daily by mouth (Patient not taking: Reported on 03/11/2018) 8 tablet 0  . tiotropium (SPIRIVA HANDIHALER) 18 MCG inhalation capsule Take 18 mcg by mouth daily.      No current facility-administered medications for this visit.     OBJECTIVE: Vitals:   03/11/18 1438  BP: 106/64  Pulse: 74  Resp: 18  Temp: 97.8 F (36.6 C)  SpO2: 96%     Body mass index is 29 kg/m.    ECOG FS:0 - Asymptomatic  General: Well-developed, well-nourished, no acute distress. Eyes: Pink conjunctiva, anicteric sclera. Breast: Bilateral mastectomy with reconstruction. Lungs: Clear to auscultation bilaterally. Heart: Regular rate and rhythm. No rubs, murmurs, or gallops. Abdomen: Soft, nontender, nondistended. No organomegaly noted, normoactive bowel sounds. Musculoskeletal: No edema, cyanosis, or clubbing. Neuro: Alert, answering all questions appropriately. Cranial nerves grossly intact. Skin: No rashes or petechiae noted. Psych: Normal affect.  LAB RESULTS:  Lab Results  Component Value Date   NA 136 03/09/2016   K 4.1 03/09/2016   CL 101 03/09/2016   CO2 27 03/09/2016   GLUCOSE 214 (H) 03/09/2016   BUN 17 03/09/2016   CREATININE 0.77 03/09/2016   CALCIUM 9.2 03/09/2016   PROT 7.0 02/27/2016   ALBUMIN 3.9 02/27/2016   AST 25  02/27/2016   ALT 28 02/27/2016   ALKPHOS 76 02/27/2016   BILITOT 0.3 02/27/2016   GFRNONAA >60 03/09/2016   GFRAA >60 03/09/2016    Lab Results  Component Value Date   WBC 15.7 (H) 03/09/2016   NEUTROABS 8.0 (H) 02/27/2016   HGB 11.9 (L) 03/09/2016   HCT 36.4 03/09/2016   MCV 82.7 03/09/2016   PLT 347 03/09/2016     STUDIES: No results found.  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  PLAN:    1. 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  Patient initial diagnosis was in November 2015.  She subsequently underwent bilateral mastectomy with implants on November 20, 2014 at [facility].  She did not require adjuvant XRT.  Given the triple negative status of her cancer, she did not require treatment with an aromatase inhibitor. The benefit for her LCIS was questionable after mastectomy.  Patient CA-27-29 is 10.4 today.  No further interventions are needed.  Return to clinic in 1 year with repeat laboratory work and further evaluation.  Approximately 20 minutes was spent in discussion of which greater than 50% was consultation.  Patient expressed understanding and was in agreement with this plan. She also understands that She can call clinic at any time with any questions, concerns, or complaints.   Breast cancer   Staging form: Breast, AJCC 7th Edition     Clinical stage from 05/03/2015: Stage IA (T1b, N0, M0) - Signed by Lloyd Huger, MD on 05/03/2015   Lloyd Huger, MD   03/11/2018 3:48 PM

## 2018-03-11 ENCOUNTER — Inpatient Hospital Stay (HOSPITAL_BASED_OUTPATIENT_CLINIC_OR_DEPARTMENT_OTHER): Payer: BLUE CROSS/BLUE SHIELD | Admitting: Oncology

## 2018-03-11 ENCOUNTER — Encounter: Payer: Self-pay | Admitting: Oncology

## 2018-03-11 VITALS — BP 106/64 | HR 74 | Temp 97.8°F | Resp 18 | Ht 60.0 in | Wt 148.5 lb

## 2018-03-11 DIAGNOSIS — Z79899 Other long term (current) drug therapy: Secondary | ICD-10-CM

## 2018-03-11 DIAGNOSIS — J449 Chronic obstructive pulmonary disease, unspecified: Secondary | ICD-10-CM

## 2018-03-11 DIAGNOSIS — Z853 Personal history of malignant neoplasm of breast: Secondary | ICD-10-CM

## 2018-03-11 DIAGNOSIS — Z9882 Breast implant status: Secondary | ICD-10-CM

## 2018-03-11 DIAGNOSIS — C50512 Malignant neoplasm of lower-outer quadrant of left female breast: Secondary | ICD-10-CM

## 2018-03-11 DIAGNOSIS — Z171 Estrogen receptor negative status [ER-]: Secondary | ICD-10-CM

## 2018-03-11 DIAGNOSIS — Z9013 Acquired absence of bilateral breasts and nipples: Secondary | ICD-10-CM

## 2018-03-11 DIAGNOSIS — I10 Essential (primary) hypertension: Secondary | ICD-10-CM

## 2018-03-11 DIAGNOSIS — Z7952 Long term (current) use of systemic steroids: Secondary | ICD-10-CM

## 2018-03-11 DIAGNOSIS — E119 Type 2 diabetes mellitus without complications: Secondary | ICD-10-CM | POA: Diagnosis not present

## 2018-03-11 DIAGNOSIS — Z86000 Personal history of in-situ neoplasm of breast: Secondary | ICD-10-CM | POA: Diagnosis not present

## 2018-03-11 DIAGNOSIS — Z87891 Personal history of nicotine dependence: Secondary | ICD-10-CM | POA: Diagnosis not present

## 2018-03-11 NOTE — Progress Notes (Signed)
No new change noted today 

## 2018-04-28 ENCOUNTER — Other Ambulatory Visit: Payer: Self-pay | Admitting: Internal Medicine

## 2018-12-10 ENCOUNTER — Institutional Professional Consult (permissible substitution): Payer: BLUE CROSS/BLUE SHIELD | Admitting: Internal Medicine

## 2019-03-03 ENCOUNTER — Other Ambulatory Visit: Payer: Self-pay

## 2019-03-03 ENCOUNTER — Inpatient Hospital Stay: Payer: BLUE CROSS/BLUE SHIELD | Attending: Oncology

## 2019-03-03 DIAGNOSIS — J449 Chronic obstructive pulmonary disease, unspecified: Secondary | ICD-10-CM | POA: Insufficient documentation

## 2019-03-03 DIAGNOSIS — I10 Essential (primary) hypertension: Secondary | ICD-10-CM | POA: Insufficient documentation

## 2019-03-03 DIAGNOSIS — Z79899 Other long term (current) drug therapy: Secondary | ICD-10-CM | POA: Insufficient documentation

## 2019-03-03 DIAGNOSIS — Z86000 Personal history of in-situ neoplasm of breast: Secondary | ICD-10-CM | POA: Diagnosis not present

## 2019-03-03 DIAGNOSIS — Z9882 Breast implant status: Secondary | ICD-10-CM | POA: Diagnosis not present

## 2019-03-03 DIAGNOSIS — Z7952 Long term (current) use of systemic steroids: Secondary | ICD-10-CM | POA: Insufficient documentation

## 2019-03-03 DIAGNOSIS — E119 Type 2 diabetes mellitus without complications: Secondary | ICD-10-CM | POA: Diagnosis not present

## 2019-03-03 DIAGNOSIS — Z853 Personal history of malignant neoplasm of breast: Secondary | ICD-10-CM | POA: Insufficient documentation

## 2019-03-03 DIAGNOSIS — Z7982 Long term (current) use of aspirin: Secondary | ICD-10-CM | POA: Diagnosis not present

## 2019-03-03 DIAGNOSIS — Z791 Long term (current) use of non-steroidal anti-inflammatories (NSAID): Secondary | ICD-10-CM | POA: Insufficient documentation

## 2019-03-03 DIAGNOSIS — Z7951 Long term (current) use of inhaled steroids: Secondary | ICD-10-CM | POA: Insufficient documentation

## 2019-03-03 DIAGNOSIS — Z171 Estrogen receptor negative status [ER-]: Secondary | ICD-10-CM | POA: Insufficient documentation

## 2019-03-03 DIAGNOSIS — Z87891 Personal history of nicotine dependence: Secondary | ICD-10-CM | POA: Diagnosis not present

## 2019-03-03 DIAGNOSIS — Z9013 Acquired absence of bilateral breasts and nipples: Secondary | ICD-10-CM | POA: Diagnosis not present

## 2019-03-03 DIAGNOSIS — C50512 Malignant neoplasm of lower-outer quadrant of left female breast: Secondary | ICD-10-CM

## 2019-03-05 LAB — CANCER ANTIGEN 27.29: CA 27.29: 13.5 U/mL (ref 0.0–38.6)

## 2019-03-07 ENCOUNTER — Other Ambulatory Visit: Payer: Self-pay

## 2019-03-08 NOTE — Progress Notes (Signed)
[place]  Telephone:(336) [phone] [phone]) [phone]  ID: [name] OB: 11-Jul-1956  MR#: [id number]  QBH#:[id number]  [name], MD as PCP - General (Family Medicine)  I connected with Autumn Ramos on 03/10/19 at  9:15 AM EDT by video enabled telemedicine visit and verified that I am speaking with the correct person using two identifiers.   I discussed the limitations, risks, security and privacy concerns of performing an evaluation and management service by telemedicine and the availability of in-person appointments. I also discussed with the patient that there may be a patient responsible charge related to this service. The patient expressed understanding and agreed to proceed.   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  INTERVAL HISTORY: Patient agreed to evaluation via video enabled telemedicine visit for her routine yearly follow-up.  She continues to have issues with discomfort and pain from her implants, but otherwise has felt well.  She has no neurologic complaints.  She denies any other pain.  She denies any recent fevers or illnesses. She has a good appetite and denies weight loss.  She denies any chest pain, shortness of breath, cough, or hemoptysis.  She denies any nausea, vomiting, constipation, or diarrhea. She has no urinary complaints.  Patient otherwise feels well and offers no further specific complaints today.  REVIEW OF SYSTEMS:   Review of Systems  Constitutional: Negative.  Negative for fever, malaise/fatigue and weight loss.  Respiratory: Negative.  Negative for cough and shortness of breath.   Cardiovascular: Negative.  Negative for chest pain and leg swelling.  Gastrointestinal:  Negative.  Negative for abdominal pain and constipation.  Genitourinary: Negative.  Negative for dysuria.  Musculoskeletal: Negative.  Negative for back pain.  Skin: Negative.  Negative for rash.  Neurological: Negative.  Negative for sensory change, focal weakness, weakness and headaches.  Psychiatric/Behavioral: Negative.  The patient is not nervous/anxious.    As per HPI. Otherwise, a complete review of systems is negative.   PAST MEDICAL HISTORY: Past Medical History:  Diagnosis Date   Breast cancer ([place])    COPD (chronic obstructive pulmonary disease) ([place])    Diabetes mellitus without complication ([place])    H/O breast implant    Hypertension    Last menstrual period (LMP) > 10 days ago 2006    PAST SURGICAL HISTORY: Past Surgical History:  Procedure Laterality Date   BREAST ENHANCEMENT SURGERY      FAMILY HISTORY Family History  Problem Relation Age of Onset   Coronary artery disease Father    Hypertension Father    Coronary artery disease Mother    Hypertension Mother    Diabetes Mother        ADVANCED DIRECTIVES:    HEALTH MAINTENANCE: Social History   Tobacco Use   Smoking status: Former Smoker    Packs/day: 0.25    Years: 25.00    Pack years: 6.25    Types: Cigarettes    Last attempt to quit: 06/14/2014    Years since quitting: 4.7   Smokeless tobacco: Never Used  Substance Use Topics   Alcohol use: No   Drug use: No    Allergies  Allergen Reactions   Acetylcysteine Shortness Of Breath   Codeine Itching and Nausea And Vomiting    Current  Outpatient Medications  Medication Sig Dispense Refill   albuterol (PROVENTIL HFA;VENTOLIN HFA) 108 (90 Base) MCG/ACT inhaler Inhale 1-2 puffs into the lungs every 4 (four) hours as needed for wheezing or shortness of breath.     Alogliptin-Metformin HCl (KAZANO) 12.5-500 MG TABS Take 1 tablet by mouth 2 (two) times daily.     aspirin EC 81 MG tablet Take 81 mg by mouth daily.      atorvastatin (LIPITOR) 80 MG tablet Take 80 mg by mouth at bedtime.   0   Cholecalciferol (VITAMIN D) 2000 units tablet Take 2,000 Units by mouth daily.     dexlansoprazole (DEXILANT) 60 MG capsule Take 60 mg by mouth daily.      Fluticasone-Salmeterol (ADVAIR) 500-50 MCG/DOSE AEPB Inhale 1 puff into the lungs 2 (two) times daily.     ibuprofen (ADVIL,MOTRIN) 800 MG tablet Take 800 mg by mouth every 6 (six) hours as needed for mild pain or moderate pain.      ipratropium-albuterol (DUONEB) 0.5-2.5 (3) MG/3ML SOLN Take 3 mLs by nebulization 3 (three) times daily as needed (for shortness of breath or wheezing).      isometheptene-acetaminophen-dichloralphenazone (MIDRIN) 65-100-325 MG capsule Take 1-2 capsules by mouth 2 (two) times daily as needed for migraine.      levothyroxine (SYNTHROID, LEVOTHROID) 100 MCG tablet Take 100 mcg by mouth daily before breakfast.     lisinopril (PRINIVIL,ZESTRIL) 10 MG tablet Take 1 tablet by mouth daily.     montelukast (SINGULAIR) 10 MG tablet Take 10 mg by mouth at bedtime.     ondansetron (ZOFRAN) 8 MG tablet Take 8 mg by mouth every 8 (eight) hours as needed for nausea or vomiting.      No current facility-administered medications for this visit.     OBJECTIVE: There were no vitals filed for this visit.   There is no height or weight on file to calculate BMI.    ECOG FS:0 - Asymptomatic  General: Well-developed, well-nourished, no acute distress. HEENT: Normocephalic. Neuro: Alert, answering all questions appropriately. Cranial nerves grossly intact. Skin: No rashes or petechiae noted. Psych: Normal affect.  LAB RESULTS:  Lab Results  Component Value Date   NA 136 03/09/2016   K 4.1 03/09/2016   CL 101 03/09/2016   CO2 27 03/09/2016   GLUCOSE 214 (H) 03/09/2016   BUN 17 03/09/2016   CREATININE 0.77 03/09/2016   CALCIUM 9.2 03/09/2016   PROT 7.0 02/27/2016   ALBUMIN 3.9 02/27/2016   AST 25 02/27/2016   ALT 28 02/27/2016   ALKPHOS  76 02/27/2016   BILITOT 0.3 02/27/2016   GFRNONAA >60 03/09/2016   GFRAA >60 03/09/2016    Lab Results  Component Value Date   WBC 15.7 (H) 03/09/2016   NEUTROABS 8.0 (H) 02/27/2016   HGB 11.9 (L) 03/09/2016   HCT 36.4 03/09/2016   MCV 82.7 03/09/2016   PLT 347 03/09/2016     STUDIES: No results found.  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  PLAN:    1. 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  Patient initial diagnosis was in November 2015.  She subsequently underwent bilateral mastectomy with implants on November 20, 2014 at [facility].  She did not require adjuvant XRT.  Given  the triple negative status of her cancer, she did not require treatment with an aromatase inhibitor. The benefit for her LCIS was questionable after mastectomy.  Patient's CA 27-29 continues to be within normal limits at 13.5.  No intervention is needed.  Return to clinic in 1 year with repeat laboratory work and further evaluation. 2.  Breast implants: Patient reports her initial plastic surgeon has left [facility] and she was recommended to follow-up with Dr. Audelia Hives.  I provided 20 minutes of face-to-face video visit time during this encounter, and > 50% was spent counseling as documented under my assessment & plan.   Patient expressed understanding and was in agreement with this plan. She also understands that She can call clinic at any time with any questions, concerns, or complaints.   Breast cancer   Staging form: Breast, AJCC 7th Edition     Clinical stage from 05/03/2015: Stage IA (T1b, N0, M0) - Signed by Lloyd Huger, MD on 05/03/2015   Lloyd Huger, MD   03/10/2019 11:40 AM

## 2019-03-10 ENCOUNTER — Ambulatory Visit: Payer: BLUE CROSS/BLUE SHIELD | Admitting: Oncology

## 2019-03-10 ENCOUNTER — Inpatient Hospital Stay (HOSPITAL_BASED_OUTPATIENT_CLINIC_OR_DEPARTMENT_OTHER): Payer: BLUE CROSS/BLUE SHIELD | Admitting: Oncology

## 2019-03-10 ENCOUNTER — Other Ambulatory Visit: Payer: Self-pay

## 2019-03-10 DIAGNOSIS — C50512 Malignant neoplasm of lower-outer quadrant of left female breast: Secondary | ICD-10-CM

## 2019-03-10 DIAGNOSIS — Z87891 Personal history of nicotine dependence: Secondary | ICD-10-CM

## 2019-03-10 DIAGNOSIS — Z79899 Other long term (current) drug therapy: Secondary | ICD-10-CM

## 2019-03-10 DIAGNOSIS — I10 Essential (primary) hypertension: Secondary | ICD-10-CM | POA: Diagnosis not present

## 2019-03-10 DIAGNOSIS — Z171 Estrogen receptor negative status [ER-]: Secondary | ICD-10-CM

## 2019-03-10 NOTE — Progress Notes (Signed)
Patient visit today for follow up regarding breast cancer. Patient reports ongoing issues with reconstruction, was followed by surgeon at [facility].

## 2019-03-11 ENCOUNTER — Ambulatory Visit: Payer: BLUE CROSS/BLUE SHIELD | Admitting: Oncology

## 2020-03-05 NOTE — Progress Notes (Signed)
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  INTERVAL HISTORY: Patient returns to clinic today for routine yearly evaluation.  She currently feels well and is asymptomatic.  She has no neurologic complaints.  She does not complain of pain today.  She denies any recent fevers or illnesses. She has a good appetite and denies weight loss.  She denies any chest pain, shortness of breath, cough, or hemoptysis.  She denies any nausea, vomiting, constipation, or diarrhea. She has no urinary complaints.  Patient offers no specific complaints today.  REVIEW OF SYSTEMS:   Review of Systems  Constitutional: Negative.  Negative for fever, malaise/fatigue and weight loss.  Respiratory: Negative.  Negative for cough and shortness of breath.   Cardiovascular: Negative.  Negative for chest pain and leg swelling.  Gastrointestinal: Negative.  Negative for abdominal pain and constipation.  Genitourinary: Negative.  Negative for dysuria.  Musculoskeletal: Negative.  Negative for back pain.  Skin: Negative.  Negative for rash.  Neurological: Negative.  Negative for sensory change, focal weakness, weakness and headaches.  Psychiatric/Behavioral: Negative.  The patient is not nervous/anxious.    As per HPI. Otherwise, a complete review of systems is negative.   PAST MEDICAL HISTORY: Past Medical History:  Diagnosis Date  . Breast cancer ([place])   . COPD (chronic obstructive pulmonary disease) ([place])   . Diabetes mellitus without complication ([place])   . H/O breast implant   . Hypertension   . Last menstrual period (LMP) > 10 days ago 2006    PAST SURGICAL HISTORY: Past Surgical History:  Procedure  Laterality Date  . BREAST ENHANCEMENT SURGERY      FAMILY HISTORY Family History  Problem Relation Age of Onset  . Coronary artery disease Father   . Hypertension Father   . Coronary artery disease Mother   . Hypertension Mother   . Diabetes Mother        ADVANCED DIRECTIVES:    HEALTH MAINTENANCE: Social History   Tobacco Use  . Smoking status: Former Smoker    Packs/day: 0.25    Years: 25.00    Pack years: 6.25    Types: Cigarettes    Quit date: 06/14/2014    Years since quitting: 5.7  . Smokeless tobacco: Never Used  Substance Use Topics  . Alcohol use: No  . Drug use: No    Allergies  Allergen Reactions  . Acetylcysteine Shortness Of Breath  . Codeine Itching and Nausea And Vomiting    Current Outpatient Medications  Medication Sig Dispense Refill  . albuterol (PROVENTIL HFA;VENTOLIN HFA) 108 (90 Base) MCG/ACT inhaler Inhale 1-2 puffs into the lungs every 4 (four) hours as needed for wheezing or shortness of breath.    . Alogliptin-Metformin HCl (KAZANO) 12.5-500 MG TABS Take 1 tablet by mouth 2 (two) times daily.    Marland Kitchen aspirin EC 81 MG tablet Take 81 mg by mouth daily.    Marland Kitchen atorvastatin (LIPITOR) 80 MG tablet Take 80 mg by mouth at bedtime.   0  . Cholecalciferol (VITAMIN D) 2000 units tablet Take 2,000 Units by mouth daily.    . cyclobenzaprine (FLEXERIL) 5 MG tablet Take 5 mg by mouth 2 (two) times daily as needed.    Marland Kitchen  dexlansoprazole (DEXILANT) 60 MG capsule Take 60 mg by mouth daily.     Marland Kitchen ezetimibe (ZETIA) 10 MG tablet Take 10 mg by mouth daily.    . Fluticasone-Salmeterol (ADVAIR) 500-50 MCG/DOSE AEPB Inhale 1 puff into the lungs 2 (two) times daily.    Marland Kitchen gabapentin (NEURONTIN) 300 MG capsule Take 2 capsules by mouth at bedtime.    Marland Kitchen ibuprofen (ADVIL,MOTRIN) 800 MG tablet Take 800 mg by mouth every 6 (six) hours as needed for mild pain or moderate pain.     Marland Kitchen ipratropium-albuterol (DUONEB) 0.5-2.5 (3) MG/3ML SOLN Take 3 mLs by nebulization 3 (three)  times daily as needed (for shortness of breath or wheezing).     . isometheptene-acetaminophen-dichloralphenazone (MIDRIN) 65-100-325 MG capsule Take 1-2 capsules by mouth 2 (two) times daily as needed for migraine.     Marland Kitchen levothyroxine (SYNTHROID, LEVOTHROID) 100 MCG tablet Take 100 mcg by mouth daily before breakfast.    . lisinopril (PRINIVIL,ZESTRIL) 10 MG tablet Take 1 tablet by mouth daily.    . metoprolol succinate (TOPROL-XL) 25 MG 24 hr tablet Take 1 tablet by mouth daily.    . montelukast (SINGULAIR) 10 MG tablet Take 10 mg by mouth at bedtime.    . ondansetron (ZOFRAN) 8 MG tablet Take 8 mg by mouth every 8 (eight) hours as needed for nausea or vomiting.     . TRELEGY ELLIPTA 100-62.5-25 MCG/INH AEPB Take 1 puff by mouth daily.     No current facility-administered medications for this visit.    OBJECTIVE: Vitals:   03/10/20 1104  BP: 118/68  Pulse: 65  Resp: 20  Temp: 97.6 F (36.4 C)  SpO2: 100%     Body mass index is 34.37 kg/m.    ECOG FS:0 - Asymptomatic  General: Well-developed, well-nourished, no acute distress. Eyes: Pink conjunctiva, anicteric sclera. HEENT: Normocephalic, moist mucous membranes. Breast: Bilateral mastectomy with reconstruction. Lungs: No audible wheezing or coughing. Heart: Regular rate and rhythm. Abdomen: Soft, nontender, no obvious distention. Musculoskeletal: No edema, cyanosis, or clubbing. Neuro: Alert, answering all questions appropriately. Cranial nerves grossly intact. Skin: No rashes or petechiae noted. Psych: Normal affect.  LAB RESULTS:  Lab Results  Component Value Date   NA 136 03/09/2016   K 4.1 03/09/2016   CL 101 03/09/2016   CO2 27 03/09/2016   GLUCOSE 214 (H) 03/09/2016   BUN 17 03/09/2016   CREATININE 0.77 03/09/2016   CALCIUM 9.2 03/09/2016   PROT 7.0 02/27/2016   ALBUMIN 3.9 02/27/2016   AST 25 02/27/2016   ALT 28 02/27/2016   ALKPHOS 76 02/27/2016   BILITOT 0.3 02/27/2016   GFRNONAA >60 03/09/2016    GFRAA >60 03/09/2016    Lab Results  Component Value Date   WBC 15.7 (H) 03/09/2016   NEUTROABS 8.0 (H) 02/27/2016   HGB 11.9 (L) 03/09/2016   HCT 36.4 03/09/2016   MCV 82.7 03/09/2016   PLT 347 03/09/2016     STUDIES: No results found.  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  PLAN:    1. 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  Patient initial diagnosis was in November 2015.  She subsequently underwent bilateral mastectomy with implants on November 20, 2014 at [facility].  She did not  require adjuvant XRT.  Given the triple negative status of her cancer, she did not require treatment with an aromatase inhibitor. The benefit of tamoxifen treatment for her LCIS was questionable after mastectomy.  Patient's CA 27-29 continues to be within normal limits at 18.5.  No intervention is needed at this time.  Return to clinic in 1 year with repeat laboratory work and video assisted telemedicine visit.   2.  Breast implants/reconstruction: Continue follow-up with plastic surgery as indicated.  I spent a total of 20 minutes reviewing chart data, face-to-face evaluation with the patient, counseling and coordination of care as detailed above.  Patient expressed understanding and was in agreement with this plan. She also understands that She can call clinic at any time with any questions, concerns, or complaints.   Breast cancer   Staging form: Breast, AJCC 7th Edition     Clinical stage from 05/03/2015: Stage IA (T1b, N0, M0) - Signed by Lloyd Huger, MD on 05/03/2015   Lloyd Huger, MD   03/10/2020 3:36 PM

## 2020-03-08 ENCOUNTER — Inpatient Hospital Stay: Payer: BC Managed Care – PPO | Attending: Oncology

## 2020-03-08 ENCOUNTER — Other Ambulatory Visit: Payer: Self-pay

## 2020-03-08 DIAGNOSIS — Z791 Long term (current) use of non-steroidal anti-inflammatories (NSAID): Secondary | ICD-10-CM | POA: Diagnosis not present

## 2020-03-08 DIAGNOSIS — Z7951 Long term (current) use of inhaled steroids: Secondary | ICD-10-CM | POA: Diagnosis not present

## 2020-03-08 DIAGNOSIS — Z79899 Other long term (current) drug therapy: Secondary | ICD-10-CM | POA: Insufficient documentation

## 2020-03-08 DIAGNOSIS — Z87891 Personal history of nicotine dependence: Secondary | ICD-10-CM | POA: Insufficient documentation

## 2020-03-08 DIAGNOSIS — Z9013 Acquired absence of bilateral breasts and nipples: Secondary | ICD-10-CM | POA: Insufficient documentation

## 2020-03-08 DIAGNOSIS — C50512 Malignant neoplasm of lower-outer quadrant of left female breast: Secondary | ICD-10-CM | POA: Insufficient documentation

## 2020-03-08 DIAGNOSIS — I10 Essential (primary) hypertension: Secondary | ICD-10-CM | POA: Insufficient documentation

## 2020-03-08 DIAGNOSIS — Z7984 Long term (current) use of oral hypoglycemic drugs: Secondary | ICD-10-CM | POA: Insufficient documentation

## 2020-03-08 DIAGNOSIS — J449 Chronic obstructive pulmonary disease, unspecified: Secondary | ICD-10-CM | POA: Insufficient documentation

## 2020-03-08 DIAGNOSIS — Z171 Estrogen receptor negative status [ER-]: Secondary | ICD-10-CM | POA: Diagnosis not present

## 2020-03-08 DIAGNOSIS — Z7982 Long term (current) use of aspirin: Secondary | ICD-10-CM | POA: Insufficient documentation

## 2020-03-08 DIAGNOSIS — E119 Type 2 diabetes mellitus without complications: Secondary | ICD-10-CM | POA: Diagnosis not present

## 2020-03-09 DIAGNOSIS — K5900 Constipation, unspecified: Secondary | ICD-10-CM | POA: Insufficient documentation

## 2020-03-09 DIAGNOSIS — E039 Hypothyroidism, unspecified: Secondary | ICD-10-CM | POA: Insufficient documentation

## 2020-03-09 DIAGNOSIS — J309 Allergic rhinitis, unspecified: Secondary | ICD-10-CM | POA: Insufficient documentation

## 2020-03-09 DIAGNOSIS — Z720 Tobacco use: Secondary | ICD-10-CM | POA: Insufficient documentation

## 2020-03-09 DIAGNOSIS — E669 Obesity, unspecified: Secondary | ICD-10-CM | POA: Insufficient documentation

## 2020-03-09 DIAGNOSIS — G43909 Migraine, unspecified, not intractable, without status migrainosus: Secondary | ICD-10-CM | POA: Insufficient documentation

## 2020-03-09 DIAGNOSIS — J449 Chronic obstructive pulmonary disease, unspecified: Secondary | ICD-10-CM | POA: Insufficient documentation

## 2020-03-09 DIAGNOSIS — E7800 Pure hypercholesterolemia, unspecified: Secondary | ICD-10-CM | POA: Insufficient documentation

## 2020-03-09 DIAGNOSIS — K219 Gastro-esophageal reflux disease without esophagitis: Secondary | ICD-10-CM | POA: Insufficient documentation

## 2020-03-09 LAB — CANCER ANTIGEN 27.29: CA 27.29: 18.5 U/mL (ref 0.0–38.6)

## 2020-03-10 ENCOUNTER — Other Ambulatory Visit: Payer: Self-pay

## 2020-03-10 ENCOUNTER — Inpatient Hospital Stay (HOSPITAL_BASED_OUTPATIENT_CLINIC_OR_DEPARTMENT_OTHER): Payer: BC Managed Care – PPO | Admitting: Oncology

## 2020-03-10 ENCOUNTER — Encounter: Payer: Self-pay | Admitting: Oncology

## 2020-03-10 VITALS — BP 118/68 | HR 65 | Temp 97.6°F | Resp 20 | Wt 176.0 lb

## 2020-03-10 DIAGNOSIS — C50512 Malignant neoplasm of lower-outer quadrant of left female breast: Secondary | ICD-10-CM

## 2020-03-10 NOTE — Progress Notes (Signed)
Patient here today for 1 year follow up. Patient denies any pain or concerns at this time.

## 2020-08-03 ENCOUNTER — Other Ambulatory Visit: Payer: Self-pay | Admitting: Physical Medicine & Rehabilitation

## 2020-08-03 DIAGNOSIS — M5412 Radiculopathy, cervical region: Secondary | ICD-10-CM

## 2020-08-10 ENCOUNTER — Ambulatory Visit
Admission: RE | Admit: 2020-08-10 | Discharge: 2020-08-10 | Disposition: A | Discharge status: Home / Self Care | Payer: BC Managed Care – PPO | Source: Ambulatory Visit | Attending: Physical Medicine & Rehabilitation | Admitting: Physical Medicine & Rehabilitation

## 2020-08-10 ENCOUNTER — Other Ambulatory Visit: Payer: Self-pay

## 2020-08-10 DIAGNOSIS — M5412 Radiculopathy, cervical region: Secondary | ICD-10-CM | POA: Diagnosis not present

## 2020-08-18 ENCOUNTER — Ambulatory Visit: Payer: BC Managed Care – PPO

## 2020-08-27 ENCOUNTER — Other Ambulatory Visit: Payer: Self-pay | Admitting: Physical Medicine & Rehabilitation

## 2020-08-27 DIAGNOSIS — M5412 Radiculopathy, cervical region: Secondary | ICD-10-CM

## 2020-09-02 ENCOUNTER — Ambulatory Visit
Admission: RE | Admit: 2020-09-02 | Discharge: 2020-09-02 | Disposition: A | Discharge status: Home / Self Care | Payer: BC Managed Care – PPO | Source: Ambulatory Visit | Attending: Physical Medicine & Rehabilitation | Admitting: Physical Medicine & Rehabilitation

## 2020-09-02 ENCOUNTER — Other Ambulatory Visit: Payer: Self-pay

## 2020-09-02 DIAGNOSIS — M5412 Radiculopathy, cervical region: Secondary | ICD-10-CM

## 2020-09-02 MED ORDER — TRIAMCINOLONE ACETONIDE 40 MG/ML IJ SUSP (RADIOLOGY)
60.0000 mg | Freq: Once | INTRAMUSCULAR | Status: AC
  Administered 2020-09-02: 60 mg via EPIDURAL

## 2020-09-02 MED ORDER — IOPAMIDOL (ISOVUE-M 300) INJECTION 61%
1.0000 mL | Freq: Once | INTRAMUSCULAR | Status: AC | PRN
  Administered 2020-09-02: 1 mL via EPIDURAL

## 2020-09-02 NOTE — Discharge Instructions (Signed)

## 2020-11-10 DIAGNOSIS — Z79899 Other long term (current) drug therapy: Secondary | ICD-10-CM | POA: Insufficient documentation

## 2021-03-08 ENCOUNTER — Inpatient Hospital Stay: Payer: BC Managed Care – PPO | Attending: Oncology

## 2021-03-08 DIAGNOSIS — Z87891 Personal history of nicotine dependence: Secondary | ICD-10-CM | POA: Insufficient documentation

## 2021-03-08 DIAGNOSIS — C50512 Malignant neoplasm of lower-outer quadrant of left female breast: Secondary | ICD-10-CM | POA: Insufficient documentation

## 2021-03-08 DIAGNOSIS — Z171 Estrogen receptor negative status [ER-]: Secondary | ICD-10-CM | POA: Diagnosis not present

## 2021-03-08 DIAGNOSIS — Z7982 Long term (current) use of aspirin: Secondary | ICD-10-CM | POA: Diagnosis not present

## 2021-03-08 DIAGNOSIS — Z79899 Other long term (current) drug therapy: Secondary | ICD-10-CM | POA: Insufficient documentation

## 2021-03-08 DIAGNOSIS — Z7951 Long term (current) use of inhaled steroids: Secondary | ICD-10-CM | POA: Diagnosis not present

## 2021-03-09 LAB — CANCER ANTIGEN 27.29: CA 27.29: 11.9 U/mL (ref 0.0–38.6)

## 2021-03-10 NOTE — Progress Notes (Signed)
[place]  [phone]) [phone] Fax:(336) [phone]  ID: Autumn Ramos OB: 07/21/1956  MR#: [id number]  JJK#:[id number]  [name], MD as PCP - General (Family Medicine)  I connected with Sonni Barse Wecker on 03/15/21 at  2:00 PM EDT by video enabled telemedicine visit and verified that I am speaking with the correct person using two identifiers.   I discussed the limitations, risks, security and privacy concerns of performing an evaluation and management service by telemedicine and the availability of in-person appointments. I also discussed with the patient that there may be a patient responsible charge related to this service. The patient expressed understanding and agreed to proceed.   Other persons participating in the visit and their role in the encounter: Patient, MD.  Patient's location: Home. Provider's location: Clinic.  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  INTERVAL HISTORY: Patient agreed to video assisted telemedicine visit for routine yearly evaluation.  She continues to feel well and remains asymptomatic.  She recently underwent a fourth surgery for replacement of her implants.  She has no neurologic complaints.  She does not complain of pain today.  She denies any recent fevers or illnesses. She has a good appetite and denies weight loss.  She denies any chest pain, shortness of breath, cough, or hemoptysis.  She denies any nausea, vomiting, constipation, or diarrhea. She has no urinary complaints.  Patient offers no further specific complaints today.  REVIEW OF SYSTEMS:   Review of Systems  Constitutional: Negative.  Negative for fever, malaise/fatigue and weight loss.  Respiratory: Negative.  Negative for cough and shortness of breath.   Cardiovascular: Negative.  Negative for chest pain and leg swelling.  Gastrointestinal: Negative.   Negative for abdominal pain and constipation.  Genitourinary: Negative.  Negative for dysuria.  Musculoskeletal: Negative.  Negative for back pain.  Skin: Negative.  Negative for rash.  Neurological: Negative.  Negative for sensory change, focal weakness, weakness and headaches.  Psychiatric/Behavioral: Negative.  The patient is not nervous/anxious.    As per HPI. Otherwise, a complete review of systems is negative.   PAST MEDICAL HISTORY: Past Medical History:  Diagnosis Date  . Breast cancer ([place])   . COPD (chronic obstructive pulmonary disease) ([place])   . Diabetes mellitus without complication ([place])   . H/O breast implant   . Hypertension   . Last menstrual period (LMP) > 10 days ago 2006    PAST SURGICAL HISTORY: Past Surgical History:  Procedure Laterality Date  . BREAST ENHANCEMENT SURGERY      FAMILY HISTORY Family History  Problem Relation Age of Onset  . Coronary artery disease Father   . Hypertension Father   . Coronary artery disease Mother   . Hypertension Mother   . Diabetes Mother        ADVANCED DIRECTIVES:    HEALTH MAINTENANCE: Social History   Tobacco Use  . Smoking status: Former Smoker    Packs/day: 0.25    Years: 25.00    Pack years: 6.25    Types: Cigarettes    Quit date: 06/14/2014    Years since quitting: 6.7  . Smokeless tobacco: Never Used  Vaping Use  . Vaping Use: Never used  Substance Use Topics  . Alcohol use: No  . Drug use: No    Allergies  Allergen Reactions  . Acetylcysteine Shortness Of Breath  . Tramadol Itching  . Codeine  Itching and Nausea And Vomiting    Current Outpatient Medications  Medication Sig Dispense Refill  . albuterol (PROVENTIL HFA;VENTOLIN HFA) 108 (90 Base) MCG/ACT inhaler Inhale 1-2 puffs into the lungs every 4 (four) hours as needed for wheezing or shortness of breath.    Marland Kitchen aspirin EC 81 MG tablet Take 81 mg by mouth daily.    Marland Kitchen atorvastatin (LIPITOR) 80 MG tablet Take 80 mg by mouth at  bedtime.   0  . Cholecalciferol (VITAMIN D) 2000 units tablet Take 2,000 Units by mouth daily.    . cyclobenzaprine (FLEXERIL) 5 MG tablet Take 5 mg by mouth 2 (two) times daily as needed.    Marland Kitchen dexlansoprazole (DEXILANT) 60 MG capsule Take 60 mg by mouth daily.     Marland Kitchen ezetimibe (ZETIA) 10 MG tablet Take 10 mg by mouth daily.    . Fluticasone-Salmeterol (ADVAIR) 500-50 MCG/DOSE AEPB Inhale 1 puff into the lungs 2 (two) times daily.    Marland Kitchen gabapentin (NEURONTIN) 300 MG capsule Take 2 capsules by mouth at bedtime.    Marland Kitchen ibuprofen (ADVIL,MOTRIN) 800 MG tablet Take 800 mg by mouth every 6 (six) hours as needed for mild pain or moderate pain.     Marland Kitchen ipratropium-albuterol (DUONEB) 0.5-2.5 (3) MG/3ML SOLN Take 3 mLs by nebulization 3 (three) times daily as needed (for shortness of breath or wheezing).     . isometheptene-acetaminophen-dichloralphenazone (MIDRIN) 65-100-325 MG capsule Take 1-2 capsules by mouth 2 (two) times daily as needed for migraine.     Marland Kitchen levothyroxine (SYNTHROID, LEVOTHROID) 100 MCG tablet Take 100 mcg by mouth daily before breakfast.    . lisinopril (PRINIVIL,ZESTRIL) 10 MG tablet Take 1 tablet by mouth daily.    . montelukast (SINGULAIR) 10 MG tablet Take 10 mg by mouth at bedtime.    . ondansetron (ZOFRAN) 8 MG tablet Take 8 mg by mouth every 8 (eight) hours as needed for nausea or vomiting.     . TRELEGY ELLIPTA 100-62.5-25 MCG/INH AEPB Take 1 puff by mouth daily.    . metoprolol succinate (TOPROL-XL) 25 MG 24 hr tablet Take 1 tablet by mouth daily.     No current facility-administered medications for this visit.    OBJECTIVE: Vitals:   03/15/21 1325  BP: 123/78     Body mass index is 38.28 kg/m.    ECOG FS:0 - Asymptomatic  General: Well-developed, well-nourished, no acute distress. HEENT: Normocephalic. Neuro: Alert, answering all questions appropriately. Cranial nerves grossly intact. Psych: Normal affect.  LAB RESULTS:  Lab Results  Component Value Date   NA 136  03/09/2016   K 4.1 03/09/2016   CL 101 03/09/2016   CO2 27 03/09/2016   GLUCOSE 214 (H) 03/09/2016   BUN 17 03/09/2016   CREATININE 0.77 03/09/2016   CALCIUM 9.2 03/09/2016   PROT 7.0 02/27/2016   ALBUMIN 3.9 02/27/2016   AST 25 02/27/2016   ALT 28 02/27/2016   ALKPHOS 76 02/27/2016   BILITOT 0.3 02/27/2016   GFRNONAA >60 03/09/2016   GFRAA >60 03/09/2016    Lab Results  Component Value Date   WBC 15.7 (H) 03/09/2016   NEUTROABS 8.0 (H) 02/27/2016   HGB 11.9 (L) 03/09/2016   HCT 36.4 03/09/2016   MCV 82.7 03/09/2016   PLT 347 03/09/2016     STUDIES: No results found.  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  PLAN:    1. Stage IA triple negative  adenocarcinoma of the lower outer quadrant of the left breast, LCIS of the right breast: Patient is triple negative, but her tumor size was less than 0.5 cm therefore no advjuvant chemotherapy was recommended.  Patient initial diagnosis was in November 2015.  She subsequently underwent bilateral mastectomy with implants on November 20, 2014 at [facility].  She did not require adjuvant XRT.  Given the triple negative status of her cancer, she did not require treatment with an aromatase inhibitor. The benefit of tamoxifen treatment for her LCIS was questionable after mastectomy.  Patient's CA 27-29 continues to be low within normal limits at 11.9.  No intervention is needed at this time.  Patient have video assisted visit in 1 year for routine evaluation.  Continue to follow patient for a total of 10 years after her surgery.   2.  Breast implants/reconstruction: Patient reports she underwent a fourth implant surgery recently.  I provided 20 minutes of face-to-face video visit time during this encounter which included chart review, counseling, and coordination of care as documented above.  Patient expressed understanding and was in agreement with this plan. She  also understands that She can call clinic at any time with any questions, concerns, or complaints.   Breast cancer   Staging form: Breast, AJCC 7th Edition     Clinical stage from 05/03/2015: Stage IA (T1b, N0, M0) - Signed by Lloyd Huger, MD on 05/03/2015   Lloyd Huger, MD   03/15/2021 4:45 PM

## 2021-03-15 ENCOUNTER — Inpatient Hospital Stay (HOSPITAL_BASED_OUTPATIENT_CLINIC_OR_DEPARTMENT_OTHER): Payer: BC Managed Care – PPO | Admitting: Oncology

## 2021-03-15 VITALS — BP 123/78 | Wt 196.0 lb

## 2021-03-15 DIAGNOSIS — C50512 Malignant neoplasm of lower-outer quadrant of left female breast: Secondary | ICD-10-CM | POA: Diagnosis not present

## 2021-04-02 ENCOUNTER — Other Ambulatory Visit: Payer: Self-pay

## 2021-04-02 ENCOUNTER — Encounter: Payer: Self-pay | Admitting: Intensive Care

## 2021-04-02 ENCOUNTER — Emergency Department: Payer: BC Managed Care – PPO

## 2021-04-02 ENCOUNTER — Emergency Department
Admission: EM | Admit: 2021-04-02 | Discharge: 2021-04-02 | Disposition: A | Discharge status: Home / Self Care | Payer: BC Managed Care – PPO | Attending: Emergency Medicine | Admitting: Emergency Medicine

## 2021-04-02 DIAGNOSIS — J449 Chronic obstructive pulmonary disease, unspecified: Secondary | ICD-10-CM | POA: Diagnosis not present

## 2021-04-02 DIAGNOSIS — Z87891 Personal history of nicotine dependence: Secondary | ICD-10-CM | POA: Insufficient documentation

## 2021-04-02 DIAGNOSIS — E039 Hypothyroidism, unspecified: Secondary | ICD-10-CM | POA: Diagnosis not present

## 2021-04-02 DIAGNOSIS — T50Z95A Adverse effect of other vaccines and biological substances, initial encounter: Secondary | ICD-10-CM | POA: Diagnosis not present

## 2021-04-02 DIAGNOSIS — R072 Precordial pain: Secondary | ICD-10-CM | POA: Insufficient documentation

## 2021-04-02 DIAGNOSIS — R0789 Other chest pain: Secondary | ICD-10-CM

## 2021-04-02 DIAGNOSIS — I10 Essential (primary) hypertension: Secondary | ICD-10-CM | POA: Insufficient documentation

## 2021-04-02 DIAGNOSIS — Z4802 Encounter for removal of sutures: Secondary | ICD-10-CM | POA: Diagnosis not present

## 2021-04-02 DIAGNOSIS — Z79899 Other long term (current) drug therapy: Secondary | ICD-10-CM | POA: Insufficient documentation

## 2021-04-02 DIAGNOSIS — Z7982 Long term (current) use of aspirin: Secondary | ICD-10-CM | POA: Diagnosis not present

## 2021-04-02 DIAGNOSIS — E119 Type 2 diabetes mellitus without complications: Secondary | ICD-10-CM | POA: Insufficient documentation

## 2021-04-02 DIAGNOSIS — Z7951 Long term (current) use of inhaled steroids: Secondary | ICD-10-CM | POA: Diagnosis not present

## 2021-04-02 LAB — BASIC METABOLIC PANEL
Anion gap: 9 (ref 5–15)
BUN: 11 mg/dL (ref 8–23)
CO2: 28 mmol/L (ref 22–32)
Calcium: 9.2 mg/dL (ref 8.9–10.3)
Chloride: 102 mmol/L (ref 98–111)
Creatinine, Ser: 0.63 mg/dL (ref 0.44–1.00)
GFR, Estimated: >60 mL/min (ref >60)
Glucose, Bld: 178 mg/dL — ABNORMAL HIGH (ref 70–99)
Potassium: 3.9 mmol/L (ref 3.5–5.1)
Sodium: 139 mmol/L (ref 135–145)

## 2021-04-02 LAB — CBC
HCT: 41.6 % (ref 36.0–46.0)
Hemoglobin: 13.6 g/dL (ref 12.0–15.0)
MCH: 29.1 pg (ref 26.0–34.0)
MCHC: 32.7 g/dL (ref 30.0–36.0)
MCV: 89.1 fL (ref 80.0–100.0)
Platelets: 235 10*3/uL (ref 150–400)
RBC: 4.67 MIL/uL (ref 3.87–5.11)
RDW: 13.8 % (ref 11.5–15.5)
WBC: 6.9 10*3/uL (ref 4.0–10.5)
nRBC: 0 % (ref 0.0–0.2)

## 2021-04-02 LAB — TROPONIN I (HIGH SENSITIVITY)
Troponin I (High Sensitivity): 5 ng/L (ref <18)
Troponin I (High Sensitivity): 6 ng/L (ref <18)

## 2021-04-02 MED ORDER — IOHEXOL 350 MG/ML SOLN
100.0000 mL | Freq: Once | INTRAVENOUS | Status: AC | PRN
  Administered 2021-04-02: 100 mL via INTRAVENOUS

## 2021-04-02 MED ORDER — ALUM & MAG HYDROXIDE-SIMETH 200-200-20 MG/5ML PO SUSP
15.0000 mL | Freq: Once | ORAL | Status: AC
  Administered 2021-04-02: 15 mL via ORAL
  Filled 2021-04-02: qty 30

## 2021-04-02 MED ORDER — FAMOTIDINE 20 MG PO TABS
20.0000 mg | ORAL_TABLET | Freq: Once | ORAL | Status: AC
  Administered 2021-04-02: 20 mg via ORAL
  Filled 2021-04-02: qty 1

## 2021-04-02 MED ORDER — MORPHINE SULFATE (PF) 4 MG/ML IV SOLN
4.0000 mg | Freq: Once | INTRAVENOUS | Status: AC
Start: 2021-04-02 — End: 2021-04-02
  Administered 2021-04-02: 4 mg via INTRAVENOUS
  Filled 2021-04-02: qty 1

## 2021-04-02 NOTE — ED Provider Notes (Signed)
[facility] Emergency Department Provider Note ____________________________________________   Event Date/Time   First MD Initiated Contact with Patient 04/02/21 1314     (approximate)  I have reviewed the triage vital signs and the nursing notes.   HISTORY  Chief Complaint Chest Pain    HPI Autumn Ramos is a 65 y.o. female with PMH as noted below including COPD, diabetes, hypertension, and breast cancer presents with chest pain, acute onset several hours ago this morning, waxing and waning in intensity, substernal but radiating to her back and neck.  The patient states that she has had similar pain previously with GERD and took Tums without relief.  She had a second booster dose of the Moderna COVID-vaccine yesterday.  During the night she had fever, body aches, and weakness but the chest pain developed in the morning.  Past Medical History:  Diagnosis Date  . Breast cancer ([place])   . COPD (chronic obstructive pulmonary disease) ([place])   . Diabetes mellitus without complication ([place])   . H/O breast implant   . Hypertension   . Last menstrual period (LMP) > 10 days ago 2006    Patient Active Problem List   Diagnosis Date Noted  . High risk medication use 11/10/2020  . Allergic rhinitis 03/09/2020  . Chronic obstructive lung disease ([place]) 03/09/2020  . Constipation 03/09/2020  . Hypercholesterolemia 03/09/2020  . Gastroesophageal reflux disease 03/09/2020  . Hypothyroidism 03/09/2020  . Migraine 03/09/2020  . Obesity 03/09/2020  . Tobacco user 03/09/2020  . Acquired absence of both breasts 01/16/2018  . Acute bronchitis due to infection 03/08/2016  . Neoplasm of left breast, 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) 03/06/2016  . Primary cancer of lower-outer quadrant of left breast ([place]) 05/03/2015  . Infiltrating ductal carcinoma of right breast, stage 1 ([place]) 10/26/2014  . Atypical chest pain 09/23/2014  .  Hyperlipidemia 09/23/2014  . Hypertension 09/23/2014  . Major depressive disorder 06/12/2011  . Poisoning by drug or medicinal substance 06/11/2011    Past Surgical History:  Procedure Laterality Date  . BREAST ENHANCEMENT SURGERY      Prior to Admission medications   Medication Sig Start Date End Date Taking? Authorizing Provider  albuterol (PROVENTIL HFA;VENTOLIN HFA) 108 (90 Base) MCG/ACT inhaler Inhale 1-2 puffs into the lungs every 4 (four) hours as needed for wheezing or shortness of breath.    [provider]  aspirin EC 81 MG tablet Take 81 mg by mouth daily.    [provider]  atorvastatin (LIPITOR) 80 MG tablet Take 80 mg by mouth at bedtime.     [provider]  Cholecalciferol (VITAMIN D) 2000 units tablet Take 2,000 Units by mouth daily.    [provider]  cyclobenzaprine (FLEXERIL) 5 MG tablet Take 5 mg by mouth 2 (two) times daily as needed. 12/19/19   [provider]  dexlansoprazole (DEXILANT) 60 MG capsule Take 60 mg by mouth daily.     [provider]  ezetimibe (ZETIA) 10 MG tablet Take 10 mg by mouth daily. 12/17/19   [provider]  Fluticasone-Salmeterol (ADVAIR) 500-50 MCG/DOSE AEPB Inhale 1 puff into the lungs 2 (two) times daily.    [provider]  gabapentin (NEURONTIN) 300 MG capsule Take 2 capsules by mouth at bedtime. 02/28/20   [provider]  ibuprofen (ADVIL,MOTRIN) 800 MG tablet Take 800 mg by mouth every 6 (six) hours as needed for mild pain or moderate pain.  [provider]  ipratropium-albuterol (DUONEB) 0.5-2.5 (3) MG/3ML SOLN Take 3 mLs by nebulization 3 (three) times daily as needed (for shortness of breath or wheezing).     [provider]  isometheptene-acetaminophen-dichloralphenazone (MIDRIN) [phone] MG capsule Take 1-2 capsules by mouth 2 (two) times daily as needed for migraine.     [provider]  levothyroxine (SYNTHROID,  LEVOTHROID) 100 MCG tablet Take 100 mcg by mouth daily before breakfast.    [provider]  lisinopril (PRINIVIL,ZESTRIL) 10 MG tablet Take 1 tablet by mouth daily.    [provider]  metoprolol succinate (TOPROL-XL) 25 MG 24 hr tablet Take 1 tablet by mouth daily. 12/18/19 06/16/20  [provider]  montelukast (SINGULAIR) 10 MG tablet Take 10 mg by mouth at bedtime.    [provider]  ondansetron (ZOFRAN) 8 MG tablet Take 8 mg by mouth every 8 (eight) hours as needed for nausea or vomiting.     [provider]  TRELEGY ELLIPTA 100-62.5-25 MCG/INH AEPB Take 1 puff by mouth daily. 02/13/20   [provider]    Allergies Acetylcysteine, Tramadol, and Codeine  Family History  Problem Relation Age of Onset  . Coronary artery disease Father   . Hypertension Father   . Coronary artery disease Mother   . Hypertension Mother   . Diabetes Mother     Social History Social History   Tobacco Use  . Smoking status: Former Smoker    Packs/day: 0.25    Years: 25.00    Pack years: 6.25    Types: Cigarettes    Quit date: 06/14/2014    Years since quitting: 6.8  . Smokeless tobacco: Never Used  Vaping Use  . Vaping Use: Never used  Substance Use Topics  . Alcohol use: Yes    Comment: rare  . Drug use: Yes    Comment: prescribed hydrocodone    Review of Systems  Constitutional: Positive fever and chills Eyes: No redness. ENT: No sore throat. Cardiovascular: Positive for chest pain. Respiratory: Denies shortness of breath. Gastrointestinal: Positive for resolved nausea and vomiting. Genitourinary: Negative for dysuria.  Musculoskeletal: Positive for body aches. Skin: Negative for rash. Neurological: Positive for headache.   ____________________________________________   PHYSICAL EXAM:  VITAL SIGNS: ED Triage Vitals  Enc Vitals Group     BP 04/02/21 1246 (!) 151/78     Pulse Rate 04/02/21 1246 96     Resp 04/02/21 1246  20     Temp 04/02/21 1246 99.8 F (37.7 C)     Temp Source 04/02/21 1246 Oral     SpO2 04/02/21 1246 93 %     Weight 04/02/21 1248 195 lb (88.5 kg)     Height 04/02/21 1248 5' (1.524 m)     Head Circumference --      Peak Flow --      Pain Score --      Pain Loc --      Pain Edu? --      Excl. in [place]? --     Constitutional: Alert and oriented.  Relatively well appearing and in no acute distress. Eyes: Conjunctivae are normal.  Head: Atraumatic. Nose: No congestion/rhinnorhea. Mouth/Throat: Mucous membranes are moist.   Neck: Normal range of motion.  Cardiovascular: Normal rate, regular rhythm. Grossly normal heart sounds.  Good peripheral circulation. Respiratory: Normal respiratory effort.  No retractions. Lungs CTAB. Gastrointestinal: No distention.  Musculoskeletal: No lower extremity edema.  Extremities warm and well perfused.  No calf or  popliteal swelling or tenderness. Neurologic:  Normal speech and language. No gross focal neurologic deficits are appreciated.  Skin:  Skin is warm and dry. No rash noted. Psychiatric: Mood and affect are normal. Speech and behavior are normal.  ____________________________________________   LABS (all labs ordered are listed, but only abnormal results are displayed)  Labs Reviewed  BASIC METABOLIC PANEL - Abnormal; Notable for the following components:      Result Value   Glucose, Bld 178 (*)    All other components within normal limits  CBC  TROPONIN I (HIGH SENSITIVITY)  TROPONIN I (HIGH SENSITIVITY)   ____________________________________________  EKG  ED ECG REPORT I, Arta Silence, the attending physician, personally viewed and interpreted this ECG.  Date: 04/02/2021 EKG Time: 1249 Rate: 93 Rhythm: normal sinus rhythm QRS Axis: Left axis Intervals: normal ST/T Wave abnormalities: normal Narrative Interpretation: no evidence of acute ischemia  ____________________________________________  RADIOLOGY  Chest  x-ray interpreted by me shows no focal consolidation or edema.  There is increased prominence of the pulmonary vessels when compared to prior x-ray from 2017.  ____________________________________________   PROCEDURES  Procedure(s) performed: No  Procedures  Critical Care performed: No ____________________________________________   INITIAL IMPRESSION / ASSESSMENT AND PLAN / ED COURSE  Pertinent labs & imaging results that were available during my care of the patient were reviewed by me and considered in my medical decision making (see chart for details).  65 year old female with PMH as noted above including COPD, diabetes, hypertension, breast cancer, and GERD presents with somewhat atypical chest pain since this morning after getting a Moderna COVID booster yesterday and subsequently having body aches, fever, and vomiting during the night.  On exam, the patient is overall well-appearing.  Her vital signs are normal except for borderline elevated temperature and mild hypertension.  The physical exam is otherwise unremarkable.  EKG is nonischemic.  Differential includes GERD, musculoskeletal pain, ACS, myocarditis related to the vaccine, or less likely vascular etiology.  I do not suspect aortic dissection or PE given the waxing and waning nature of the pain (which was almost entirely resolved when I examined the patient), and lack of tachycardia or significant hypertension, or specific risk factors.  We will obtain basic labs, cardiac enzymes x2, give symptomatic treatment for GERD, and reassess.  ----------------------------------------- 3:49 PM on 04/02/2021 -----------------------------------------  Troponins x2 are negative.  The patient states that the pain improved with Pepcid and Maalox, but she is still having moderate pain that is rating to the back.  On my reassessment she was also slightly tachycardic.  Therefore I have ordered a CT angio to rule out dissection or PE.  I  signed the patient out to the oncoming ED physician Dr. Joni Fears.  I anticipate discharge home if the CT is negative and the patient is feeling well.  ____________________________________________   FINAL CLINICAL IMPRESSION(S) / ED DIAGNOSES  Final diagnoses:  Atypical chest pain  Vaccine reaction, initial encounter      NEW MEDICATIONS STARTED DURING THIS VISIT:  New Prescriptions   No medications on file     Note:  This document was prepared using Dragon voice recognition software and may include unintentional dictation errors.    Arta Silence, MD 04/02/21 1550

## 2021-04-02 NOTE — Discharge Instructions (Addendum)
We suspect that your chest pain is likely related to acid reflux, and your symptoms over the last day are related to vaccination side effects.  Continue take your normal reflux medications.  Follow-up with your primary doctor.  Your lab tests and CT scan today were all reassuring.  Return to the ER for new, worsening, or persistent severe chest pain, difficulty breathing, weakness or lightheadedness, or any other new or worsening symptoms that concern you.

## 2021-04-02 NOTE — ED Notes (Signed)
Pt given water with MD approval

## 2021-04-02 NOTE — ED Triage Notes (Signed)
Patient c/o chest pain, chills, achy, headache, fever and vomiting. Reports she got 4th dose of Moderna yesterday.

## 2021-04-02 NOTE — ED Notes (Signed)
Patient transported to X-ray 

## 2021-04-02 NOTE — ED Notes (Signed)
Patient transported to CT 

## 2021-07-18 ENCOUNTER — Encounter: Payer: Self-pay | Admitting: *Deleted

## 2022-03-14 ENCOUNTER — Inpatient Hospital Stay: Payer: Medicare Other | Attending: Oncology

## 2022-03-14 DIAGNOSIS — Z853 Personal history of malignant neoplasm of breast: Secondary | ICD-10-CM | POA: Diagnosis present

## 2022-03-14 DIAGNOSIS — C50512 Malignant neoplasm of lower-outer quadrant of left female breast: Secondary | ICD-10-CM

## 2022-03-15 LAB — CANCER ANTIGEN 27.29: CA 27.29: 16.2 U/mL (ref 0.0–38.6)

## 2022-03-19 NOTE — Progress Notes (Unsigned)
[place]  Telephone:([phone]) [phone] Fax:(336) [phone]  ID: Autumn Ramos OB: 1955-11-05  MR#: [id number]  UKG#:[id number]  [name], MD as PCP - General (Family Medicine)  I connected with Autumn Ramos on 03/22/22 at  2:45 PM EDT by video enabled telemedicine visit and verified that I am speaking with the correct person using two identifiers.   I discussed the limitations, risks, security and privacy concerns of performing an evaluation and management service by telemedicine and the availability of in-person appointments. I also discussed with the patient that there may be a patient responsible charge related to this service. The patient expressed understanding and agreed to proceed.   Other persons participating in the visit and their role in the encounter: Patient, MD.  Patient's location: Home. Provider's location: Clinic.  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    INTERVAL HISTORY: Patient agreed to video assisted telemedicine visit for her routine yearly evaluation.  She continues to feel well and remains asymptomatic.  She has no neurologic complaints.  She does not complain of pain today.  She denies any recent fevers or illnesses. She has a good appetite and denies weight loss.  She denies any chest pain, shortness of breath, cough, or hemoptysis.  She denies any nausea, vomiting, constipation, or diarrhea. She has no urinary complaints.  Patient offers no specific complaints today.  REVIEW OF SYSTEMS:   Review of Systems  Constitutional: Negative.  Negative for fever, malaise/fatigue and weight loss.  Respiratory: Negative.  Negative for cough and shortness of breath.   Cardiovascular: Negative.  Negative for chest pain and leg swelling.  Gastrointestinal: Negative.  Negative for abdominal pain and constipation.  Genitourinary: Negative.   Negative for dysuria.  Musculoskeletal: Negative.  Negative for back pain.  Skin: Negative.  Negative for rash.  Neurological: Negative.  Negative for sensory change, focal weakness, weakness and headaches.  Psychiatric/Behavioral: Negative.  The patient is not nervous/anxious.    As per HPI. Otherwise, a complete review of systems is negative.   PAST MEDICAL HISTORY: Past Medical History:  Diagnosis Date   Breast cancer ([place])    COPD (chronic obstructive pulmonary disease) ([place])    Diabetes mellitus without complication ([place])    H/O breast implant    Hypertension    Last menstrual period (LMP) > 10 days ago 2006    PAST SURGICAL HISTORY: Past Surgical History:  Procedure Laterality Date   BREAST ENHANCEMENT SURGERY      FAMILY HISTORY Family History  Problem Relation Age of Onset   Coronary artery disease Father    Hypertension Father    Coronary artery disease Mother    Hypertension Mother    Diabetes Mother        ADVANCED DIRECTIVES:    HEALTH MAINTENANCE: Social History   Tobacco Use   Smoking status: Former    Packs/day: 0.25    Years: 25.00    Pack years: 6.25    Types: Cigarettes    Quit date: 06/14/2014    Years since quitting: 7.7   Smokeless tobacco: Never  Vaping Use   Vaping Use: Never used  Substance Use Topics   Alcohol use: Yes    Comment: rare   Drug use: Yes    Comment: prescribed hydrocodone    Allergies  Allergen Reactions   Acetylcysteine Shortness Of Breath   Tramadol Itching   Codeine Itching  and Nausea And Vomiting    Current Outpatient Medications  Medication Sig Dispense Refill   albuterol (PROVENTIL HFA;VENTOLIN HFA) 108 (90 Base) MCG/ACT inhaler Inhale 1-2 puffs into the lungs every 4 (four) hours as needed for wheezing or shortness of breath.     aspirin EC 81 MG tablet Take 81 mg by mouth daily.     Cholecalciferol (VITAMIN D) 2000 units tablet Take 2,000 Units by mouth daily.     cyclobenzaprine (FLEXERIL) 5 MG  tablet Take 5 mg by mouth 2 (two) times daily as needed.     dexlansoprazole (DEXILANT) 60 MG capsule Take 60 mg by mouth daily.      ezetimibe (ZETIA) 10 MG tablet Take 10 mg by mouth daily.     Fluticasone-Salmeterol (ADVAIR) 500-50 MCG/DOSE AEPB Inhale 1 puff into the lungs 2 (two) times daily.     gabapentin (NEURONTIN) 300 MG capsule Take 2 capsules by mouth at bedtime.     HYDROcodone-acetaminophen (NORCO/VICODIN) 5-325 MG tablet Take by mouth.     ibuprofen (ADVIL,MOTRIN) 800 MG tablet Take 800 mg by mouth every 6 (six) hours as needed for mild pain or moderate pain.      ipratropium-albuterol (DUONEB) 0.5-2.5 (3) MG/3ML SOLN Take 3 mLs by nebulization 3 (three) times daily as needed (for shortness of breath or wheezing).      isometheptene-acetaminophen-dichloralphenazone (MIDRIN) 65-100-325 MG capsule Take 1-2 capsules by mouth 2 (two) times daily as needed for migraine.      levothyroxine (SYNTHROID, LEVOTHROID) 100 MCG tablet Take 100 mcg by mouth daily before breakfast.     lisinopril (PRINIVIL,ZESTRIL) 10 MG tablet Take 1 tablet by mouth daily.     metoprolol succinate (TOPROL-XL) 25 MG 24 hr tablet Take 1 tablet by mouth daily.     montelukast (SINGULAIR) 10 MG tablet Take 10 mg by mouth at bedtime.     ondansetron (ZOFRAN) 8 MG tablet Take 8 mg by mouth every 8 (eight) hours as needed for nausea or vomiting.      pantoprazole (PROTONIX) 40 MG tablet Take 40 mg by mouth daily.     TRELEGY ELLIPTA 100-62.5-25 MCG/INH AEPB Take 1 puff by mouth daily.     atorvastatin (LIPITOR) 80 MG tablet Take 80 mg by mouth at bedtime.  (Patient not taking: Reported on 03/21/2022)  0   No current facility-administered medications for this visit.    OBJECTIVE: There were no vitals filed for this visit.    There is no height or weight on file to calculate BMI.    ECOG FS:0 - Asymptomatic  General: Well-developed, well-nourished, no acute distress. HEENT: Normocephalic. Neuro: Alert, answering  all questions appropriately. Cranial nerves grossly intact. Psych: Normal affect.  LAB RESULTS:  Lab Results  Component Value Date   NA 139 04/02/2021   K 3.9 04/02/2021   CL 102 04/02/2021   CO2 28 04/02/2021   GLUCOSE 178 (H) 04/02/2021   BUN 11 04/02/2021   CREATININE 0.63 04/02/2021   CALCIUM 9.2 04/02/2021   PROT 7.0 02/27/2016   ALBUMIN 3.9 02/27/2016   AST 25 02/27/2016   ALT 28 02/27/2016   ALKPHOS 76 02/27/2016   BILITOT 0.3 02/27/2016   GFRNONAA >60 04/02/2021   GFRAA >60 03/09/2016    Lab Results  Component Value Date   WBC 6.9 04/02/2021   NEUTROABS 8.0 (H) 02/27/2016   HGB 13.6 04/02/2021   HCT 41.6 04/02/2021   MCV 89.1 04/02/2021   PLT 235 04/02/2021     STUDIES:  No results found.  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  PLAN:    1. 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  Patient initial diagnosis was in November 2015.  She subsequently underwent bilateral mastectomy with implants on November 20, 2014 at [facility].  She did not require adjuvant XRT.  Given the triple negative status of her cancer, she did not require treatment with an aromatase inhibitor. The benefit of tamoxifen treatment for her LCIS was questionable after mastectomy.  Patient's CA 27-29 continues to be within normal limits at 16.2.  No intervention is needed at this time.  Patient will have video-assisted telemedicine visit in 1 year for routine evaluation.  Will continue to follow patient for a total of 10 years after her surgery.   2.  Breast implants/reconstruction: Patient has now completed all of her surgeries.  I provided 20 minutes of face-to-face video visit time during this encounter which included chart  review, counseling, and coordination of care as documented above.   Patient expressed understanding and was in agreement with this plan. She also understands that She can call clinic at any time with any questions, concerns, or complaints.   Breast cancer   Staging form: Breast, AJCC 7th Edition     Clinical stage from 05/03/2015: Stage IA (T1b, N0, M0) - Signed by Lloyd Huger, MD on 05/03/2015   Lloyd Huger, MD   03/22/2022 7:02 AM

## 2022-03-21 ENCOUNTER — Inpatient Hospital Stay (HOSPITAL_BASED_OUTPATIENT_CLINIC_OR_DEPARTMENT_OTHER): Payer: Medicare Other | Admitting: Oncology

## 2022-03-21 ENCOUNTER — Encounter: Payer: Self-pay | Admitting: Oncology

## 2022-03-21 DIAGNOSIS — E119 Type 2 diabetes mellitus without complications: Secondary | ICD-10-CM

## 2022-03-21 DIAGNOSIS — I10 Essential (primary) hypertension: Secondary | ICD-10-CM | POA: Diagnosis not present

## 2022-03-21 DIAGNOSIS — Z171 Estrogen receptor negative status [ER-]: Secondary | ICD-10-CM

## 2022-03-21 DIAGNOSIS — C50512 Malignant neoplasm of lower-outer quadrant of left female breast: Secondary | ICD-10-CM

## 2022-03-21 DIAGNOSIS — Z87891 Personal history of nicotine dependence: Secondary | ICD-10-CM

## 2022-04-17 ENCOUNTER — Encounter: Payer: Medicare Other | Attending: Pulmonary Disease

## 2022-04-17 ENCOUNTER — Other Ambulatory Visit: Payer: Self-pay

## 2022-04-17 DIAGNOSIS — J449 Chronic obstructive pulmonary disease, unspecified: Secondary | ICD-10-CM | POA: Insufficient documentation

## 2022-04-17 NOTE — Progress Notes (Signed)
Virtual Visit completed. Patient informed on EP and RD appointment and 6 Minute walk test. Patient also informed of patient health questionnaires on My Chart. Patient Verbalizes understanding. Visit diagnosis can be found in [facility] 01/13/2022.

## 2022-04-27 VITALS — Ht 60.2 in | Wt 195.9 lb

## 2022-04-27 DIAGNOSIS — J449 Chronic obstructive pulmonary disease, unspecified: Secondary | ICD-10-CM

## 2022-04-27 NOTE — Progress Notes (Addendum)
Pulmonary Individual Treatment Plan  Patient Details  Name: Autumn Ramos MRN: [id number] Date of Birth: [birth year] Referring Provider:   Flowsheet Row Pulmonary Rehab from 04/27/2022 in [facility] Cardiac and Pulmonary Rehab  Referring Provider Ottie Glazier MD       Initial Encounter Date:  Flowsheet Row Pulmonary Rehab from 04/27/2022 in [facility] Cardiac and Pulmonary Rehab  Date 04/27/22       Visit Diagnosis: Chronic obstructive pulmonary disease, unspecified COPD type ([place])  Patient's Home Medications on Admission:  Current Outpatient Medications:    Accu-Chek Softclix Lancets lancets, Accu-Chek Softclix Lancets, Disp: , Rfl:    albuterol (PROVENTIL HFA;VENTOLIN HFA) 108 (90 Base) MCG/ACT inhaler, Inhale 1-2 puffs into the lungs every 4 (four) hours as needed for wheezing or shortness of breath. (Patient not taking: Reported on 04/17/2022), Disp: , Rfl:    ALPRAZolam (XANAX) 0.25 MG tablet, alprazolam 0.25 mg tablet  PLEASE SEE ATTACHED FOR DETAILED DIRECTIONS, Disp: , Rfl:    aspirin EC 81 MG tablet, Take 81 mg by mouth daily. (Patient not taking: Reported on 04/17/2022), Disp: , Rfl:    atorvastatin (LIPITOR) 80 MG tablet, Take 80 mg by mouth at bedtime., Disp: , Rfl: 0   Cholecalciferol (VITAMIN D) 2000 units tablet, Take 2,000 Units by mouth daily. (Patient not taking: Reported on 04/17/2022), Disp: , Rfl:    Cholecalciferol (VITAMIN D3) 50 MCG (2000 UT) capsule, Take 2,000 Units by mouth daily., Disp: , Rfl:    cyclobenzaprine (FLEXERIL) 5 MG tablet, Take 5 mg by mouth 2 (two) times daily as needed., Disp: , Rfl:    dexlansoprazole (DEXILANT) 60 MG capsule, Take 60 mg by mouth daily. , Disp: , Rfl:    EPINEPHrine 0.3 mg/0.3 mL IJ SOAJ injection, epinephrine 0.3 mg/0.3 mL injection, auto-injector, Disp: , Rfl:    ezetimibe (ZETIA) 10 MG tablet, Take 10 mg by mouth daily., Disp: , Rfl:    Fluticasone-Salmeterol (ADVAIR) 500-50 MCG/DOSE AEPB, Inhale 1 puff into the lungs 2 (two) times  daily. (Patient not taking: Reported on 04/17/2022), Disp: , Rfl:    gabapentin (NEURONTIN) 300 MG capsule, Take 2 capsules by mouth at bedtime., Disp: , Rfl:    glipiZIDE (GLUCOTROL XL) 10 MG 24 hr tablet, glipizide ER 10 mg tablet, extended release 24 hr (Patient not taking: Reported on 04/17/2022), Disp: , Rfl:    glucose blood test strip, OneTouch Ultra Blue Test Strip  TEST BLOOD GLUCOSE LEVELS TWICE DAILY, Disp: , Rfl:    HYDROcodone-acetaminophen (NORCO/VICODIN) 5-325 MG tablet, Take by mouth., Disp: , Rfl:    ibuprofen (ADVIL,MOTRIN) 800 MG tablet, Take 800 mg by mouth every 6 (six) hours as needed for mild pain or moderate pain. , Disp: , Rfl:    ipratropium-albuterol (DUONEB) 0.5-2.5 (3) MG/3ML SOLN, Take 3 mLs by nebulization 3 (three) times daily as needed (for shortness of breath or wheezing). , Disp: , Rfl:    isometheptene-acetaminophen-dichloralphenazone (MIDRIN) 65-100-325 MG capsule, Take 1-2 capsules by mouth 2 (two) times daily as needed for migraine.  (Patient not taking: Reported on 04/17/2022), Disp: , Rfl:    JANUMET XR 50-500 MG TB24, Take 1 tablet by mouth daily., Disp: , Rfl:    levothyroxine (SYNTHROID) 50 MCG tablet, Take by mouth. (Patient not taking: Reported on 04/17/2022), Disp: , Rfl:    levothyroxine (SYNTHROID, LEVOTHROID) 100 MCG tablet, Take 100 mcg by mouth daily before breakfast., Disp: , Rfl:    lisinopril (PRINIVIL,ZESTRIL) 10 MG tablet, Take 1 tablet by mouth daily., Disp: ,  Rfl:    metoprolol succinate (TOPROL-XL) 25 MG 24 hr tablet, Take 1 tablet by mouth daily., Disp: , Rfl:    metoprolol succinate (TOPROL-XL) 25 MG 24 hr tablet, metoprolol succinate ER 25 mg tablet,extended release 24 hr  TAKE 1 TABLET BY MOUTH ONCE EVERY EVENING, Disp: , Rfl:    montelukast (SINGULAIR) 10 MG tablet, Take 10 mg by mouth at bedtime. (Patient not taking: Reported on 04/17/2022), Disp: , Rfl:    montelukast (SINGULAIR) 10 MG tablet, Take 1 tablet by mouth daily., Disp: , Rfl:     ondansetron (ZOFRAN) 8 MG tablet, Take 8 mg by mouth every 8 (eight) hours as needed for nausea or vomiting. , Disp: , Rfl:    pantoprazole (PROTONIX) 40 MG tablet, Take 40 mg by mouth daily., Disp: , Rfl:    pantoprazole (PROTONIX) 40 MG tablet, pantoprazole 40 mg tablet,delayed release  TAKE 1 TABLET BY MOUTH EVERY DAY (Patient not taking: Reported on 04/17/2022), Disp: , Rfl:    predniSONE (DELTASONE) 20 MG tablet, prednisone 20 mg tablet  TAKE 1 TABLET BY MOUTH EVERY DAY, Disp: , Rfl:    Semaglutide, 2 MG/DOSE, (OZEMPIC, 2 MG/DOSE,) 8 MG/3ML SOPN, Ozempic 2 mg/dose (8 mg/3 mL) subcutaneous pen injector, Disp: , Rfl:    TRELEGY ELLIPTA 100-62.5-25 MCG/INH AEPB, Take 1 puff by mouth daily. (Patient not taking: Reported on 04/17/2022), Disp: , Rfl:    TRELEGY ELLIPTA 200-62.5-25 MCG/ACT AEPB, Inhale 1 puff into the lungs daily., Disp: , Rfl:   Past Medical History: Past Medical History:  Diagnosis Date   Breast cancer ([place])    COPD (chronic obstructive pulmonary disease) ([place])    Diabetes mellitus without complication ([place])    H/O breast implant    Hypertension    Last menstrual period (LMP) > 10 days ago 2006    Tobacco Use: Social History   Tobacco Use  Smoking Status Former   Packs/day: 0.25   Years: 25.00   Total pack years: 6.25   Types: Cigarettes   Quit date: 06/14/2014   Years since quitting: 7.8  Smokeless Tobacco Never    Labs: Review Flowsheet       Latest Ref Rng & Units 03/08/2016  Labs for ITP Cardiac and Pulmonary Rehab  Hemoglobin A1c 4.0 - 6.0 % 7.0      Pulmonary Assessment Scores:  Pulmonary Assessment Scores     Row Name 04/27/22 1001         ADL UCSD   ADL Phase Entry     SOB Score total 72     Rest 2     Walk 3     Stairs 5     Bath 1     Dress 2     Shop 3       CAT Score   CAT Score 23       mMRC Score   mMRC Score 2              UCSD: Self-administered rating of dyspnea associated with activities of daily living  (ADLs) 6-point scale (0 = "not at all" to 5 = "maximal or unable to do because of breathlessness")  Scoring Scores range from 0 to 120.  Minimally important difference is 5 units  CAT: CAT can identify the health impairment of COPD patients and is better correlated with disease progression.  CAT has a scoring range of zero to 40. The CAT score is classified into four groups of low (less than 10), medium (  10 - 20), high (21-30) and very high (31-40) based on the impact level of disease on health status. A CAT score over 10 suggests significant symptoms.  A worsening CAT score could be explained by an exacerbation, poor medication adherence, poor inhaler technique, or progression of COPD or comorbid conditions.  CAT MCID is 2 points  mMRC: mMRC (Modified Medical Research Council) Dyspnea Scale is used to assess the degree of baseline functional disability in patients of respiratory disease due to dyspnea. No minimal important difference is established. A decrease in score of 1 point or greater is considered a positive change.   Pulmonary Function Assessment:  Pulmonary Function Assessment - 04/17/22 1001       Breath   Shortness of Breath Yes;Limiting activity             Exercise Target Goals: Exercise Program Goal: Individual exercise prescription set using results from initial 6 min walk test and THRR while considering  patient's activity barriers and safety.   Exercise Prescription Goal: Initial exercise prescription builds to 30-45 minutes a day of aerobic activity, 2-3 days per week.  Home exercise guidelines will be given to patient during program as part of exercise prescription that the participant will acknowledge.  Education: Aerobic Exercise: - Group verbal and visual presentation on the components of exercise prescription. Introduces F.I.T.T principle from ACSM for exercise prescriptions.  Reviews F.I.T.T. principles of aerobic exercise including progression. Written  material given at graduation.   Education: Resistance Exercise: - Group verbal and visual presentation on the components of exercise prescription. Introduces F.I.T.T principle from ACSM for exercise prescriptions  Reviews F.I.T.T. principles of resistance exercise including progression. Written material given at graduation.    Education: Exercise & Equipment Safety: - Individual verbal instruction and demonstration of equipment use and safety with use of the equipment. Flowsheet Row Pulmonary Rehab from 04/17/2022 in [facility] Cardiac and Pulmonary Rehab  Date 04/17/22  Educator [facility]  Instruction Review Code 1- Verbalizes Understanding       Education: Exercise Physiology & General Exercise Guidelines: - Group verbal and written instruction with models to review the exercise physiology of the cardiovascular system and associated critical values. Provides general exercise guidelines with specific guidelines to those with heart or lung disease.    Education: Flexibility, Balance, Mind/Body Relaxation: - Group verbal and visual presentation with interactive activity on the components of exercise prescription. Introduces F.I.T.T principle from ACSM for exercise prescriptions. Reviews F.I.T.T. principles of flexibility and balance exercise training including progression. Also discusses the mind body connection.  Reviews various relaxation techniques to help reduce and manage stress (i.e. Deep breathing, progressive muscle relaxation, and visualization). Balance handout provided to take home. Written material given at graduation.   Activity Barriers & Risk Stratification:  Activity Barriers & Cardiac Risk Stratification - 04/27/22 1108       Activity Barriers & Cardiac Risk Stratification   Activity Barriers Other (comment);Deconditioning;Muscular Weakness    Comments Limited ROM above head             6 Minute Walk:  6 Minute Walk     Row Name 04/27/22 1010         6 Minute Walk    Phase Initial     Distance 1260 feet     Walk Time 6 minutes     MPH 2.39     METS 2.37     RPE 12     Perceived Dyspnea  2     VO2 Peak 8.31  Symptoms Yes (comment)  SOB     Comments SOB     Resting HR 76 bpm     Resting BP 124/72     Resting Oxygen Saturation  94 %     Exercise Oxygen Saturation  during 6 min walk 89 %     Max Ex. HR 100 bpm     Max Ex. BP 118/68     2 Minute Post BP 112/70       Interval HR   1 Minute HR 85     2 Minute HR 94     3 Minute HR 94     4 Minute HR 98     5 Minute HR 97     6 Minute HR 100     2 Minute Post HR 77     Interval Heart Rate? Yes       Interval Oxygen   Interval Oxygen? Yes     Baseline Oxygen Saturation % 94 %     1 Minute Oxygen Saturation % 92 %     1 Minute Liters of Oxygen 0 L  RA     2 Minute Oxygen Saturation % 91 %     2 Minute Liters of Oxygen 0 L     3 Minute Oxygen Saturation % 89 %     3 Minute Liters of Oxygen 0 L     4 Minute Oxygen Saturation % 89 %     4 Minute Liters of Oxygen 0 L     5 Minute Oxygen Saturation % 90 %     5 Minute Liters of Oxygen 0 L     6 Minute Oxygen Saturation % 90 %     6 Minute Liters of Oxygen 0 L     2 Minute Post Oxygen Saturation % 96 %     2 Minute Post Liters of Oxygen 0 L             Oxygen Initial Assessment:  Oxygen Initial Assessment - 04/27/22 1110       Home Oxygen   Home Oxygen Device None    Sleep Oxygen Prescription None    Home Exercise Oxygen Prescription None    Home Resting Oxygen Prescription None    Compliance with Home Oxygen Use Yes      Initial 6 min Walk   Oxygen Used None      Program Oxygen Prescription   Program Oxygen Prescription None      Intervention   Short Term Goals To learn and understand importance of monitoring SPO2 with pulse oximeter and demonstrate accurate use of the pulse oximeter.;To learn and understand importance of maintaining oxygen saturations>88%;To learn and demonstrate proper use of respiratory medications;To  learn and demonstrate proper pursed lip breathing techniques or other breathing techniques.     Long  Term Goals Verbalizes importance of monitoring SPO2 with pulse oximeter and return demonstration;Maintenance of O2 saturations>88%;Exhibits proper breathing techniques, such as pursed lip breathing or other method taught during program session;Compliance with respiratory medication;Demonstrates proper use of MDI's             Oxygen Re-Evaluation:   Oxygen Discharge (Final Oxygen Re-Evaluation):   Initial Exercise Prescription:  Initial Exercise Prescription - 04/27/22 1100       Date of Initial Exercise RX and Referring Provider   Date 04/27/22    Referring Provider Ottie Glazier MD      Oxygen   Maintain Oxygen Saturation 88% or higher  Treadmill   MPH 2.2    Grade 0    Minutes 15    METs 2.68      NuStep   Level 2    SPM 80    Minutes 15    METs 2.3      REL-XR   Level 1    Speed 50    Minutes 15    METs 2.3      Prescription Details   Frequency (times per week) 3    Duration Progress to 30 minutes of continuous aerobic without signs/symptoms of physical distress      Intensity   THRR 40-80% of Max Heartrate 107 - 139    Ratings of Perceived Exertion 11-13    Perceived Dyspnea 0-4      Progression   Progression Continue to progress workloads to maintain intensity without signs/symptoms of physical distress.      Resistance Training   Training Prescription Yes    Weight 3 lb    Reps 10-15             Perform Capillary Blood Glucose checks as needed.  Exercise Prescription Changes:   Exercise Prescription Changes     Row Name 04/27/22 1100             Response to Exercise   Blood Pressure (Admit) 124/72       Blood Pressure (Exercise) 118/68       Blood Pressure (Exit) 112/70       Heart Rate (Admit) 76 bpm       Heart Rate (Exercise) 100 bpm       Heart Rate (Exit) 77 bpm       Oxygen Saturation (Admit) 94 %       Oxygen  Saturation (Exercise) 89 %       Oxygen Saturation (Exit) 96 %       Rating of Perceived Exertion (Exercise) 12       Perceived Dyspnea (Exercise) 2       Symptoms SOB       Comments walk test results                Exercise Comments:   Exercise Goals and Review:   Exercise Goals     Row Name 04/27/22 1126             Exercise Goals   Increase Physical Activity Yes       Intervention Provide advice, education, support and counseling about physical activity/exercise needs.;Develop an individualized exercise prescription for aerobic and resistive training based on initial evaluation findings, risk stratification, comorbidities and participant's personal goals.       Expected Outcomes Short Term: Attend rehab on a regular basis to increase amount of physical activity.;Long Term: Add in home exercise to make exercise part of routine and to increase amount of physical activity.;Long Term: Exercising regularly at least 3-5 days a week.       Increase Strength and Stamina Yes       Intervention Provide advice, education, support and counseling about physical activity/exercise needs.;Develop an individualized exercise prescription for aerobic and resistive training based on initial evaluation findings, risk stratification, comorbidities and participant's personal goals.       Expected Outcomes Short Term: Increase workloads from initial exercise prescription for resistance, speed, and METs.;Short Term: Perform resistance training exercises routinely during rehab and add in resistance training at home;Long Term: Improve cardiorespiratory fitness, muscular endurance and strength as measured by increased METs and functional  capacity (6MWT)       Able to understand and use rate of perceived exertion (RPE) scale Yes       Intervention Provide education and explanation on how to use RPE scale       Expected Outcomes Short Term: Able to use RPE daily in rehab to express subjective intensity  level;Long Term:  Able to use RPE to guide intensity level when exercising independently       Able to understand and use Dyspnea scale Yes       Intervention Provide education and explanation on how to use Dyspnea scale       Expected Outcomes Short Term: Able to use Dyspnea scale daily in rehab to express subjective sense of shortness of breath during exertion;Long Term: Able to use Dyspnea scale to guide intensity level when exercising independently       Knowledge and understanding of Target Heart Rate Range (THRR) Yes       Intervention Provide education and explanation of THRR including how the numbers were predicted and where they are located for reference       Expected Outcomes Short Term: Able to state/look up THRR;Long Term: Able to use THRR to govern intensity when exercising independently;Short Term: Able to use daily as guideline for intensity in rehab       Able to check pulse independently Yes       Intervention Provide education and demonstration on how to check pulse in carotid and radial arteries.;Review the importance of being able to check your own pulse for safety during independent exercise       Expected Outcomes Short Term: Able to explain why pulse checking is important during independent exercise;Long Term: Able to check pulse independently and accurately       Understanding of Exercise Prescription Yes       Intervention Provide education, explanation, and written materials on patient's individual exercise prescription       Expected Outcomes Short Term: Able to explain program exercise prescription;Long Term: Able to explain home exercise prescription to exercise independently                Exercise Goals Re-Evaluation :   Discharge Exercise Prescription (Final Exercise Prescription Changes):  Exercise Prescription Changes - 04/27/22 1100       Response to Exercise   Blood Pressure (Admit) 124/72    Blood Pressure (Exercise) 118/68    Blood Pressure (Exit)  112/70    Heart Rate (Admit) 76 bpm    Heart Rate (Exercise) 100 bpm    Heart Rate (Exit) 77 bpm    Oxygen Saturation (Admit) 94 %    Oxygen Saturation (Exercise) 89 %    Oxygen Saturation (Exit) 96 %    Rating of Perceived Exertion (Exercise) 12    Perceived Dyspnea (Exercise) 2    Symptoms SOB    Comments walk test results             Nutrition:  Target Goals: Understanding of nutrition guidelines, daily intake of sodium '1500mg'$ , cholesterol '200mg'$ , calories 30% from fat and 7% or less from saturated fats, daily to have 5 or more servings of fruits and vegetables.  Education: All About Nutrition: -Group instruction provided by verbal, written material, interactive activities, discussions, models, and posters to present general guidelines for heart healthy nutrition including fat, fiber, MyPlate, the role of sodium in heart healthy nutrition, utilization of the nutrition label, and utilization of this knowledge for meal planning. Follow up email  sent as well. Written material given at graduation.   Biometrics:  Pre Biometrics - 04/27/22 1108       Pre Biometrics   Height 5' 0.2" (1.529 m)    Weight 195 lb 14.4 oz (88.9 kg)    BMI (Calculated) 38.01    Single Leg Stand 13.3 seconds              Nutrition Therapy Plan and Nutrition Goals:  Nutrition Therapy & Goals - 04/27/22 1110       Intervention Plan   Intervention Prescribe, educate and counsel regarding individualized specific dietary modifications aiming towards targeted core components such as weight, hypertension, lipid management, diabetes, heart failure and other comorbidities.    Expected Outcomes Short Term Goal: Understand basic principles of dietary content, such as calories, fat, sodium, cholesterol and nutrients.;Short Term Goal: A plan has been developed with personal nutrition goals set during dietitian appointment.;Long Term Goal: Adherence to prescribed nutrition plan.             Nutrition  Assessments:  MEDIFICTS Score Key: ?70 Need to make dietary changes  40-70 Heart Healthy Diet ? 40 Therapeutic Level Cholesterol Diet  Flowsheet Row Pulmonary Rehab from 04/27/2022 in [facility] Cardiac and Pulmonary Rehab  Picture Your Plate Total Score on Admission 51      Picture Your Plate Scores: <54 Unhealthy dietary pattern with much room for improvement. 41-50 Dietary pattern unlikely to meet recommendations for good health and room for improvement. 51-60 More healthful dietary pattern, with some room for improvement.  >60 Healthy dietary pattern, although there may be some specific behaviors that could be improved.   Nutrition Goals Re-Evaluation:   Nutrition Goals Discharge (Final Nutrition Goals Re-Evaluation):   Psychosocial: Target Goals: Acknowledge presence or absence of significant depression and/or stress, maximize coping skills, provide positive support system. Participant is able to verbalize types and ability to use techniques and skills needed for reducing stress and depression.   Education: Stress, Anxiety, and Depression - Group verbal and visual presentation to define topics covered.  Reviews how body is impacted by stress, anxiety, and depression.  Also discusses healthy ways to reduce stress and to treat/manage anxiety and depression.  Written material given at graduation.   Education: Sleep Hygiene -Provides group verbal and written instruction about how sleep can affect your health.  Define sleep hygiene, discuss sleep cycles and impact of sleep habits. Review good sleep hygiene tips.    Initial Review & Psychosocial Screening:  Initial Psych Review & Screening - 04/17/22 1003       Initial Review   Current issues with History of Depression      Family Dynamics   Good Support System? Yes    Comments Autumn Ramos can look to her son and her sister for support. Other than these two people she does not have any other sources of support. She walks with her dog but  is not able to go very far and is ready to improve her shortness of breath.      Barriers   Psychosocial barriers to participate in program The patient should benefit from training in stress management and relaxation.      Screening Interventions   Interventions Provide feedback about the scores to participant;Encouraged to exercise;To provide support and resources with identified psychosocial needs    Expected Outcomes Short Term goal: Utilizing psychosocial counselor, staff and physician to assist with identification of specific Stressors or current issues interfering with healing process. Setting desired goal for each  stressor or current issue identified.;Long Term Goal: Stressors or current issues are controlled or eliminated.;Short Term goal: Identification and review with participant of any Quality of Life or Depression concerns found by scoring the questionnaire.;Long Term goal: The participant improves quality of Life and PHQ9 Scores as seen by post scores and/or verbalization of changes             Quality of Life Scores:  Scores of 19 and below usually indicate a poorer quality of life in these areas.  A difference of  2-3 points is a clinically meaningful difference.  A difference of 2-3 points in the total score of the Quality of Life Index has been associated with significant improvement in overall quality of life, self-image, physical symptoms, and general health in studies assessing change in quality of life.  PHQ-9: Review Flowsheet       04/27/2022  Depression screen PHQ 2/9  Decreased Interest 1  Down, Depressed, Hopeless 1  PHQ - 2 Score 2  Altered sleeping 2  Tired, decreased energy 2  Change in appetite 1  Feeling bad or failure about yourself  1  Trouble concentrating 0  Moving slowly or fidgety/restless 0  Suicidal thoughts 0  PHQ-9 Score 8  Difficult doing work/chores Very difficult   Interpretation of Total Score  Total Score Depression Severity:  1-4 =  Minimal depression, 5-9 = Mild depression, 10-14 = Moderate depression, 15-19 = Moderately severe depression, 20-27 = Severe depression   Psychosocial Evaluation and Intervention:  Psychosocial Evaluation - 04/17/22 1005       Psychosocial Evaluation & Interventions   Interventions Encouraged to exercise with the program and follow exercise prescription;Relaxation education;Stress management education    Comments Autumn Ramos can look to her son and her sister for support. Other than these two people she does not have any other sources of support. She walks with her dog but is not able to go very far and is ready to improve her shortness of breath.    Expected Outcomes Short: Start [facility] to help with mood. Long: Maintain a healthy mental state.    Continue Psychosocial Services  Follow up required by staff             Psychosocial Re-Evaluation:   Psychosocial Discharge (Final Psychosocial Re-Evaluation):   Education: Education Goals: Education classes will be provided on a weekly basis, covering required topics. Participant will state understanding/return demonstration of topics presented.  Learning Barriers/Preferences:  Learning Barriers/Preferences - 04/17/22 1001       Learning Barriers/Preferences   Learning Barriers None    Learning Preferences None             General Pulmonary Education Topics:  Infection Prevention: - Provides verbal and written material to individual with discussion of infection control including proper hand washing and proper equipment cleaning during exercise session. Flowsheet Row Pulmonary Rehab from 04/17/2022 in [facility] Cardiac and Pulmonary Rehab  Date 04/17/22  Educator [facility]  Instruction Review Code 1- Verbalizes Understanding       Falls Prevention: - Provides verbal and written material to individual with discussion of falls prevention and safety. Flowsheet Row Pulmonary Rehab from 04/17/2022 in [facility] Cardiac and Pulmonary Rehab  Date  04/17/22  Educator [facility]  Instruction Review Code 1- Verbalizes Understanding       Chronic Lung Disease Review: - Group verbal instruction with posters, models, PowerPoint presentations and videos,  to review new updates, new respiratory medications, new advancements in procedures and treatments. Providing information on  websites and "800" numbers for continued self-education. Includes information about supplement oxygen, available portable oxygen systems, continuous and intermittent flow rates, oxygen safety, concentrators, and Medicare reimbursement for oxygen. Explanation of Pulmonary Drugs, including class, frequency, complications, importance of spacers, rinsing mouth after steroid MDI's, and proper cleaning methods for nebulizers. Review of basic lung anatomy and physiology related to function, structure, and complications of lung disease. Review of risk factors. Discussion about methods for diagnosing sleep apnea and types of masks and machines for OSA. Includes a review of the use of types of environmental controls: home humidity, furnaces, filters, dust mite/pet prevention, HEPA vacuums. Discussion about weather changes, air quality and the benefits of nasal washing. Instruction on Warning signs, infection symptoms, calling MD promptly, preventive modes, and value of vaccinations. Review of effective airway clearance, coughing and/or vibration techniques. Emphasizing that all should Create an Action Plan. Written material given at graduation.   AED/CPR: - Group verbal and written instruction with the use of models to demonstrate the basic use of the AED with the basic ABC's of resuscitation.    Anatomy and Cardiac Procedures: - Group verbal and visual presentation and models provide information about basic cardiac anatomy and function. Reviews the testing methods done to diagnose heart disease and the outcomes of the test results. Describes the treatment choices: Medical Management,  Angioplasty, or Coronary Bypass Surgery for treating various heart conditions including Myocardial Infarction, Angina, Valve Disease, and Cardiac Arrhythmias.  Written material given at graduation.   Medication Safety: - Group verbal and visual instruction to review commonly prescribed medications for heart and lung disease. Reviews the medication, class of the drug, and side effects. Includes the steps to properly store meds and maintain the prescription regimen.  Written material given at graduation.   Other: -Provides group and verbal instruction on various topics (see comments)   Knowledge Questionnaire Score:  Knowledge Questionnaire Score - 04/27/22 0951       Knowledge Questionnaire Score   Pre Score 17/18              Core Components/Risk Factors/Patient Goals at Admission:  Personal Goals and Risk Factors at Admission - 04/27/22 1127       Core Components/Risk Factors/Patient Goals on Admission    Weight Management Yes;Weight Loss;Obesity    Intervention Weight Management: Develop a combined nutrition and exercise program designed to reach desired caloric intake, while maintaining appropriate intake of nutrient and fiber, sodium and fats, and appropriate energy expenditure required for the weight goal.;Weight Management: Provide education and appropriate resources to help participant work on and attain dietary goals.;Weight Management/Obesity: Establish reasonable short term and long term weight goals.    Admit Weight 195 lb (88.5 kg)    Goal Weight: Short Term 190 lb (86.2 kg)    Goal Weight: Long Term 170 lb (77.1 kg)    Expected Outcomes Short Term: Continue to assess and modify interventions until short term weight is achieved;Long Term: Adherence to nutrition and physical activity/exercise program aimed toward attainment of established weight goal;Weight Loss: Understanding of general recommendations for a balanced deficit meal plan, which promotes 1-2 lb weight loss  per week and includes a negative energy balance of [phone] kcal/d;Understanding recommendations for meals to include 15-35% energy as protein, 25-35% energy from fat, 35-60% energy from carbohydrates, less than '200mg'$  of dietary cholesterol, 20-35 gm of total fiber daily;Understanding of distribution of calorie intake throughout the day with the consumption of 4-5 meals/snacks    Improve shortness of breath with ADL's  Yes    Intervention Provide education, individualized exercise plan and daily activity instruction to help decrease symptoms of SOB with activities of daily living.    Expected Outcomes Short Term: Improve cardiorespiratory fitness to achieve a reduction of symptoms when performing ADLs;Long Term: Be able to perform more ADLs without symptoms or delay the onset of symptoms    Diabetes Yes    Intervention Provide education about signs/symptoms and action to take for hypo/hyperglycemia.;Provide education about proper nutrition, including hydration, and aerobic/resistive exercise prescription along with prescribed medications to achieve blood glucose in normal ranges: Fasting glucose 65-99 mg/dL    Expected Outcomes Short Term: Participant verbalizes understanding of the signs/symptoms and immediate care of hyper/hypoglycemia, proper foot care and importance of medication, aerobic/resistive exercise and nutrition plan for blood glucose control.;Long Term: Attainment of HbA1C < 7%.    Hypertension Yes    Intervention Provide education on lifestyle modifcations including regular physical activity/exercise, weight management, moderate sodium restriction and increased consumption of fresh fruit, vegetables, and low fat dairy, alcohol moderation, and smoking cessation.;Monitor prescription use compliance.    Expected Outcomes Short Term: Continued assessment and intervention until BP is < 140/18m HG in hypertensive participants. < 130/815mHG in hypertensive participants with diabetes, heart failure  or chronic kidney disease.;Long Term: Maintenance of blood pressure at goal levels.    Lipids Yes    Intervention Provide education and support for participant on nutrition & aerobic/resistive exercise along with prescribed medications to achieve LDL '70mg'$ , HDL >'40mg'$ .    Expected Outcomes Short Term: Participant states understanding of desired cholesterol values and is compliant with medications prescribed. Participant is following exercise prescription and nutrition guidelines.;Long Term: Cholesterol controlled with medications as prescribed, with individualized exercise RX and with personalized nutrition plan. Value goals: LDL < '70mg'$ , HDL > 40 mg.             Education:Diabetes - Individual verbal and written instruction to review signs/symptoms of diabetes, desired ranges of glucose level fasting, after meals and with exercise. Acknowledge that pre and post exercise glucose checks will be done for 3 sessions at entry of program. [facility]  Date 04/17/22  [facility]- Verbalizes Understanding       Know Your Numbers and Heart Failure: - Group verbal and visual instruction to discuss disease risk factors for cardiac and pulmonary disease and treatment options.  Reviews associated critical values for Overweight/Obesity, Hypertension, Cholesterol, and Diabetes.  Discusses basics of heart failure: signs/symptoms and treatments.  Introduces Heart Failure Zone chart for action plan for heart failure.  Written material given at graduation.   Core Components/Risk Factors/Patient Goals Review:    Core Components/Risk Factors/Patient Goals at Discharge (Final Review):    ITP Comments:  ITP Comments     Row Name 04/17/22 0959 04/27/22 1105         ITP Comments Virtual Visit completed. Patient informed on EP and RD appointment and 6 Minute walk test. Patient also informed of patient health  questionnaires on My Chart. Patient Verbalizes understanding. [facility] Completed 6MWT and gym orientation. Initial ITP created and sent for review to Dr. FuOttie GlazierMedical Director.               Comments: Initial ITP

## 2022-04-27 NOTE — Patient Instructions (Signed)
Patient Instructions  Patient Details  Name: Autumn Ramos MRN: [id number] Date of Birth: [birth year] Referring Provider:  Ottie Glazier, MD  Below are your personal goals for exercise, nutrition, and risk factors. Our goal is to help you stay on track towards obtaining and maintaining these goals. We will be discussing your progress on these goals with you throughout the program.  Initial Exercise Prescription:  Initial Exercise Prescription - 04/27/22 1100       Date of Initial Exercise RX and Referring Provider   Date 04/27/22    Referring Provider Ottie Glazier MD      Oxygen   Maintain Oxygen Saturation 88% or higher      Treadmill   MPH 2.2    Grade 0    Minutes 15    METs 2.68      NuStep   Level 2    SPM 80    Minutes 15    METs 2.3      REL-XR   Level 1    Speed 50    Minutes 15    METs 2.3      Prescription Details   Frequency (times per week) 3    Duration Progress to 30 minutes of continuous aerobic without signs/symptoms of physical distress      Intensity   THRR 40-80% of Max Heartrate 107 - 139    Ratings of Perceived Exertion 11-13    Perceived Dyspnea 0-4      Progression   Progression Continue to progress workloads to maintain intensity without signs/symptoms of physical distress.      Resistance Training   Training Prescription Yes    Weight 3 lb    Reps 10-15             Exercise Goals: Frequency: Be able to perform aerobic exercise two to three times per week in program working toward 2-5 days per week of home exercise.  Intensity: Work with a perceived exertion of 11 (fairly light) - 15 (hard) while following your exercise prescription.  We will make changes to your prescription with you as you progress through the program.   Duration: Be able to do 30 to 45 minutes of continuous aerobic exercise in addition to a 5 minute warm-up and a 5 minute cool-down routine.   Nutrition Goals: Your personal nutrition goals will be  established when you do your nutrition analysis with the dietician.  The following are general nutrition guidelines to follow: Cholesterol < '200mg'$ /day Sodium < '1500mg'$ /day Fiber: Women over 50 yrs - 21 grams per day  Personal Goals:  Personal Goals and Risk Factors at Admission - 04/27/22 1127       Core Components/Risk Factors/Patient Goals on Admission    Weight Management Yes;Weight Loss;Obesity    Intervention Weight Management: Develop a combined nutrition and exercise program designed to reach desired caloric intake, while maintaining appropriate intake of nutrient and fiber, sodium and fats, and appropriate energy expenditure required for the weight goal.;Weight Management: Provide education and appropriate resources to help participant work on and attain dietary goals.;Weight Management/Obesity: Establish reasonable short term and long term weight goals.    Admit Weight 195 lb (88.5 kg)    Goal Weight: Short Term 190 lb (86.2 kg)    Goal Weight: Long Term 170 lb (77.1 kg)    Expected Outcomes Short Term: Continue to assess and modify interventions until short term weight is achieved;Long Term: Adherence to nutrition and physical activity/exercise program aimed toward attainment of established  weight goal;Weight Loss: Understanding of general recommendations for a balanced deficit meal plan, which promotes 1-2 lb weight loss per week and includes a negative energy balance of [phone] kcal/d;Understanding recommendations for meals to include 15-35% energy as protein, 25-35% energy from fat, 35-60% energy from carbohydrates, less than '200mg'$  of dietary cholesterol, 20-35 gm of total fiber daily;Understanding of distribution of calorie intake throughout the day with the consumption of 4-5 meals/snacks    Improve shortness of breath with ADL's Yes    Intervention Provide education, individualized exercise plan and daily activity instruction to help decrease symptoms of SOB with activities of  daily living.    Expected Outcomes Short Term: Improve cardiorespiratory fitness to achieve a reduction of symptoms when performing ADLs;Long Term: Be able to perform more ADLs without symptoms or delay the onset of symptoms    Diabetes Yes    Intervention Provide education about signs/symptoms and action to take for hypo/hyperglycemia.;Provide education about proper nutrition, including hydration, and aerobic/resistive exercise prescription along with prescribed medications to achieve blood glucose in normal ranges: Fasting glucose 65-99 mg/dL    Expected Outcomes Short Term: Participant verbalizes understanding of the signs/symptoms and immediate care of hyper/hypoglycemia, proper foot care and importance of medication, aerobic/resistive exercise and nutrition plan for blood glucose control.;Long Term: Attainment of HbA1C < 7%.    Hypertension Yes    Intervention Provide education on lifestyle modifcations including regular physical activity/exercise, weight management, moderate sodium restriction and increased consumption of fresh fruit, vegetables, and low fat dairy, alcohol moderation, and smoking cessation.;Monitor prescription use compliance.    Expected Outcomes Short Term: Continued assessment and intervention until BP is < 140/70m HG in hypertensive participants. < 130/838mHG in hypertensive participants with diabetes, heart failure or chronic kidney disease.;Long Term: Maintenance of blood pressure at goal levels.    Lipids Yes    Intervention Provide education and support for participant on nutrition & aerobic/resistive exercise along with prescribed medications to achieve LDL '70mg'$ , HDL >'40mg'$ .    Expected Outcomes Short Term: Participant states understanding of desired cholesterol values and is compliant with medications prescribed. Participant is following exercise prescription and nutrition guidelines.;Long Term: Cholesterol controlled with medications as prescribed, with individualized  exercise RX and with personalized nutrition plan. Value goals: LDL < '70mg'$ , HDL > 40 mg.             Tobacco Use Initial Evaluation: Social History   Tobacco Use  Smoking Status Former   Packs/day: 0.25   Years: 25.00   Total pack years: 6.25   Types: Cigarettes   Quit date: 06/14/2014   Years since quitting: 7.8  Smokeless Tobacco Never    Exercise Goals and Review:  Exercise Goals     Row Name 04/27/22 1126             Exercise Goals   Increase Physical Activity Yes       Intervention Provide advice, education, support and counseling about physical activity/exercise needs.;Develop an individualized exercise prescription for aerobic and resistive training based on initial evaluation findings, risk stratification, comorbidities and participant's personal goals.       Expected Outcomes Short Term: Attend rehab on a regular basis to increase amount of physical activity.;Long Term: Add in home exercise to make exercise part of routine and to increase amount of physical activity.;Long Term: Exercising regularly at least 3-5 days a week.       Increase Strength and Stamina Yes       Intervention Provide advice, education, support  and counseling about physical activity/exercise needs.;Develop an individualized exercise prescription for aerobic and resistive training based on initial evaluation findings, risk stratification, comorbidities and participant's personal goals.       Expected Outcomes Short Term: Increase workloads from initial exercise prescription for resistance, speed, and METs.;Short Term: Perform resistance training exercises routinely during rehab and add in resistance training at home;Long Term: Improve cardiorespiratory fitness, muscular endurance and strength as measured by increased METs and functional capacity (6MWT)       Able to understand and use rate of perceived exertion (RPE) scale Yes       Intervention Provide education and explanation on how to use RPE  scale       Expected Outcomes Short Term: Able to use RPE daily in rehab to express subjective intensity level;Long Term:  Able to use RPE to guide intensity level when exercising independently       Able to understand and use Dyspnea scale Yes       Intervention Provide education and explanation on how to use Dyspnea scale       Expected Outcomes Short Term: Able to use Dyspnea scale daily in rehab to express subjective sense of shortness of breath during exertion;Long Term: Able to use Dyspnea scale to guide intensity level when exercising independently       Knowledge and understanding of Target Heart Rate Range (THRR) Yes       Intervention Provide education and explanation of THRR including how the numbers were predicted and where they are located for reference       Expected Outcomes Short Term: Able to state/look up THRR;Long Term: Able to use THRR to govern intensity when exercising independently;Short Term: Able to use daily as guideline for intensity in rehab       Able to check pulse independently Yes       Intervention Provide education and demonstration on how to check pulse in carotid and radial arteries.;Review the importance of being able to check your own pulse for safety during independent exercise       Expected Outcomes Short Term: Able to explain why pulse checking is important during independent exercise;Long Term: Able to check pulse independently and accurately       Understanding of Exercise Prescription Yes       Intervention Provide education, explanation, and written materials on patient's individual exercise prescription       Expected Outcomes Short Term: Able to explain program exercise prescription;Long Term: Able to explain home exercise prescription to exercise independently                Copy of goals given to participant.

## 2022-05-09 ENCOUNTER — Encounter: Payer: Medicare Other | Attending: Pulmonary Disease

## 2022-05-09 DIAGNOSIS — Z5189 Encounter for other specified aftercare: Secondary | ICD-10-CM | POA: Diagnosis not present

## 2022-05-09 DIAGNOSIS — J449 Chronic obstructive pulmonary disease, unspecified: Secondary | ICD-10-CM | POA: Insufficient documentation

## 2022-05-09 LAB — GLUCOSE, CAPILLARY
Glucose-Capillary: 216 mg/dL — ABNORMAL HIGH (ref 70–99)
Glucose-Capillary: 173 mg/dL — ABNORMAL HIGH (ref 70–99)

## 2022-05-09 NOTE — Progress Notes (Signed)
Daily Session Note  Patient Details  Name: Autumn Ramos MRN: [id number] Date of Birth: [birth year] Referring Provider:   Flowsheet Row Pulmonary Rehab from 04/27/2022 in [facility] Cardiac and Pulmonary Rehab  Referring Provider Ottie Glazier MD       Encounter Date: 05/09/2022  Check In:  Session Check In - 05/09/22 0755       Check-In   Supervising physician immediately available to respond to emergencies See telemetry face sheet for immediately available ER MD    Location [facility]-Cardiac & Pulmonary Rehab    Staff Present Earlean Shawl, BS, ACSM CEP, Exercise Physiologist;Jessica Luan Pulling, MA, RCEP, CCRP, Kathaleen Maser, MPA, RN    Virtual Visit No    Medication changes reported     No    Fall or balance concerns reported    No    Tobacco Cessation No Change    Warm-up and Cool-down Performed on first and last piece of equipment    Resistance Training Performed Yes    VAD Patient? No    PAD/SET Patient? No      Pain Assessment   Currently in Pain? No/denies                Social History   Tobacco Use  Smoking Status Former   Packs/day: 0.25   Years: 25.00   Total pack years: 6.25   Types: Cigarettes   Quit date: 06/14/2014   Years since quitting: 7.9  Smokeless Tobacco Never    Goals Met:  Independence with exercise equipment Exercise tolerated well No report of concerns or symptoms today Strength training completed today  Goals Unmet:  Not Applicable  Comments: First full day of exercise!  Patient was oriented to gym and equipment including functions, settings, policies, and procedures.  Patient's individual exercise prescription and treatment plan were reviewed.  All starting workloads were established based on the results of the 6 minute walk test done at initial orientation visit.  The plan for exercise progression was also introduced and progression will be customized based on patient's performance and goals.    Dr. Emily Filbert is Medical  Director for [place].  Dr. Ottie Glazier is Medical Director for [facility] Pulmonary Rehabilitation.

## 2022-05-10 ENCOUNTER — Encounter: Payer: Self-pay | Admitting: *Deleted

## 2022-05-10 DIAGNOSIS — J449 Chronic obstructive pulmonary disease, unspecified: Secondary | ICD-10-CM

## 2022-05-10 NOTE — Progress Notes (Signed)
Pulmonary Individual Treatment Plan  Patient Details  Name: Autumn Ramos MRN: [id number] Date of Birth: [birth year] Referring Provider:   Flowsheet Row Pulmonary Rehab from 04/27/2022 in [facility] Cardiac and Pulmonary Rehab  Referring Provider Ottie Glazier MD       Initial Encounter Date:  Flowsheet Row Pulmonary Rehab from 04/27/2022 in [facility] Cardiac and Pulmonary Rehab  Date 04/27/22       Visit Diagnosis: Chronic obstructive pulmonary disease, unspecified COPD type ([place])  Patient's Home Medications on Admission:  Current Outpatient Medications:    Accu-Chek Softclix Lancets lancets, Accu-Chek Softclix Lancets, Disp: , Rfl:    albuterol (PROVENTIL HFA;VENTOLIN HFA) 108 (90 Base) MCG/ACT inhaler, Inhale 1-2 puffs into the lungs every 4 (four) hours as needed for wheezing or shortness of breath. (Patient not taking: Reported on 04/17/2022), Disp: , Rfl:    ALPRAZolam (XANAX) 0.25 MG tablet, alprazolam 0.25 mg tablet  PLEASE SEE ATTACHED FOR DETAILED DIRECTIONS, Disp: , Rfl:    aspirin EC 81 MG tablet, Take 81 mg by mouth daily. (Patient not taking: Reported on 04/17/2022), Disp: , Rfl:    atorvastatin (LIPITOR) 80 MG tablet, Take 80 mg by mouth at bedtime., Disp: , Rfl: 0   Cholecalciferol (VITAMIN D) 2000 units tablet, Take 2,000 Units by mouth daily. (Patient not taking: Reported on 04/17/2022), Disp: , Rfl:    Cholecalciferol (VITAMIN D3) 50 MCG (2000 UT) capsule, Take 2,000 Units by mouth daily., Disp: , Rfl:    cyclobenzaprine (FLEXERIL) 5 MG tablet, Take 5 mg by mouth 2 (two) times daily as needed., Disp: , Rfl:    dexlansoprazole (DEXILANT) 60 MG capsule, Take 60 mg by mouth daily. , Disp: , Rfl:    EPINEPHrine 0.3 mg/0.3 mL IJ SOAJ injection, epinephrine 0.3 mg/0.3 mL injection, auto-injector, Disp: , Rfl:    ezetimibe (ZETIA) 10 MG tablet, Take 10 mg by mouth daily., Disp: , Rfl:    Fluticasone-Salmeterol (ADVAIR) 500-50 MCG/DOSE AEPB, Inhale 1 puff into the lungs 2 (two) times  daily. (Patient not taking: Reported on 04/17/2022), Disp: , Rfl:    gabapentin (NEURONTIN) 300 MG capsule, Take 2 capsules by mouth at bedtime., Disp: , Rfl:    glipiZIDE (GLUCOTROL XL) 10 MG 24 hr tablet, glipizide ER 10 mg tablet, extended release 24 hr (Patient not taking: Reported on 04/17/2022), Disp: , Rfl:    glucose blood test strip, OneTouch Ultra Blue Test Strip  TEST BLOOD GLUCOSE LEVELS TWICE DAILY, Disp: , Rfl:    HYDROcodone-acetaminophen (NORCO/VICODIN) 5-325 MG tablet, Take by mouth., Disp: , Rfl:    ibuprofen (ADVIL,MOTRIN) 800 MG tablet, Take 800 mg by mouth every 6 (six) hours as needed for mild pain or moderate pain. , Disp: , Rfl:    ipratropium-albuterol (DUONEB) 0.5-2.5 (3) MG/3ML SOLN, Take 3 mLs by nebulization 3 (three) times daily as needed (for shortness of breath or wheezing). , Disp: , Rfl:    isometheptene-acetaminophen-dichloralphenazone (MIDRIN) 65-100-325 MG capsule, Take 1-2 capsules by mouth 2 (two) times daily as needed for migraine.  (Patient not taking: Reported on 04/17/2022), Disp: , Rfl:    JANUMET XR 50-500 MG TB24, Take 1 tablet by mouth daily., Disp: , Rfl:    levothyroxine (SYNTHROID) 50 MCG tablet, Take by mouth. (Patient not taking: Reported on 04/17/2022), Disp: , Rfl:    levothyroxine (SYNTHROID, LEVOTHROID) 100 MCG tablet, Take 100 mcg by mouth daily before breakfast., Disp: , Rfl:    lisinopril (PRINIVIL,ZESTRIL) 10 MG tablet, Take 1 tablet by mouth daily., Disp: ,  Rfl:    metoprolol succinate (TOPROL-XL) 25 MG 24 hr tablet, Take 1 tablet by mouth daily., Disp: , Rfl:    metoprolol succinate (TOPROL-XL) 25 MG 24 hr tablet, metoprolol succinate ER 25 mg tablet,extended release 24 hr  TAKE 1 TABLET BY MOUTH ONCE EVERY EVENING, Disp: , Rfl:    montelukast (SINGULAIR) 10 MG tablet, Take 10 mg by mouth at bedtime. (Patient not taking: Reported on 04/17/2022), Disp: , Rfl:    montelukast (SINGULAIR) 10 MG tablet, Take 1 tablet by mouth daily., Disp: , Rfl:     ondansetron (ZOFRAN) 8 MG tablet, Take 8 mg by mouth every 8 (eight) hours as needed for nausea or vomiting. , Disp: , Rfl:    pantoprazole (PROTONIX) 40 MG tablet, Take 40 mg by mouth daily., Disp: , Rfl:    pantoprazole (PROTONIX) 40 MG tablet, pantoprazole 40 mg tablet,delayed release  TAKE 1 TABLET BY MOUTH EVERY DAY (Patient not taking: Reported on 04/17/2022), Disp: , Rfl:    predniSONE (DELTASONE) 20 MG tablet, prednisone 20 mg tablet  TAKE 1 TABLET BY MOUTH EVERY DAY, Disp: , Rfl:    Semaglutide, 2 MG/DOSE, (OZEMPIC, 2 MG/DOSE,) 8 MG/3ML SOPN, Ozempic 2 mg/dose (8 mg/3 mL) subcutaneous pen injector, Disp: , Rfl:    TRELEGY ELLIPTA 100-62.5-25 MCG/INH AEPB, Take 1 puff by mouth daily. (Patient not taking: Reported on 04/17/2022), Disp: , Rfl:    TRELEGY ELLIPTA 200-62.5-25 MCG/ACT AEPB, Inhale 1 puff into the lungs daily., Disp: , Rfl:   Past Medical History: Past Medical History:  Diagnosis Date   Breast cancer ([place])    COPD (chronic obstructive pulmonary disease) ([place])    Diabetes mellitus without complication ([place])    H/O breast implant    Hypertension    Last menstrual period (LMP) > 10 days ago 2006    Tobacco Use: Social History   Tobacco Use  Smoking Status Former   Packs/day: 0.25   Years: 25.00   Total pack years: 6.25   Types: Cigarettes   Quit date: 06/14/2014   Years since quitting: 7.9  Smokeless Tobacco Never    Labs: Review Flowsheet       Latest Ref Rng & Units 03/08/2016  Labs for ITP Cardiac and Pulmonary Rehab  Hemoglobin A1c 4.0 - 6.0 % 7.0      Pulmonary Assessment Scores:  Pulmonary Assessment Scores     Row Name 04/27/22 1001         ADL UCSD   ADL Phase Entry     SOB Score total 72     Rest 2     Walk 3     Stairs 5     Bath 1     Dress 2     Shop 3       CAT Score   CAT Score 23       mMRC Score   mMRC Score 2              UCSD: Self-administered rating of dyspnea associated with activities of daily living  (ADLs) 6-point scale (0 = "not at all" to 5 = "maximal or unable to do because of breathlessness")  Scoring Scores range from 0 to 120.  Minimally important difference is 5 units  CAT: CAT can identify the health impairment of COPD patients and is better correlated with disease progression.  CAT has a scoring range of zero to 40. The CAT score is classified into four groups of low (less than 10), medium (  10 - 20), high (21-30) and very high (31-40) based on the impact level of disease on health status. A CAT score over 10 suggests significant symptoms.  A worsening CAT score could be explained by an exacerbation, poor medication adherence, poor inhaler technique, or progression of COPD or comorbid conditions.  CAT MCID is 2 points  mMRC: mMRC (Modified Medical Research Council) Dyspnea Scale is used to assess the degree of baseline functional disability in patients of respiratory disease due to dyspnea. No minimal important difference is established. A decrease in score of 1 point or greater is considered a positive change.   Pulmonary Function Assessment:  Pulmonary Function Assessment - 04/17/22 1001       Breath   Shortness of Breath Yes;Limiting activity             Exercise Target Goals: Exercise Program Goal: Individual exercise prescription set using results from initial 6 min walk test and THRR while considering  patient's activity barriers and safety.   Exercise Prescription Goal: Initial exercise prescription builds to 30-45 minutes a day of aerobic activity, 2-3 days per week.  Home exercise guidelines will be given to patient during program as part of exercise prescription that the participant will acknowledge.  Education: Aerobic Exercise: - Group verbal and visual presentation on the components of exercise prescription. Introduces F.I.T.T principle from ACSM for exercise prescriptions.  Reviews F.I.T.T. principles of aerobic exercise including progression. Written  material given at graduation.   Education: Resistance Exercise: - Group verbal and visual presentation on the components of exercise prescription. Introduces F.I.T.T principle from ACSM for exercise prescriptions  Reviews F.I.T.T. principles of resistance exercise including progression. Written material given at graduation.    Education: Exercise & Equipment Safety: - Individual verbal instruction and demonstration of equipment use and safety with use of the equipment. Flowsheet Row Pulmonary Rehab from 04/17/2022 in [facility] Cardiac and Pulmonary Rehab  Date 04/17/22  Educator [facility]  Instruction Review Code 1- Verbalizes Understanding       Education: Exercise Physiology & General Exercise Guidelines: - Group verbal and written instruction with models to review the exercise physiology of the cardiovascular system and associated critical values. Provides general exercise guidelines with specific guidelines to those with heart or lung disease.    Education: Flexibility, Balance, Mind/Body Relaxation: - Group verbal and visual presentation with interactive activity on the components of exercise prescription. Introduces F.I.T.T principle from ACSM for exercise prescriptions. Reviews F.I.T.T. principles of flexibility and balance exercise training including progression. Also discusses the mind body connection.  Reviews various relaxation techniques to help reduce and manage stress (i.e. Deep breathing, progressive muscle relaxation, and visualization). Balance handout provided to take home. Written material given at graduation.   Activity Barriers & Risk Stratification:  Activity Barriers & Cardiac Risk Stratification - 04/27/22 1108       Activity Barriers & Cardiac Risk Stratification   Activity Barriers Other (comment);Deconditioning;Muscular Weakness    Comments Limited ROM above head             6 Minute Walk:  6 Minute Walk     Row Name 04/27/22 1010         6 Minute Walk    Phase Initial     Distance 1260 feet     Walk Time 6 minutes     MPH 2.39     METS 2.37     RPE 12     Perceived Dyspnea  2     VO2 Peak 8.31  Symptoms Yes (comment)  SOB     Comments SOB     Resting HR 76 bpm     Resting BP 124/72     Resting Oxygen Saturation  94 %     Exercise Oxygen Saturation  during 6 min walk 89 %     Max Ex. HR 100 bpm     Max Ex. BP 118/68     2 Minute Post BP 112/70       Interval HR   1 Minute HR 85     2 Minute HR 94     3 Minute HR 94     4 Minute HR 98     5 Minute HR 97     6 Minute HR 100     2 Minute Post HR 77     Interval Heart Rate? Yes       Interval Oxygen   Interval Oxygen? Yes     Baseline Oxygen Saturation % 94 %     1 Minute Oxygen Saturation % 92 %     1 Minute Liters of Oxygen 0 L  RA     2 Minute Oxygen Saturation % 91 %     2 Minute Liters of Oxygen 0 L     3 Minute Oxygen Saturation % 89 %     3 Minute Liters of Oxygen 0 L     4 Minute Oxygen Saturation % 89 %     4 Minute Liters of Oxygen 0 L     5 Minute Oxygen Saturation % 90 %     5 Minute Liters of Oxygen 0 L     6 Minute Oxygen Saturation % 90 %     6 Minute Liters of Oxygen 0 L     2 Minute Post Oxygen Saturation % 96 %     2 Minute Post Liters of Oxygen 0 L             Oxygen Initial Assessment:  Oxygen Initial Assessment - 04/27/22 1110       Home Oxygen   Home Oxygen Device None    Sleep Oxygen Prescription None    Home Exercise Oxygen Prescription None    Home Resting Oxygen Prescription None    Compliance with Home Oxygen Use Yes      Initial 6 min Walk   Oxygen Used None      Program Oxygen Prescription   Program Oxygen Prescription None      Intervention   Short Term Goals To learn and understand importance of monitoring SPO2 with pulse oximeter and demonstrate accurate use of the pulse oximeter.;To learn and understand importance of maintaining oxygen saturations>88%;To learn and demonstrate proper use of respiratory medications;To  learn and demonstrate proper pursed lip breathing techniques or other breathing techniques.     Long  Term Goals Verbalizes importance of monitoring SPO2 with pulse oximeter and return demonstration;Maintenance of O2 saturations>88%;Exhibits proper breathing techniques, such as pursed lip breathing or other method taught during program session;Compliance with respiratory medication;Demonstrates proper use of MDI's             Oxygen Re-Evaluation:  Oxygen Re-Evaluation     Row Name 05/09/22 0756             Program Oxygen Prescription   Program Oxygen Prescription None         Home Oxygen   Home Oxygen Device None       Sleep Oxygen Prescription None  Home Exercise Oxygen Prescription None       Home Resting Oxygen Prescription None       Compliance with Home Oxygen Use Yes         Goals/Expected Outcomes   Short Term Goals To learn and understand importance of monitoring SPO2 with pulse oximeter and demonstrate accurate use of the pulse oximeter.;To learn and understand importance of maintaining oxygen saturations>88%;To learn and demonstrate proper pursed lip breathing techniques or other breathing techniques.        Long  Term Goals Verbalizes importance of monitoring SPO2 with pulse oximeter and return demonstration;Maintenance of O2 saturations>88%;Exhibits proper breathing techniques, such as pursed lip breathing or other method taught during program session;Compliance with respiratory medication       Comments Reviewed PLB technique with pt.  Talked about how it works and it's importance in maintaining their exercise saturations.       Goals/Expected Outcomes Short: Become more profiecient at using PLB.   Long: Become independent at using PLB.                Oxygen Discharge (Final Oxygen Re-Evaluation):  Oxygen Re-Evaluation - 05/09/22 0756       Program Oxygen Prescription   Program Oxygen Prescription None      Home Oxygen   Home Oxygen Device None     Sleep Oxygen Prescription None    Home Exercise Oxygen Prescription None    Home Resting Oxygen Prescription None    Compliance with Home Oxygen Use Yes      Goals/Expected Outcomes   Short Term Goals To learn and understand importance of monitoring SPO2 with pulse oximeter and demonstrate accurate use of the pulse oximeter.;To learn and understand importance of maintaining oxygen saturations>88%;To learn and demonstrate proper pursed lip breathing techniques or other breathing techniques.     Long  Term Goals Verbalizes importance of monitoring SPO2 with pulse oximeter and return demonstration;Maintenance of O2 saturations>88%;Exhibits proper breathing techniques, such as pursed lip breathing or other method taught during program session;Compliance with respiratory medication    Comments Reviewed PLB technique with pt.  Talked about how it works and it's importance in maintaining their exercise saturations.    Goals/Expected Outcomes Short: Become more profiecient at using PLB.   Long: Become independent at using PLB.             Initial Exercise Prescription:  Initial Exercise Prescription - 04/27/22 1100       Date of Initial Exercise RX and Referring Provider   Date 04/27/22    Referring Provider Ottie Glazier MD      Oxygen   Maintain Oxygen Saturation 88% or higher      Treadmill   MPH 2.2    Grade 0    Minutes 15    METs 2.68      NuStep   Level 2    SPM 80    Minutes 15    METs 2.3      REL-XR   Level 1    Speed 50    Minutes 15    METs 2.3      Prescription Details   Frequency (times per week) 3    Duration Progress to 30 minutes of continuous aerobic without signs/symptoms of physical distress      Intensity   THRR 40-80% of Max Heartrate 107 - 139    Ratings of Perceived Exertion 11-13    Perceived Dyspnea 0-4      Progression  Progression Continue to progress workloads to maintain intensity without signs/symptoms of physical distress.       Resistance Training   Training Prescription Yes    Weight 3 lb    Reps 10-15             Perform Capillary Blood Glucose checks as needed.  Exercise Prescription Changes:   Exercise Prescription Changes     Row Name 04/27/22 1100             Response to Exercise   Blood Pressure (Admit) 124/72       Blood Pressure (Exercise) 118/68       Blood Pressure (Exit) 112/70       Heart Rate (Admit) 76 bpm       Heart Rate (Exercise) 100 bpm       Heart Rate (Exit) 77 bpm       Oxygen Saturation (Admit) 94 %       Oxygen Saturation (Exercise) 89 %       Oxygen Saturation (Exit) 96 %       Rating of Perceived Exertion (Exercise) 12       Perceived Dyspnea (Exercise) 2       Symptoms SOB       Comments walk test results                Exercise Comments:   Exercise Comments     Row Name 05/09/22 0756           Exercise Comments First full day of exercise!  Patient was oriented to gym and equipment including functions, settings, policies, and procedures.  Patient's individual exercise prescription and treatment plan were reviewed.  All starting workloads were established based on the results of the 6 minute walk test done at initial orientation visit.  The plan for exercise progression was also introduced and progression will be customized based on patient's performance and goals.                Exercise Goals and Review:   Exercise Goals     Row Name 04/27/22 1126             Exercise Goals   Increase Physical Activity Yes       Intervention Provide advice, education, support and counseling about physical activity/exercise needs.;Develop an individualized exercise prescription for aerobic and resistive training based on initial evaluation findings, risk stratification, comorbidities and participant's personal goals.       Expected Outcomes Short Term: Attend rehab on a regular basis to increase amount of physical activity.;Long Term: Add in home exercise  to make exercise part of routine and to increase amount of physical activity.;Long Term: Exercising regularly at least 3-5 days a week.       Increase Strength and Stamina Yes       Intervention Provide advice, education, support and counseling about physical activity/exercise needs.;Develop an individualized exercise prescription for aerobic and resistive training based on initial evaluation findings, risk stratification, comorbidities and participant's personal goals.       Expected Outcomes Short Term: Increase workloads from initial exercise prescription for resistance, speed, and METs.;Short Term: Perform resistance training exercises routinely during rehab and add in resistance training at home;Long Term: Improve cardiorespiratory fitness, muscular endurance and strength as measured by increased METs and functional capacity (6MWT)       Able to understand and use rate of perceived exertion (RPE) scale Yes       Intervention Provide education and  explanation on how to use RPE scale       Expected Outcomes Short Term: Able to use RPE daily in rehab to express subjective intensity level;Long Term:  Able to use RPE to guide intensity level when exercising independently       Able to understand and use Dyspnea scale Yes       Intervention Provide education and explanation on how to use Dyspnea scale       Expected Outcomes Short Term: Able to use Dyspnea scale daily in rehab to express subjective sense of shortness of breath during exertion;Long Term: Able to use Dyspnea scale to guide intensity level when exercising independently       Knowledge and understanding of Target Heart Rate Range (THRR) Yes       Intervention Provide education and explanation of THRR including how the numbers were predicted and where they are located for reference       Expected Outcomes Short Term: Able to state/look up THRR;Long Term: Able to use THRR to govern intensity when exercising independently;Short Term: Able to use  daily as guideline for intensity in rehab       Able to check pulse independently Yes       Intervention Provide education and demonstration on how to check pulse in carotid and radial arteries.;Review the importance of being able to check your own pulse for safety during independent exercise       Expected Outcomes Short Term: Able to explain why pulse checking is important during independent exercise;Long Term: Able to check pulse independently and accurately       Understanding of Exercise Prescription Yes       Intervention Provide education, explanation, and written materials on patient's individual exercise prescription       Expected Outcomes Short Term: Able to explain program exercise prescription;Long Term: Able to explain home exercise prescription to exercise independently                Exercise Goals Re-Evaluation :  Exercise Goals Re-Evaluation     Row Name 05/09/22 0756             Exercise Goal Re-Evaluation   Exercise Goals Review Increase Physical Activity;Able to understand and use rate of perceived exertion (RPE) scale;Knowledge and understanding of Target Heart Rate Range (THRR);Understanding of Exercise Prescription;Increase Strength and Stamina;Able to understand and use Dyspnea scale;Able to check pulse independently       Comments Reviewed RPE and dyspnea scales, THR and program prescription with pt today.  Pt voiced understanding and was given a copy of goals to take home.       Expected Outcomes Short: Use RPE daily to regulate intensity. Long: Follow program prescription in THR.                Discharge Exercise Prescription (Final Exercise Prescription Changes):  Exercise Prescription Changes - 04/27/22 1100       Response to Exercise   Blood Pressure (Admit) 124/72    Blood Pressure (Exercise) 118/68    Blood Pressure (Exit) 112/70    Heart Rate (Admit) 76 bpm    Heart Rate (Exercise) 100 bpm    Heart Rate (Exit) 77 bpm    Oxygen Saturation  (Admit) 94 %    Oxygen Saturation (Exercise) 89 %    Oxygen Saturation (Exit) 96 %    Rating of Perceived Exertion (Exercise) 12    Perceived Dyspnea (Exercise) 2    Symptoms SOB    Comments  walk test results             Nutrition:  Target Goals: Understanding of nutrition guidelines, daily intake of sodium '1500mg'$ , cholesterol '200mg'$ , calories 30% from fat and 7% or less from saturated fats, daily to have 5 or more servings of fruits and vegetables.  Education: All About Nutrition: -Group instruction provided by verbal, written material, interactive activities, discussions, models, and posters to present general guidelines for heart healthy nutrition including fat, fiber, MyPlate, the role of sodium in heart healthy nutrition, utilization of the nutrition label, and utilization of this knowledge for meal planning. Follow up email sent as well. Written material given at graduation.   Biometrics:  Pre Biometrics - 04/27/22 1108       Pre Biometrics   Height 5' 0.2" (1.529 m)    Weight 195 lb 14.4 oz (88.9 kg)    BMI (Calculated) 38.01    Single Leg Stand 13.3 seconds              Nutrition Therapy Plan and Nutrition Goals:  Nutrition Therapy & Goals - 04/27/22 1110       Intervention Plan   Intervention Prescribe, educate and counsel regarding individualized specific dietary modifications aiming towards targeted core components such as weight, hypertension, lipid management, diabetes, heart failure and other comorbidities.    Expected Outcomes Short Term Goal: Understand basic principles of dietary content, such as calories, fat, sodium, cholesterol and nutrients.;Short Term Goal: A plan has been developed with personal nutrition goals set during dietitian appointment.;Long Term Goal: Adherence to prescribed nutrition plan.             Nutrition Assessments:  MEDIFICTS Score Key: ?70 Need to make dietary changes  40-70 Heart Healthy Diet ? 40 Therapeutic Level  Cholesterol Diet  Flowsheet Row Pulmonary Rehab from 04/27/2022 in [facility] Cardiac and Pulmonary Rehab  Picture Your Plate Total Score on Admission 51      Picture Your Plate Scores: <54 Unhealthy dietary pattern with much room for improvement. 41-50 Dietary pattern unlikely to meet recommendations for good health and room for improvement. 51-60 More healthful dietary pattern, with some room for improvement.  >60 Healthy dietary pattern, although there may be some specific behaviors that could be improved.   Nutrition Goals Re-Evaluation:   Nutrition Goals Discharge (Final Nutrition Goals Re-Evaluation):   Psychosocial: Target Goals: Acknowledge presence or absence of significant depression and/or stress, maximize coping skills, provide positive support system. Participant is able to verbalize types and ability to use techniques and skills needed for reducing stress and depression.   Education: Stress, Anxiety, and Depression - Group verbal and visual presentation to define topics covered.  Reviews how body is impacted by stress, anxiety, and depression.  Also discusses healthy ways to reduce stress and to treat/manage anxiety and depression.  Written material given at graduation.   Education: Sleep Hygiene -Provides group verbal and written instruction about how sleep can affect your health.  Define sleep hygiene, discuss sleep cycles and impact of sleep habits. Review good sleep hygiene tips.    Initial Review & Psychosocial Screening:  Initial Psych Review & Screening - 04/17/22 1003       Initial Review   Current issues with History of Depression      Family Dynamics   Good Support System? Yes    Comments Fatime can look to her son and her sister for support. Other than these two people she does not have any other sources of support. She walks  with her dog but is not able to go very far and is ready to improve her shortness of breath.      Barriers   Psychosocial barriers to  participate in program The patient should benefit from training in stress management and relaxation.      Screening Interventions   Interventions Provide feedback about the scores to participant;Encouraged to exercise;To provide support and resources with identified psychosocial needs    Expected Outcomes Short Term goal: Utilizing psychosocial counselor, staff and physician to assist with identification of specific Stressors or current issues interfering with healing process. Setting desired goal for each stressor or current issue identified.;Long Term Goal: Stressors or current issues are controlled or eliminated.;Short Term goal: Identification and review with participant of any Quality of Life or Depression concerns found by scoring the questionnaire.;Long Term goal: The participant improves quality of Life and PHQ9 Scores as seen by post scores and/or verbalization of changes             Quality of Life Scores:  Scores of 19 and below usually indicate a poorer quality of life in these areas.  A difference of  2-3 points is a clinically meaningful difference.  A difference of 2-3 points in the total score of the Quality of Life Index has been associated with significant improvement in overall quality of life, self-image, physical symptoms, and general health in studies assessing change in quality of life.  PHQ-9: Review Flowsheet       04/27/2022  Depression screen PHQ 2/9  Decreased Interest 1  Down, Depressed, Hopeless 1  PHQ - 2 Score 2  Altered sleeping 2  Tired, decreased energy 2  Change in appetite 1  Feeling bad or failure about yourself  1  Trouble concentrating 0  Moving slowly or fidgety/restless 0  Suicidal thoughts 0  PHQ-9 Score 8  Difficult doing work/chores Very difficult   Interpretation of Total Score  Total Score Depression Severity:  1-4 = Minimal depression, 5-9 = Mild depression, 10-14 = Moderate depression, 15-19 = Moderately severe depression, 20-27 =  Severe depression   Psychosocial Evaluation and Intervention:  Psychosocial Evaluation - 04/17/22 1005       Psychosocial Evaluation & Interventions   Interventions Encouraged to exercise with the program and follow exercise prescription;Relaxation education;Stress management education    Comments Johara can look to her son and her sister for support. Other than these two people she does not have any other sources of support. She walks with her dog but is not able to go very far and is ready to improve her shortness of breath.    Expected Outcomes Short: Start [facility] to help with mood. Long: Maintain a healthy mental state.    Continue Psychosocial Services  Follow up required by staff             Psychosocial Re-Evaluation:   Psychosocial Discharge (Final Psychosocial Re-Evaluation):   Education: Education Goals: Education classes will be provided on a weekly basis, covering required topics. Participant will state understanding/return demonstration of topics presented.  Learning Barriers/Preferences:  Learning Barriers/Preferences - 04/17/22 1001       Learning Barriers/Preferences   Learning Barriers None    Learning Preferences None             General Pulmonary Education Topics:  Infection Prevention: - Provides verbal and written material to individual with discussion of infection control including proper hand washing and proper equipment cleaning during exercise session. Flowsheet Row Pulmonary Rehab from 04/17/2022  in [facility] Cardiac and Pulmonary Rehab  Date 04/17/22  Educator [facility]  Instruction Review Code 1- Verbalizes Understanding       Falls Prevention: - Provides verbal and written material to individual with discussion of falls prevention and safety. Flowsheet Row Pulmonary Rehab from 04/17/2022 in [facility] Cardiac and Pulmonary Rehab  Date 04/17/22  Educator [facility]  Instruction Review Code 1- Verbalizes Understanding       Chronic Lung Disease  Review: - Group verbal instruction with posters, models, PowerPoint presentations and videos,  to review new updates, new respiratory medications, new advancements in procedures and treatments. Providing information on websites and "800" numbers for continued self-education. Includes information about supplement oxygen, available portable oxygen systems, continuous and intermittent flow rates, oxygen safety, concentrators, and Medicare reimbursement for oxygen. Explanation of Pulmonary Drugs, including class, frequency, complications, importance of spacers, rinsing mouth after steroid MDI's, and proper cleaning methods for nebulizers. Review of basic lung anatomy and physiology related to function, structure, and complications of lung disease. Review of risk factors. Discussion about methods for diagnosing sleep apnea and types of masks and machines for OSA. Includes a review of the use of types of environmental controls: home humidity, furnaces, filters, dust mite/pet prevention, HEPA vacuums. Discussion about weather changes, air quality and the benefits of nasal washing. Instruction on Warning signs, infection symptoms, calling MD promptly, preventive modes, and value of vaccinations. Review of effective airway clearance, coughing and/or vibration techniques. Emphasizing that all should Create an Action Plan. Written material given at graduation.   AED/CPR: - Group verbal and written instruction with the use of models to demonstrate the basic use of the AED with the basic ABC's of resuscitation.    Anatomy and Cardiac Procedures: - Group verbal and visual presentation and models provide information about basic cardiac anatomy and function. Reviews the testing methods done to diagnose heart disease and the outcomes of the test results. Describes the treatment choices: Medical Management, Angioplasty, or Coronary Bypass Surgery for treating various heart conditions including Myocardial Infarction, Angina,  Valve Disease, and Cardiac Arrhythmias.  Written material given at graduation.   Medication Safety: - Group verbal and visual instruction to review commonly prescribed medications for heart and lung disease. Reviews the medication, class of the drug, and side effects. Includes the steps to properly store meds and maintain the prescription regimen.  Written material given at graduation.   Other: -Provides group and verbal instruction on various topics (see comments)   Knowledge Questionnaire Score:  Knowledge Questionnaire Score - 04/27/22 0951       Knowledge Questionnaire Score   Pre Score 17/18              Core Components/Risk Factors/Patient Goals at Admission:  Personal Goals and Risk Factors at Admission - 04/27/22 1127       Core Components/Risk Factors/Patient Goals on Admission    Weight Management Yes;Weight Loss;Obesity    Intervention Weight Management: Develop a combined nutrition and exercise program designed to reach desired caloric intake, while maintaining appropriate intake of nutrient and fiber, sodium and fats, and appropriate energy expenditure required for the weight goal.;Weight Management: Provide education and appropriate resources to help participant work on and attain dietary goals.;Weight Management/Obesity: Establish reasonable short term and long term weight goals.    Admit Weight 195 lb (88.5 kg)    Goal Weight: Short Term 190 lb (86.2 kg)    Goal Weight: Long Term 170 lb (77.1 kg)    Expected Outcomes Short Term: Continue to  assess and modify interventions until short term weight is achieved;Long Term: Adherence to nutrition and physical activity/exercise program aimed toward attainment of established weight goal;Weight Loss: Understanding of general recommendations for a balanced deficit meal plan, which promotes 1-2 lb weight loss per week and includes a negative energy balance of [phone] kcal/d;Understanding recommendations for meals to include  15-35% energy as protein, 25-35% energy from fat, 35-60% energy from carbohydrates, less than '200mg'$  of dietary cholesterol, 20-35 gm of total fiber daily;Understanding of distribution of calorie intake throughout the day with the consumption of 4-5 meals/snacks    Improve shortness of breath with ADL's Yes    Intervention Provide education, individualized exercise plan and daily activity instruction to help decrease symptoms of SOB with activities of daily living.    Expected Outcomes Short Term: Improve cardiorespiratory fitness to achieve a reduction of symptoms when performing ADLs;Long Term: Be able to perform more ADLs without symptoms or delay the onset of symptoms    Diabetes Yes    Intervention Provide education about signs/symptoms and action to take for hypo/hyperglycemia.;Provide education about proper nutrition, including hydration, and aerobic/resistive exercise prescription along with prescribed medications to achieve blood glucose in normal ranges: Fasting glucose 65-99 mg/dL    Expected Outcomes Short Term: Participant verbalizes understanding of the signs/symptoms and immediate care of hyper/hypoglycemia, proper foot care and importance of medication, aerobic/resistive exercise and nutrition plan for blood glucose control.;Long Term: Attainment of HbA1C < 7%.    Hypertension Yes    Intervention Provide education on lifestyle modifcations including regular physical activity/exercise, weight management, moderate sodium restriction and increased consumption of fresh fruit, vegetables, and low fat dairy, alcohol moderation, and smoking cessation.;Monitor prescription use compliance.    Expected Outcomes Short Term: Continued assessment and intervention until BP is < 140/84m HG in hypertensive participants. < 130/878mHG in hypertensive participants with diabetes, heart failure or chronic kidney disease.;Long Term: Maintenance of blood pressure at goal levels.    Lipids Yes    Intervention  Provide education and support for participant on nutrition & aerobic/resistive exercise along with prescribed medications to achieve LDL '70mg'$ , HDL >'40mg'$ .    Expected Outcomes Short Term: Participant states understanding of desired cholesterol values and is compliant with medications prescribed. Participant is following exercise prescription and nutrition guidelines.;Long Term: Cholesterol controlled with medications as prescribed, with individualized exercise RX and with personalized nutrition plan. Value goals: LDL < '70mg'$ , HDL > 40 mg.             Education:Diabetes - Individual verbal and written instruction to review signs/symptoms of diabetes, desired ranges of glucose level fasting, after meals and with exercise. Acknowledge that pre and post exercise glucose checks will be done for 3 sessions at entry of program. [facility]  Date 04/17/22  [facility]- Verbalizes Understanding       Know Your Numbers and Heart Failure: - Group verbal and visual instruction to discuss disease risk factors for cardiac and pulmonary disease and treatment options.  Reviews associated critical values for Overweight/Obesity, Hypertension, Cholesterol, and Diabetes.  Discusses basics of heart failure: signs/symptoms and treatments.  Introduces Heart Failure Zone chart for action plan for heart failure.  Written material given at graduation.   Core Components/Risk Factors/Patient Goals Review:    Core Components/Risk Factors/Patient Goals at Discharge (Final Review):    ITP Comments:  ITP Comments     Row Name 04/17/22 0959 04/27/22 1105  05/09/22 0756 05/10/22 0947     ITP Comments Virtual Visit completed. Patient informed on EP and RD appointment and 6 Minute walk test. Patient also informed of patient health questionnaires on My Chart. Patient Verbalizes understanding. Visit diagnosis can be found in  [facility] 01/13/2022. Completed 6MWT and gym orientation. Initial ITP created and sent for review to Dr. Ottie Glazier, Medical Director. First full day of exercise!  Patient was oriented to gym and equipment including functions, settings, policies, and procedures.  Patient's individual exercise prescription and treatment plan were reviewed.  All starting workloads were established based on the results of the 6 minute walk test done at initial orientation visit.  The plan for exercise progression was also introduced and progression will be customized based on patient's performance and goals. 30 Day review completed. Medical Director ITP review done, changes made as directed, and signed approval by Medical Director.   NEW             Comments:

## 2022-05-11 DIAGNOSIS — J449 Chronic obstructive pulmonary disease, unspecified: Secondary | ICD-10-CM | POA: Diagnosis not present

## 2022-05-11 LAB — GLUCOSE, CAPILLARY
Glucose-Capillary: 191 mg/dL — ABNORMAL HIGH (ref 70–99)
Glucose-Capillary: 133 mg/dL — ABNORMAL HIGH (ref 70–99)

## 2022-05-11 NOTE — Progress Notes (Signed)
Daily Session Note  Patient Details  Name: Autumn Ramos MRN: [id number] Date of Birth: [birth year] Referring Provider:   Flowsheet Row Pulmonary Rehab from 04/27/2022 in [facility] Cardiac and Pulmonary Rehab  Referring Provider Ottie Glazier MD       Encounter Date: 05/11/2022  Check In:  Session Check In - 05/11/22 0757       Check-In   Supervising physician immediately available to respond to emergencies See telemetry face sheet for immediately available ER MD    Location [facility]-Cardiac & Pulmonary Rehab    Staff Present Justin Mend, RCP,RRT,BSRT;Atha Muradyan, MPA, RN;Melissa [place], RDN, LDN;Jessica [place]arton, MA, RCEP, CCRP, CCET    Virtual Visit No    Medication changes reported     No    Fall or balance concerns reported    No    Tobacco Cessation No Change    Warm-up and Cool-down Performed on first and last piece of equipment    Resistance Training Performed Yes    VAD Patient? No    PAD/SET Patient? No      Pain Assessment   Currently in Pain? No/denies                Social History   Tobacco Use  Smoking Status Former   Packs/day: 0.25   Years: 25.00   Total pack years: 6.25   Types: Cigarettes   Quit date: 06/14/2014   Years since quitting: 7.9  Smokeless Tobacco Never    Goals Met:  Independence with exercise equipment Exercise tolerated well No report of concerns or symptoms today Strength training completed today  Goals Unmet:  Not Applicable  Comments: Pt able to follow exercise prescription today without complaint.  Will continue to monitor for progression.    Dr. Emily Filbert is Medical Director for [place].  Dr. Ottie Glazier is Medical Director for [facility] Pulmonary Rehabilitation.

## 2022-05-12 ENCOUNTER — Encounter: Payer: Medicare Other | Admitting: *Deleted

## 2022-05-12 DIAGNOSIS — J449 Chronic obstructive pulmonary disease, unspecified: Secondary | ICD-10-CM

## 2022-05-12 LAB — GLUCOSE, CAPILLARY
Glucose-Capillary: 215 mg/dL — ABNORMAL HIGH (ref 70–99)
Glucose-Capillary: 158 mg/dL — ABNORMAL HIGH (ref 70–99)

## 2022-05-12 NOTE — Progress Notes (Signed)
Daily Session Note  Patient Details  Name: Autumn Ramos MRN: [id number] Date of Birth: [birth year] Referring Provider:   Flowsheet Row Pulmonary Rehab from 04/27/2022 in [facility] Cardiac and Pulmonary Rehab  Referring Provider Ottie Glazier MD       Encounter Date: 05/12/2022  Check In:  Session Check In - 05/12/22 0826       Check-In   Supervising physician immediately available to respond to emergencies See telemetry face sheet for immediately available ER MD    Location [facility]-Cardiac & Pulmonary Rehab    Staff Present Alberteen Sam, MA, RCEP, CCRP, CCET;Dayona Shaheen, RN, BSN, CCRP;Other   Seward Carol MS, ACSM CEP, Exercise Physiologist   Virtual Visit No    Medication changes reported     No    Fall or balance concerns reported    No    Warm-up and Cool-down Performed on first and last piece of equipment    Resistance Training Performed Yes    VAD Patient? No    PAD/SET Patient? No      Pain Assessment   Currently in Pain? No/denies                Social History   Tobacco Use  Smoking Status Former   Packs/day: 0.25   Years: 25.00   Total pack years: 6.25   Types: Cigarettes   Quit date: 06/14/2014   Years since quitting: 7.9  Smokeless Tobacco Never    Goals Met:  Proper associated with RPD/PD & O2 Sat Independence with exercise equipment Exercise tolerated well No report of concerns or symptoms today  Goals Unmet:  Not Applicable  Comments: Pt able to follow exercise prescription today without complaint.  Will continue to monitor for progression.    Dr. Emily Filbert is Medical Director for [place].  Dr. Ottie Glazier is Medical Director for [facility] Pulmonary Rehabilitation.

## 2022-05-16 DIAGNOSIS — J449 Chronic obstructive pulmonary disease, unspecified: Secondary | ICD-10-CM | POA: Diagnosis not present

## 2022-05-16 NOTE — Progress Notes (Signed)
Daily Session Note  Patient Details  Name: Autumn Ramos MRN: [id number] Date of Birth: 05/05/1956 Referring Provider:   Flowsheet Row Pulmonary Rehab from 04/27/2022 in [facility] Cardiac and Pulmonary Rehab  Referring Provider Ottie Glazier MD       Encounter Date: 05/16/2022  Check In:  Session Check In - 05/16/22 0758       Check-In   Supervising physician immediately available to respond to emergencies See telemetry face sheet for immediately available ER MD    Location [facility]-Cardiac & Pulmonary Rehab    Staff Present Will Bonnet, RN,BC,MSN;Jessica [place], MA, RCEP, CCRP, [place], BS, ACSM CEP, Exercise Physiologist    Virtual Visit No    Medication changes reported     No    Fall or balance concerns reported    No    Tobacco Cessation No Change    Warm-up and Cool-down Performed on first and last piece of equipment    Resistance Training Performed Yes    VAD Patient? No    PAD/SET Patient? No      Pain Assessment   Currently in Pain? No/denies    Multiple Pain Sites No                Social History   Tobacco Use  Smoking Status Former   Packs/day: 0.25   Years: 25.00   Total pack years: 6.25   Types: Cigarettes   Quit date: 06/14/2014   Years since quitting: 7.9  Smokeless Tobacco Never    Goals Met:  Independence with exercise equipment Exercise tolerated well Personal goals reviewed No report of concerns or symptoms today  Goals Unmet:  Not Applicable  Comments: Pt able to follow exercise prescription today without complaint.  Will continue to monitor for progression.    Dr. Emily Filbert is Medical Director for [place].  Dr. Ottie Glazier is Medical Director for [facility] Pulmonary Rehabilitation.

## 2022-05-23 DIAGNOSIS — J449 Chronic obstructive pulmonary disease, unspecified: Secondary | ICD-10-CM

## 2022-05-23 NOTE — Progress Notes (Signed)
Daily Session Note  Patient Details  Name: Autumn Ramos MRN: [id number] Date of Birth: [birth year] Referring Provider:   Flowsheet Row Pulmonary Rehab from 04/27/2022 in [facility] Cardiac and Pulmonary Rehab  Referring Provider Ottie Glazier MD       Encounter Date: 05/23/2022  Check In:  Session Check In - 05/23/22 0804       Check-In   Supervising physician immediately available to respond to emergencies See telemetry face sheet for immediately available ER MD    Location [facility]-Cardiac & Pulmonary Rehab    Staff Present Earlean Shawl, BS, ACSM CEP, Exercise Physiologist;Jamarius Saha, RN,BC,MSN;Jessica [place], MA, RCEP, CCRP, CCET    Virtual Visit No    Medication changes reported     No    Fall or balance concerns reported    No    Tobacco Cessation No Change    Warm-up and Cool-down Performed on first and last piece of equipment    Resistance Training Performed Yes    VAD Patient? No    PAD/SET Patient? No      Pain Assessment   Currently in Pain? No/denies    Multiple Pain Sites No                Social History   Tobacco Use  Smoking Status Former   Packs/day: 0.25   Years: 25.00   Total pack years: 6.25   Types: Cigarettes   Quit date: 06/14/2014   Years since quitting: 7.9  Smokeless Tobacco Never    Goals Met:  Independence with exercise equipment Exercise tolerated well No report of concerns or symptoms today Strength training completed today  Goals Unmet:  Not Applicable  Comments: Pt able to follow exercise prescription today without complaint.  Will continue to monitor for progression.    Dr. Emily Filbert is Medical Director for [place].  Dr. Ottie Glazier is Medical Director for [facility] Pulmonary Rehabilitation.

## 2022-05-25 ENCOUNTER — Encounter: Payer: Medicare Other | Admitting: *Deleted

## 2022-05-25 DIAGNOSIS — J449 Chronic obstructive pulmonary disease, unspecified: Secondary | ICD-10-CM | POA: Diagnosis not present

## 2022-05-25 NOTE — Progress Notes (Signed)
Daily Session Note  Patient Details  Name: Autumn Ramos MRN: [id number] Date of Birth: 03-Mar-1956 Referring Provider:   Flowsheet Row Pulmonary Rehab from 04/27/2022 in [facility] Cardiac and Pulmonary Rehab  Referring Provider Ottie Glazier MD       Encounter Date: 05/25/2022  Check In:  Session Check In - 05/25/22 0759       Check-In   Supervising physician immediately available to respond to emergencies See telemetry face sheet for immediately available ER MD    Location [facility]-Cardiac & Pulmonary Rehab    Staff Present Heath Lark, RN, BSN, CCRP;Jessica Wil[place], MA, RCEP, CCRP, Marylynn Pearson, MS, ASCM CEP, Exercise Physiologist    Virtual Visit No    Medication changes reported     No    Fall or balance concerns reported    No    Warm-up and Cool-down Performed on first and last piece of equipment    Resistance Training Performed Yes    VAD Patient? No    PAD/SET Patient? No      Pain Assessment   Currently in Pain? No/denies                Social History   Tobacco Use  Smoking Status Former   Packs/day: 0.25   Years: 25.00   Total pack years: 6.25   Types: Cigarettes   Quit date: 06/14/2014   Years since quitting: 7.9  Smokeless Tobacco Never    Goals Met:  Proper associated with RPD/PD & O2 Sat Independence with exercise equipment Exercise tolerated well No report of concerns or symptoms today  Goals Unmet:  Not Applicable  Comments: Pt able to follow exercise prescription today without complaint.  Will continue to monitor for progression.    Dr. Emily Filbert is Medical Director for [place].  Dr. Ottie Glazier is Medical Director for [facility] Pulmonary Rehabilitation.

## 2022-05-30 DIAGNOSIS — J449 Chronic obstructive pulmonary disease, unspecified: Secondary | ICD-10-CM

## 2022-05-30 NOTE — Progress Notes (Signed)
Discharge Progress Report  Patient Details  Name: Autumn Ramos MRN: [id number] Date of Birth: 03-06-1956 Referring Provider:   Flowsheet Row Pulmonary Rehab from 04/27/2022 in [facility]  Referring Provider Ottie Glazier MD        Number of Visits: 7  Reason for Discharge:  Early Exit:  Personal  Smoking History:  Social History   Tobacco Use  Smoking Status Former   Packs/day: 0.25   Years: 25.00   Total pack years: 6.25   Types: Cigarettes   Quit date: 06/14/2014   Years since quitting: 7.9  Smokeless Tobacco Never    Diagnosis:  Chronic obstructive pulmonary disease, unspecified COPD type ([place])  ADL UCSD:  Pulmonary Assessment Scores     Row Name 04/27/22 1001         ADL UCSD   ADL Phase Entry     SOB Score total 72     Rest 2     Walk 3     Stairs 5     Bath 1     Dress 2     Shop 3       CAT Score   CAT Score 23       mMRC Score   mMRC Score 2              Initial Exercise Prescription:  Initial Exercise Prescription - 04/27/22 1100       Date of Initial Exercise RX and Referring Provider   Date 04/27/22    Referring Provider Ottie Glazier MD      Oxygen   Maintain Oxygen Saturation 88% or higher      Treadmill   MPH 2.2    Grade 0    Minutes 15    METs 2.68      NuStep   Level 2    SPM 80    Minutes 15    METs 2.3      REL-XR   Level 1    Speed 50    Minutes 15    METs 2.3      Prescription Details   Frequency (times per week) 3    Duration Progress to 30 minutes of continuous aerobic without signs/symptoms of physical distress      Intensity   THRR 40-80% of Max Heartrate 107 - 139    Ratings of Perceived Exertion 11-13    Perceived Dyspnea 0-4      Progression   Progression Continue to progress workloads to maintain intensity without signs/symptoms of physical distress.      Resistance Training   Training Prescription Yes    Weight 3 lb    Reps 10-15              Discharge Exercise Prescription (Final Exercise Prescription Changes):  Exercise Prescription Changes - 05/15/22 0900       Response to Exercise   Blood Pressure (Admit) 134/70    Blood Pressure (Exercise) 144/72    Blood Pressure (Exit) 126/64    Heart Rate (Admit) 70 bpm    Heart Rate (Exercise) 110 bpm    Heart Rate (Exit) 96 bpm    Oxygen Saturation (Admit) 94 %    Oxygen Saturation (Exercise) 88 %    Oxygen Saturation (Exit) 95 %    Rating of Perceived Exertion (Exercise) 13    Perceived Dyspnea (Exercise) 2    Symptoms SOB    Duration Progress to 30 minutes of  aerobic without signs/symptoms of  physical distress    Intensity THRR unchanged      Progression   Progression Continue to progress workloads to maintain intensity without signs/symptoms of physical distress.    Average METs 2.93      Resistance Training   Training Prescription Yes    Weight 3 lb    Reps 10-15      Treadmill   MPH 2.2    Grade 0.5    Minutes 15    METs 2.84      NuStep   Level 2    Minutes 15    METs 3.2      REL-XR   Level 2    Minutes 15    METs 3.8      Oxygen   Maintain Oxygen Saturation 88% or higher             Functional Capacity:  6 Minute Walk     Row Name 04/27/22 1010         6 Minute Walk   Phase Initial     Distance 1260 feet     Walk Time 6 minutes     MPH 2.39     METS 2.37     RPE 12     Perceived Dyspnea  2     VO2 Peak 8.31     Symptoms Yes (comment)  SOB     Comments SOB     Resting HR 76 bpm     Resting BP 124/72     Resting Oxygen Saturation  94 %     Exercise Oxygen Saturation  during 6 min walk 89 %     Max Ex. HR 100 bpm     Max Ex. BP 118/68     2 Minute Post BP 112/70       Interval HR   1 Minute HR 85     2 Minute HR 94     3 Minute HR 94     4 Minute HR 98     5 Minute HR 97     6 Minute HR 100     2 Minute Post HR 77     Interval Heart Rate? Yes       Interval Oxygen   Interval Oxygen? Yes     Baseline Oxygen  Saturation % 94 %     1 Minute Oxygen Saturation % 92 %     1 Minute Liters of Oxygen 0 L  RA     2 Minute Oxygen Saturation % 91 %     2 Minute Liters of Oxygen 0 L     3 Minute Oxygen Saturation % 89 %     3 Minute Liters of Oxygen 0 L     4 Minute Oxygen Saturation % 89 %     4 Minute Liters of Oxygen 0 L     5 Minute Oxygen Saturation % 90 %     5 Minute Liters of Oxygen 0 L     6 Minute Oxygen Saturation % 90 %     6 Minute Liters of Oxygen 0 L     2 Minute Post Oxygen Saturation % 96 %     2 Minute Post Liters of Oxygen 0 L              Psychological, QOL, Others - Outcomes: PHQ 2/9:    05/23/2022    8:32 AM 04/27/2022    9:52 AM  Depression screen PHQ 2/9  Decreased Interest 2 1  Down, Depressed, Hopeless 2 1  PHQ - 2 Score 4 2  Altered sleeping 0 2  Tired, decreased energy 0 2  Change in appetite 0 1  Feeling bad or failure about yourself  1 1  Trouble concentrating 0 0  Moving slowly or fidgety/restless 0 0  Suicidal thoughts 0 0  PHQ-9 Score 5 8  Difficult doing work/chores Somewhat difficult Very difficult    Quality of Life:   Nutrition & Weight - Outcomes:  Pre Biometrics - 04/27/22 1108       Pre Biometrics   Height 5' 0.2" (1.529 m)    Weight 195 lb 14.4 oz (88.9 kg)    BMI (Calculated) 38.01    Single Leg Stand 13.3 seconds              Nutrition:  Nutrition Therapy & Goals - 04/27/22 1110       Intervention Plan   Intervention Prescribe, educate and counsel regarding individualized specific dietary modifications aiming towards targeted core components such as weight, hypertension, lipid management, diabetes, heart failure and other comorbidities.    Expected Outcomes Short Term Goal: Understand basic principles of dietary content, such as calories, fat, sodium, cholesterol and nutrients.;Short Term Goal: A plan has been developed with personal nutrition goals set during dietitian appointment.;Long Term Goal: Adherence to prescribed  nutrition plan.             Nutrition Discharge:   Education Questionnaire Score:  Knowledge Questionnaire Score - 04/27/22 0951       Knowledge Questionnaire Score   Pre Score 17/18             Goals reviewed with patient; copy given to patient.

## 2022-05-30 NOTE — Progress Notes (Signed)
Pulmonary Individual Treatment Plan  Patient Details  Name: Autumn Ramos MRN: [id number] Date of Birth: [birth year] Referring Provider:   Flowsheet Row Pulmonary Rehab from 04/27/2022 in [facility] Cardiac and Pulmonary Rehab  Referring Provider Ottie Glazier MD       Initial Encounter Date:  Flowsheet Row Pulmonary Rehab from 04/27/2022 in [facility] Cardiac and Pulmonary Rehab  Date 04/27/22       Visit Diagnosis: Chronic obstructive pulmonary disease, unspecified COPD type ([place])  Patient's Home Medications on Admission:  Current Outpatient Medications:    Accu-Chek Softclix Lancets lancets, Accu-Chek Softclix Lancets, Disp: , Rfl:    albuterol (PROVENTIL HFA;VENTOLIN HFA) 108 (90 Base) MCG/ACT inhaler, Inhale 1-2 puffs into the lungs every 4 (four) hours as needed for wheezing or shortness of breath. (Patient not taking: Reported on 04/17/2022), Disp: , Rfl:    ALPRAZolam (XANAX) 0.25 MG tablet, alprazolam 0.25 mg tablet  PLEASE SEE ATTACHED FOR DETAILED DIRECTIONS, Disp: , Rfl:    aspirin EC 81 MG tablet, Take 81 mg by mouth daily. (Patient not taking: Reported on 04/17/2022), Disp: , Rfl:    atorvastatin (LIPITOR) 80 MG tablet, Take 80 mg by mouth at bedtime., Disp: , Rfl: 0   Cholecalciferol (VITAMIN D) 2000 units tablet, Take 2,000 Units by mouth daily. (Patient not taking: Reported on 04/17/2022), Disp: , Rfl:    Cholecalciferol (VITAMIN D3) 50 MCG (2000 UT) capsule, Take 2,000 Units by mouth daily., Disp: , Rfl:    cyclobenzaprine (FLEXERIL) 5 MG tablet, Take 5 mg by mouth 2 (two) times daily as needed., Disp: , Rfl:    dexlansoprazole (DEXILANT) 60 MG capsule, Take 60 mg by mouth daily. , Disp: , Rfl:    EPINEPHrine 0.3 mg/0.3 mL IJ SOAJ injection, epinephrine 0.3 mg/0.3 mL injection, auto-injector, Disp: , Rfl:    ezetimibe (ZETIA) 10 MG tablet, Take 10 mg by mouth daily., Disp: , Rfl:    Fluticasone-Salmeterol (ADVAIR) 500-50 MCG/DOSE AEPB, Inhale 1 puff into the lungs 2 (two) times  daily. (Patient not taking: Reported on 04/17/2022), Disp: , Rfl:    gabapentin (NEURONTIN) 300 MG capsule, Take 2 capsules by mouth at bedtime., Disp: , Rfl:    glipiZIDE (GLUCOTROL XL) 10 MG 24 hr tablet, glipizide ER 10 mg tablet, extended release 24 hr (Patient not taking: Reported on 04/17/2022), Disp: , Rfl:    glucose blood test strip, OneTouch Ultra Blue Test Strip  TEST BLOOD GLUCOSE LEVELS TWICE DAILY, Disp: , Rfl:    HYDROcodone-acetaminophen (NORCO/VICODIN) 5-325 MG tablet, Take by mouth., Disp: , Rfl:    ibuprofen (ADVIL,MOTRIN) 800 MG tablet, Take 800 mg by mouth every 6 (six) hours as needed for mild pain or moderate pain. , Disp: , Rfl:    ipratropium-albuterol (DUONEB) 0.5-2.5 (3) MG/3ML SOLN, Take 3 mLs by nebulization 3 (three) times daily as needed (for shortness of breath or wheezing). , Disp: , Rfl:    isometheptene-acetaminophen-dichloralphenazone (MIDRIN) 65-100-325 MG capsule, Take 1-2 capsules by mouth 2 (two) times daily as needed for migraine.  (Patient not taking: Reported on 04/17/2022), Disp: , Rfl:    JANUMET XR 50-500 MG TB24, Take 1 tablet by mouth daily., Disp: , Rfl:    levothyroxine (SYNTHROID) 50 MCG tablet, Take by mouth. (Patient not taking: Reported on 04/17/2022), Disp: , Rfl:    levothyroxine (SYNTHROID, LEVOTHROID) 100 MCG tablet, Take 100 mcg by mouth daily before breakfast., Disp: , Rfl:    lisinopril (PRINIVIL,ZESTRIL) 10 MG tablet, Take 1 tablet by mouth daily., Disp: ,  Rfl:    metoprolol succinate (TOPROL-XL) 25 MG 24 hr tablet, Take 1 tablet by mouth daily., Disp: , Rfl:    metoprolol succinate (TOPROL-XL) 25 MG 24 hr tablet, metoprolol succinate ER 25 mg tablet,extended release 24 hr  TAKE 1 TABLET BY MOUTH ONCE EVERY EVENING, Disp: , Rfl:    montelukast (SINGULAIR) 10 MG tablet, Take 10 mg by mouth at bedtime. (Patient not taking: Reported on 04/17/2022), Disp: , Rfl:    montelukast (SINGULAIR) 10 MG tablet, Take 1 tablet by mouth daily., Disp: , Rfl:     ondansetron (ZOFRAN) 8 MG tablet, Take 8 mg by mouth every 8 (eight) hours as needed for nausea or vomiting. , Disp: , Rfl:    pantoprazole (PROTONIX) 40 MG tablet, Take 40 mg by mouth daily., Disp: , Rfl:    pantoprazole (PROTONIX) 40 MG tablet, pantoprazole 40 mg tablet,delayed release  TAKE 1 TABLET BY MOUTH EVERY DAY (Patient not taking: Reported on 04/17/2022), Disp: , Rfl:    predniSONE (DELTASONE) 20 MG tablet, prednisone 20 mg tablet  TAKE 1 TABLET BY MOUTH EVERY DAY, Disp: , Rfl:    Semaglutide, 2 MG/DOSE, (OZEMPIC, 2 MG/DOSE,) 8 MG/3ML SOPN, Ozempic 2 mg/dose (8 mg/3 mL) subcutaneous pen injector, Disp: , Rfl:    TRELEGY ELLIPTA 100-62.5-25 MCG/INH AEPB, Take 1 puff by mouth daily. (Patient not taking: Reported on 04/17/2022), Disp: , Rfl:    TRELEGY ELLIPTA 200-62.5-25 MCG/ACT AEPB, Inhale 1 puff into the lungs daily., Disp: , Rfl:   Past Medical History: Past Medical History:  Diagnosis Date   Breast cancer ([place])    COPD (chronic obstructive pulmonary disease) ([place])    Diabetes mellitus without complication ([place])    H/O breast implant    Hypertension    Last menstrual period (LMP) > 10 days ago 2006    Tobacco Use: Social History   Tobacco Use  Smoking Status Former   Packs/day: 0.25   Years: 25.00   Total pack years: 6.25   Types: Cigarettes   Quit date: 06/14/2014   Years since quitting: 7.9  Smokeless Tobacco Never    Labs: Review Flowsheet       Latest Ref Rng & Units 03/08/2016  Labs for ITP Cardiac and Pulmonary Rehab  Hemoglobin A1c 4.0 - 6.0 % 7.0      Pulmonary Assessment Scores:  Pulmonary Assessment Scores     Row Name 04/27/22 1001         ADL UCSD   ADL Phase Entry     SOB Score total 72     Rest 2     Walk 3     Stairs 5     Bath 1     Dress 2     Shop 3       CAT Score   CAT Score 23       mMRC Score   mMRC Score 2              UCSD: Self-administered rating of dyspnea associated with activities of daily living  (ADLs) 6-point scale (0 = "not at all" to 5 = "maximal or unable to do because of breathlessness")  Scoring Scores range from 0 to 120.  Minimally important difference is 5 units  CAT: CAT can identify the health impairment of COPD patients and is better correlated with disease progression.  CAT has a scoring range of zero to 40. The CAT score is classified into four groups of low (less than 10), medium (  10 - 20), high (21-30) and very high (31-40) based on the impact level of disease on health status. A CAT score over 10 suggests significant symptoms.  A worsening CAT score could be explained by an exacerbation, poor medication adherence, poor inhaler technique, or progression of COPD or comorbid conditions.  CAT MCID is 2 points  mMRC: mMRC (Modified Medical Research Council) Dyspnea Scale is used to assess the degree of baseline functional disability in patients of respiratory disease due to dyspnea. No minimal important difference is established. A decrease in score of 1 point or greater is considered a positive change.   Pulmonary Function Assessment:  Pulmonary Function Assessment - 04/17/22 1001       Breath   Shortness of Breath Yes;Limiting activity             Exercise Target Goals: Exercise Program Goal: Individual exercise prescription set using results from initial 6 min walk test and THRR while considering  patient's activity barriers and safety.   Exercise Prescription Goal: Initial exercise prescription builds to 30-45 minutes a day of aerobic activity, 2-3 days per week.  Home exercise guidelines will be given to patient during program as part of exercise prescription that the participant will acknowledge.  Education: Aerobic Exercise: - Group verbal and visual presentation on the components of exercise prescription. Introduces F.I.T.T principle from ACSM for exercise prescriptions.  Reviews F.I.T.T. principles of aerobic exercise including progression. Written  material given at graduation.   Education: Resistance Exercise: - Group verbal and visual presentation on the components of exercise prescription. Introduces F.I.T.T principle from ACSM for exercise prescriptions  Reviews F.I.T.T. principles of resistance exercise including progression. Written material given at graduation.    Education: Exercise & Equipment Safety: - Individual verbal instruction and demonstration of equipment use and safety with use of the equipment. Flowsheet Row Pulmonary Rehab from 05/25/2022 in [facility] Cardiac and Pulmonary Rehab  Date 04/17/22  [facility]  Instruction Review Code 1- Verbalizes Understanding       Education: Exercise Physiology & General Exercise Guidelines: - Group verbal and written instruction with models to review the exercise physiology of the cardiovascular system and associated critical values. Provides general exercise guidelines with specific guidelines to those with heart or lung disease.    Education: Flexibility, Balance, Mind/Body Relaxation: - Group verbal and visual presentation with interactive activity on the components of exercise prescription. Introduces F.I.T.T principle from ACSM for exercise prescriptions. Reviews F.I.T.T. principles of flexibility and balance exercise training including progression. Also discusses the mind body connection.  Reviews various relaxation techniques to help reduce and manage stress (i.e. Deep breathing, progressive muscle relaxation, and visualization). Balance handout provided to take home. Written material given at graduation.   Activity Barriers & Risk Stratification:  Activity Barriers & Cardiac Risk Stratification - 04/27/22 1108       Activity Barriers & Cardiac Risk Stratification   Activity Barriers Other (comment);Deconditioning;Muscular Weakness    Comments Limited ROM above head             6 Minute Walk:  6 Minute Walk     Row Name 04/27/22 1010         6 Minute Walk    Phase Initial     Distance 1260 feet     Walk Time 6 minutes     MPH 2.39     METS 2.37     RPE 12     Perceived Dyspnea  2     VO2 Peak 8.31  Symptoms Yes (comment)  SOB     Comments SOB     Resting HR 76 bpm     Resting BP 124/72     Resting Oxygen Saturation  94 %     Exercise Oxygen Saturation  during 6 min walk 89 %     Max Ex. HR 100 bpm     Max Ex. BP 118/68     2 Minute Post BP 112/70       Interval HR   1 Minute HR 85     2 Minute HR 94     3 Minute HR 94     4 Minute HR 98     5 Minute HR 97     6 Minute HR 100     2 Minute Post HR 77     Interval Heart Rate? Yes       Interval Oxygen   Interval Oxygen? Yes     Baseline Oxygen Saturation % 94 %     1 Minute Oxygen Saturation % 92 %     1 Minute Liters of Oxygen 0 L  RA     2 Minute Oxygen Saturation % 91 %     2 Minute Liters of Oxygen 0 L     3 Minute Oxygen Saturation % 89 %     3 Minute Liters of Oxygen 0 L     4 Minute Oxygen Saturation % 89 %     4 Minute Liters of Oxygen 0 L     5 Minute Oxygen Saturation % 90 %     5 Minute Liters of Oxygen 0 L     6 Minute Oxygen Saturation % 90 %     6 Minute Liters of Oxygen 0 L     2 Minute Post Oxygen Saturation % 96 %     2 Minute Post Liters of Oxygen 0 L             Oxygen Initial Assessment:  Oxygen Initial Assessment - 04/27/22 1110       Home Oxygen   Home Oxygen Device None    Sleep Oxygen Prescription None    Home Exercise Oxygen Prescription None    Home Resting Oxygen Prescription None    Compliance with Home Oxygen Use Yes      Initial 6 min Walk   Oxygen Used None      Program Oxygen Prescription   Program Oxygen Prescription None      Intervention   Short Term Goals To learn and understand importance of monitoring SPO2 with pulse oximeter and demonstrate accurate use of the pulse oximeter.;To learn and understand importance of maintaining oxygen saturations>88%;To learn and demonstrate proper use of respiratory medications;To  learn and demonstrate proper pursed lip breathing techniques or other breathing techniques.     Long  Term Goals Verbalizes importance of monitoring SPO2 with pulse oximeter and return demonstration;Maintenance of O2 saturations>88%;Exhibits proper breathing techniques, such as pursed lip breathing or other method taught during program session;Compliance with respiratory medication;Demonstrates proper use of MDI's             Oxygen Re-Evaluation:  Oxygen Re-Evaluation     Row Name 05/09/22 0756 05/16/22 0814           Program Oxygen Prescription   Program Oxygen Prescription None None        Home Oxygen   Home Oxygen Device None None      Sleep Oxygen Prescription None None  Home Exercise Oxygen Prescription None None      Home Resting Oxygen Prescription None None      Compliance with Home Oxygen Use Yes Yes        Goals/Expected Outcomes   Short Term Goals To learn and understand importance of monitoring SPO2 with pulse oximeter and demonstrate accurate use of the pulse oximeter.;To learn and understand importance of maintaining oxygen saturations>88%;To learn and demonstrate proper pursed lip breathing techniques or other breathing techniques.  To learn and understand importance of monitoring SPO2 with pulse oximeter and demonstrate accurate use of the pulse oximeter.;To learn and understand importance of maintaining oxygen saturations>88%;To learn and demonstrate proper pursed lip breathing techniques or other breathing techniques.       Long  Term Goals Verbalizes importance of monitoring SPO2 with pulse oximeter and return demonstration;Maintenance of O2 saturations>88%;Exhibits proper breathing techniques, such as pursed lip breathing or other method taught during program session;Compliance with respiratory medication Verbalizes importance of monitoring SPO2 with pulse oximeter and return demonstration;Maintenance of O2 saturations>88%;Exhibits proper breathing techniques,  such as pursed lip breathing or other method taught during program session;Compliance with respiratory medication      Comments Reviewed PLB technique with pt.  Talked about how it works and it's importance in maintaining their exercise saturations. Patient reported that she does use PLB at home and during exercise to help control SOB. She also reports taking all pumonary medications. She stated she was having a bad breathing day today and we discussed how to use caution when there is poor air quality alerts.      Goals/Expected Outcomes Short: Become more profiecient at using PLB.   Long: Become independent at using PLB. Short: continue to attend pulmonary rehab to help improve SOB. Long: become independent with exercise program and PLB.               Oxygen Discharge (Final Oxygen Re-Evaluation):  Oxygen Re-Evaluation - 05/16/22 0814       Program Oxygen Prescription   Program Oxygen Prescription None      Home Oxygen   Home Oxygen Device None    Sleep Oxygen Prescription None    Home Exercise Oxygen Prescription None    Home Resting Oxygen Prescription None    Compliance with Home Oxygen Use Yes      Goals/Expected Outcomes   Short Term Goals To learn and understand importance of monitoring SPO2 with pulse oximeter and demonstrate accurate use of the pulse oximeter.;To learn and understand importance of maintaining oxygen saturations>88%;To learn and demonstrate proper pursed lip breathing techniques or other breathing techniques.     Long  Term Goals Verbalizes importance of monitoring SPO2 with pulse oximeter and return demonstration;Maintenance of O2 saturations>88%;Exhibits proper breathing techniques, such as pursed lip breathing or other method taught during program session;Compliance with respiratory medication    Comments Patient reported that she does use PLB at home and during exercise to help control SOB. She also reports taking all pumonary medications. She stated she was  having a bad breathing day today and we discussed how to use caution when there is poor air quality alerts.    Goals/Expected Outcomes Short: continue to attend pulmonary rehab to help improve SOB. Long: become independent with exercise program and PLB.             Initial Exercise Prescription:  Initial Exercise Prescription - 04/27/22 1100       Date of Initial Exercise RX and Referring Provider   Date 04/27/22  Referring Provider Ottie Glazier MD      Oxygen   Maintain Oxygen Saturation 88% or higher      Treadmill   MPH 2.2    Grade 0    Minutes 15    METs 2.68      NuStep   Level 2    SPM 80    Minutes 15    METs 2.3      REL-XR   Level 1    Speed 50    Minutes 15    METs 2.3      Prescription Details   Frequency (times per week) 3    Duration Progress to 30 minutes of continuous aerobic without signs/symptoms of physical distress      Intensity   THRR 40-80% of Max Heartrate 107 - 139    Ratings of Perceived Exertion 11-13    Perceived Dyspnea 0-4      Progression   Progression Continue to progress workloads to maintain intensity without signs/symptoms of physical distress.      Resistance Training   Training Prescription Yes    Weight 3 lb    Reps 10-15             Perform Capillary Blood Glucose checks as needed.  Exercise Prescription Changes:   Exercise Prescription Changes     Row Name 04/27/22 1100 05/15/22 0900           Response to Exercise   Blood Pressure (Admit) 124/72 134/70      Blood Pressure (Exercise) 118/68 144/72      Blood Pressure (Exit) 112/70 126/64      Heart Rate (Admit) 76 bpm 70 bpm      Heart Rate (Exercise) 100 bpm 110 bpm      Heart Rate (Exit) 77 bpm 96 bpm      Oxygen Saturation (Admit) 94 % 94 %      Oxygen Saturation (Exercise) 89 % 88 %      Oxygen Saturation (Exit) 96 % 95 %      Rating of Perceived Exertion (Exercise) 12 13      Perceived Dyspnea (Exercise) 2 2      Symptoms SOB SOB       Comments walk test results --      Duration -- Progress to 30 minutes of  aerobic without signs/symptoms of physical distress      Intensity -- THRR unchanged        Progression   Progression -- Continue to progress workloads to maintain intensity without signs/symptoms of physical distress.      Average METs -- 2.93        Resistance Training   Training Prescription -- Yes      Weight -- 3 lb      Reps -- 10-15        Treadmill   MPH -- 2.2      Grade -- 0.5      Minutes -- 15      METs -- 2.84        NuStep   Level -- 2      Minutes -- 15      METs -- 3.2        REL-XR   Level -- 2      Minutes -- 15      METs -- 3.8        Oxygen   Maintain Oxygen Saturation -- 88% or higher  Exercise Comments:   Exercise Comments     Row Name 05/09/22 0756           Exercise Comments First full day of exercise!  Patient was oriented to gym and equipment including functions, settings, policies, and procedures.  Patient's individual exercise prescription and treatment plan were reviewed.  All starting workloads were established based on the results of the 6 minute walk test done at initial orientation visit.  The plan for exercise progression was also introduced and progression will be customized based on patient's performance and goals.                Exercise Goals and Review:   Exercise Goals     Row Name 04/27/22 1126             Exercise Goals   Increase Physical Activity Yes       Intervention Provide advice, education, support and counseling about physical activity/exercise needs.;Develop an individualized exercise prescription for aerobic and resistive training based on initial evaluation findings, risk stratification, comorbidities and participant's personal goals.       Expected Outcomes Short Term: Attend rehab on a regular basis to increase amount of physical activity.;Long Term: Add in home exercise to make exercise part of routine and to  increase amount of physical activity.;Long Term: Exercising regularly at least 3-5 days a week.       Increase Strength and Stamina Yes       Intervention Provide advice, education, support and counseling about physical activity/exercise needs.;Develop an individualized exercise prescription for aerobic and resistive training based on initial evaluation findings, risk stratification, comorbidities and participant's personal goals.       Expected Outcomes Short Term: Increase workloads from initial exercise prescription for resistance, speed, and METs.;Short Term: Perform resistance training exercises routinely during rehab and add in resistance training at home;Long Term: Improve cardiorespiratory fitness, muscular endurance and strength as measured by increased METs and functional capacity (6MWT)       Able to understand and use rate of perceived exertion (RPE) scale Yes       Intervention Provide education and explanation on how to use RPE scale       Expected Outcomes Short Term: Able to use RPE daily in rehab to express subjective intensity level;Long Term:  Able to use RPE to guide intensity level when exercising independently       Able to understand and use Dyspnea scale Yes       Intervention Provide education and explanation on how to use Dyspnea scale       Expected Outcomes Short Term: Able to use Dyspnea scale daily in rehab to express subjective sense of shortness of breath during exertion;Long Term: Able to use Dyspnea scale to guide intensity level when exercising independently       Knowledge and understanding of Target Heart Rate Range (THRR) Yes       Intervention Provide education and explanation of THRR including how the numbers were predicted and where they are located for reference       Expected Outcomes Short Term: Able to state/look up THRR;Long Term: Able to use THRR to govern intensity when exercising independently;Short Term: Able to use daily as guideline for intensity in  rehab       Able to check pulse independently Yes       Intervention Provide education and demonstration on how to check pulse in carotid and radial arteries.;Review the importance of being able to check your  own pulse for safety during independent exercise       Expected Outcomes Short Term: Able to explain why pulse checking is important during independent exercise;Long Term: Able to check pulse independently and accurately       Understanding of Exercise Prescription Yes       Intervention Provide education, explanation, and written materials on patient's individual exercise prescription       Expected Outcomes Short Term: Able to explain program exercise prescription;Long Term: Able to explain home exercise prescription to exercise independently                Exercise Goals Re-Evaluation :  Exercise Goals Re-Evaluation     Row Name 05/09/22 0756 05/15/22 0951 05/16/22 0805         Exercise Goal Re-Evaluation   Exercise Goals Review Increase Physical Activity;Able to understand and use rate of perceived exertion (RPE) scale;Knowledge and understanding of Target Heart Rate Range (THRR);Understanding of Exercise Prescription;Increase Strength and Stamina;Able to understand and use Dyspnea scale;Able to check pulse independently Increase Physical Activity;Increase Strength and Stamina;Understanding of Exercise Prescription Increase Physical Activity;Increase Strength and Stamina;Able to understand and use rate of perceived exertion (RPE) scale;Able to understand and use Dyspnea scale;Knowledge and understanding of Target Heart Rate Range (THRR);Able to check pulse independently;Understanding of Exercise Prescription     Comments Reviewed RPE and dyspnea scales, THR and program prescription with pt today.  Pt voiced understanding and was given a copy of goals to take home. Shilpa is off to a good start in rehab. She had an average MET level of 2.93 METs. She also did well with the treadmill at a  speed of 2.2 mph and an incline of 0.5%. She increased to level 2 on the XR as well. We will continue to monitor Kaelani's progress in the program. Charlett feels that she has made improvments in her strength and SOB since starting the program. She was having a bad day today due to bad air quality, but for the most part has noticed improvements and is able to preform all ADL but does take breaks when needed.     Expected Outcomes Short: Use RPE daily to regulate intensity. Long: Follow program prescription in THR. Short: Continue to attend rehab. Long: Continue to improve strength and stamina. Short: continue consistent attendance in rehab. Long: continue to improve strength and stamina so she can take fewer breaks when preforming daily activites.              Discharge Exercise Prescription (Final Exercise Prescription Changes):  Exercise Prescription Changes - 05/15/22 0900       Response to Exercise   Blood Pressure (Admit) 134/70    Blood Pressure (Exercise) 144/72    Blood Pressure (Exit) 126/64    Heart Rate (Admit) 70 bpm    Heart Rate (Exercise) 110 bpm    Heart Rate (Exit) 96 bpm    Oxygen Saturation (Admit) 94 %    Oxygen Saturation (Exercise) 88 %    Oxygen Saturation (Exit) 95 %    Rating of Perceived Exertion (Exercise) 13    Perceived Dyspnea (Exercise) 2    Symptoms SOB    Duration Progress to 30 minutes of  aerobic without signs/symptoms of physical distress    Intensity THRR unchanged      Progression   Progression Continue to progress workloads to maintain intensity without signs/symptoms of physical distress.    Average METs 2.93      Resistance Training   Training  Prescription Yes    Weight 3 lb    Reps 10-15      Treadmill   MPH 2.2    Grade 0.5    Minutes 15    METs 2.84      NuStep   Level 2    Minutes 15    METs 3.2      REL-XR   Level 2    Minutes 15    METs 3.8      Oxygen   Maintain Oxygen Saturation 88% or higher              Nutrition:  Target Goals: Understanding of nutrition guidelines, daily intake of sodium '1500mg'$ , cholesterol '200mg'$ , calories 30% from fat and 7% or less from saturated fats, daily to have 5 or more servings of fruits and vegetables.  Education: All About Nutrition: -Group instruction provided by verbal, written material, interactive activities, discussions, models, and posters to present general guidelines for heart healthy nutrition including fat, fiber, MyPlate, the role of sodium in heart healthy nutrition, utilization of the nutrition label, and utilization of this knowledge for meal planning. Follow up email sent as well. Written material given at graduation. Flowsheet Row Pulmonary Rehab from 05/25/2022 in [facility] Cardiac and Pulmonary Rehab  Date 05/25/22  Educator [facility]  Instruction Review Code 1- Verbalizes Understanding       Biometrics:  Pre Biometrics - 04/27/22 1108       Pre Biometrics   Height 5' 0.2" (1.529 m)    Weight 195 lb 14.4 oz (88.9 kg)    BMI (Calculated) 38.01    Single Leg Stand 13.3 seconds              Nutrition Therapy Plan and Nutrition Goals:  Nutrition Therapy & Goals - 04/27/22 1110       Intervention Plan   Intervention Prescribe, educate and counsel regarding individualized specific dietary modifications aiming towards targeted core components such as weight, hypertension, lipid management, diabetes, heart failure and other comorbidities.    Expected Outcomes Short Term Goal: Understand basic principles of dietary content, such as calories, fat, sodium, cholesterol and nutrients.;Short Term Goal: A plan has been developed with personal nutrition goals set during dietitian appointment.;Long Term Goal: Adherence to prescribed nutrition plan.             Nutrition Assessments:  MEDIFICTS Score Key: ?70 Need to make dietary changes  40-70 Heart Healthy Diet ? 40 Therapeutic Level Cholesterol Diet  Flowsheet Row Pulmonary Rehab from  04/27/2022 in [facility] Cardiac and Pulmonary Rehab  Picture Your Plate Total Score on Admission 51      Picture Your Plate Scores: <44 Unhealthy dietary pattern with much room for improvement. 41-50 Dietary pattern unlikely to meet recommendations for good health and room for improvement. 51-60 More healthful dietary pattern, with some room for improvement.  >60 Healthy dietary pattern, although there may be some specific behaviors that could be improved.   Nutrition Goals Re-Evaluation:  Nutrition Goals Re-Evaluation     [place] Name 05/16/22 0831             Goals   Comment has not met with dietician yet       Expected Outcome schedule appointment with dietician to establish dietary goals.                Nutrition Goals Discharge (Final Nutrition Goals Re-Evaluation):  Nutrition Goals Re-Evaluation - 05/16/22 0831       Goals   Comment has  not met with dietician yet    Expected Outcome schedule appointment with dietician to establish dietary goals.             Psychosocial: Target Goals: Acknowledge presence or absence of significant depression and/or stress, maximize coping skills, provide positive support system. Participant is able to verbalize types and ability to use techniques and skills needed for reducing stress and depression.   Education: Stress, Anxiety, and Depression - Group verbal and visual presentation to define topics covered.  Reviews how body is impacted by stress, anxiety, and depression.  Also discusses healthy ways to reduce stress and to treat/manage anxiety and depression.  Written material given at graduation.   Education: Sleep Hygiene -Provides group verbal and written instruction about how sleep can affect your health.  Define sleep hygiene, discuss sleep cycles and impact of sleep habits. Review good sleep hygiene tips.    Initial Review & Psychosocial Screening:  Initial Psych Review & Screening - 04/17/22 1003       Initial Review    Current issues with History of Depression      Family Dynamics   Good Support System? Yes    Comments Aurilla can look to her son and her sister for support. Other than these two people she does not have any other sources of support. She walks with her dog but is not able to go very far and is ready to improve her shortness of breath.      Barriers   Psychosocial barriers to participate in program The patient should benefit from training in stress management and relaxation.      Screening Interventions   Interventions Provide feedback about the scores to participant;Encouraged to exercise;To provide support and resources with identified psychosocial needs    Expected Outcomes Short Term goal: Utilizing psychosocial counselor, staff and physician to assist with identification of specific Stressors or current issues interfering with healing process. Setting desired goal for each stressor or current issue identified.;Long Term Goal: Stressors or current issues are controlled or eliminated.;Short Term goal: Identification and review with participant of any Quality of Life or Depression concerns found by scoring the questionnaire.;Long Term goal: The participant improves quality of Life and PHQ9 Scores as seen by post scores and/or verbalization of changes             Quality of Life Scores:  Scores of 19 and below usually indicate a poorer quality of life in these areas.  A difference of  2-3 points is a clinically meaningful difference.  A difference of 2-3 points in the total score of the Quality of Life Index has been associated with significant improvement in overall quality of life, self-image, physical symptoms, and general health in studies assessing change in quality of life.  PHQ-9: Review Flowsheet       05/23/2022 04/27/2022  Depression screen PHQ 2/9  Decreased Interest 2 1  Down, Depressed, Hopeless 2 1  PHQ - 2 Score 4 2  Altered sleeping 0 2  Tired, decreased energy 0 2   Change in appetite 0 1  Feeling bad or failure about yourself  1 1  Trouble concentrating 0 0  Moving slowly or fidgety/restless 0 0  Suicidal thoughts 0 0  PHQ-9 Score 5 8  Difficult doing work/chores Somewhat difficult Very difficult   Interpretation of Total Score  Total Score Depression Severity:  1-4 = Minimal depression, 5-9 = Mild depression, 10-14 = Moderate depression, 15-19 = Moderately severe depression, 20-27 = Severe depression  Psychosocial Evaluation and Intervention:  Psychosocial Evaluation - 04/17/22 1005       Psychosocial Evaluation & Interventions   Interventions Encouraged to exercise with the program and follow exercise prescription;Relaxation education;Stress management education    Comments Markesha can look to her son and her sister for support. Other than these two people she does not have any other sources of support. She walks with her dog but is not able to go very far and is ready to improve her shortness of breath.    Expected Outcomes Short: Start [facility] to help with mood. Long: Maintain a healthy mental state.    Continue Psychosocial Services  Follow up required by staff             Psychosocial Re-Evaluation:  Psychosocial Re-Evaluation     Row Name 05/16/22 [phone]             Psychosocial Re-Evaluation   Current issues with History of Depression;Current Stress Concerns       Comments Patient reports that she has some current stress concerns due to her rent being increased but that she does have the support that she needs and will get though it. She reports sleeping ok.       Expected Outcomes Short: continue to relay on support system for emtional and physical needs. Long: Independently mantain good mental health habits and continue to have support system in place in case of need.       Interventions Encouraged to attend Pulmonary Rehabilitation for the exercise       Continue Psychosocial Services  Follow up required by staff                 Psychosocial Discharge (Final Psychosocial Re-Evaluation):  Psychosocial Re-Evaluation - 05/16/22 0824       Psychosocial Re-Evaluation   Current issues with History of Depression;Current Stress Concerns    Comments Patient reports that she has some current stress concerns due to her rent being increased but that she does have the support that she needs and will get though it. She reports sleeping ok.    Expected Outcomes Short: continue to relay on support system for emtional and physical needs. Long: Independently mantain good mental health habits and continue to have support system in place in case of need.    Interventions Encouraged to attend Pulmonary Rehabilitation for the exercise    Continue Psychosocial Services  Follow up required by staff             Education: Education Goals: Education classes will be provided on a weekly basis, covering required topics. Participant will state understanding/return demonstration of topics presented.  Learning Barriers/Preferences:  Learning Barriers/Preferences - 04/17/22 1001       Learning Barriers/Preferences   Learning Barriers None    Learning Preferences None             General Pulmonary Education Topics:  Infection Prevention: - Provides verbal and written material to individual with discussion of infection control including proper hand washing and proper equipment cleaning during exercise session. Flowsheet Row Pulmonary Rehab from 05/25/2022 in [facility] Cardiac and Pulmonary Rehab  Date 04/17/22  Educator [facility]  Instruction Review Code 1- Verbalizes Understanding       Falls Prevention: - Provides verbal and written material to individual with discussion of falls prevention and safety. Flowsheet Row Pulmonary Rehab from 05/25/2022 in [facility] Cardiac and Pulmonary Rehab  Date 04/17/22  Educator [facility]  Instruction Review Code 1- Verbalizes Understanding  Chronic Lung Disease Review: - Group verbal  instruction with posters, models, PowerPoint presentations and videos,  to review new updates, new respiratory medications, new advancements in procedures and treatments. Providing information on websites and "800" numbers for continued self-education. Includes information about supplement oxygen, available portable oxygen systems, continuous and intermittent flow rates, oxygen safety, concentrators, and Medicare reimbursement for oxygen. Explanation of Pulmonary Drugs, including class, frequency, complications, importance of spacers, rinsing mouth after steroid MDI's, and proper cleaning methods for nebulizers. Review of basic lung anatomy and physiology related to function, structure, and complications of lung disease. Review of risk factors. Discussion about methods for diagnosing sleep apnea and types of masks and machines for OSA. Includes a review of the use of types of environmental controls: home humidity, furnaces, filters, dust mite/pet prevention, HEPA vacuums. Discussion about weather changes, air quality and the benefits of nasal washing. Instruction on Warning signs, infection symptoms, calling MD promptly, preventive modes, and value of vaccinations. Review of effective airway clearance, coughing and/or vibration techniques. Emphasizing that all should Create an Action Plan. Written material given at graduation.   AED/CPR: - Group verbal and written instruction with the use of models to demonstrate the basic use of the AED with the basic ABC's of resuscitation.    Anatomy and Cardiac Procedures: - Group verbal and visual presentation and models provide information about basic cardiac anatomy and function. Reviews the testing methods done to diagnose heart disease and the outcomes of the test results. Describes the treatment choices: Medical Management, Angioplasty, or Coronary Bypass Surgery for treating various heart conditions including Myocardial Infarction, Angina, Valve Disease, and  Cardiac Arrhythmias.  Written material given at graduation. Flowsheet Row Pulmonary Rehab from 05/25/2022 in [facility] Cardiac and Pulmonary Rehab  Date 05/11/22  Educator [facility]  Instruction Review Code 1- Verbalizes Understanding       Medication Safety: - Group verbal and visual instruction to review commonly prescribed medications for heart and lung disease. Reviews the medication, class of the drug, and side effects. Includes the steps to properly store meds and maintain the prescription regimen.  Written material given at graduation.   Other: -Provides group and verbal instruction on various topics (see comments)   Knowledge Questionnaire Score:  Knowledge Questionnaire Score - 04/27/22 0951       Knowledge Questionnaire Score   Pre Score 17/18              Core Components/Risk Factors/Patient Goals at Admission:  Personal Goals and Risk Factors at Admission - 04/27/22 1127       Core Components/Risk Factors/Patient Goals on Admission    Weight Management Yes;Weight Loss;Obesity    Intervention Weight Management: Develop a combined nutrition and exercise program designed to reach desired caloric intake, while maintaining appropriate intake of nutrient and fiber, sodium and fats, and appropriate energy expenditure required for the weight goal.;Weight Management: Provide education and appropriate resources to help participant work on and attain dietary goals.;Weight Management/Obesity: Establish reasonable short term and long term weight goals.    Admit Weight 195 lb (88.5 kg)    Goal Weight: Short Term 190 lb (86.2 kg)    Goal Weight: Long Term 170 lb (77.1 kg)    Expected Outcomes Short Term: Continue to assess and modify interventions until short term weight is achieved;Long Term: Adherence to nutrition and physical activity/exercise program aimed toward attainment of established weight goal;Weight Loss: Understanding of general recommendations for a balanced deficit meal plan,  which promotes 1-2 lb weight loss per  week and includes a negative energy balance of [phone] kcal/d;Understanding recommendations for meals to include 15-35% energy as protein, 25-35% energy from fat, 35-60% energy from carbohydrates, less than $RemoveB'200mg'mcunkUgf$  of dietary cholesterol, 20-35 gm of total fiber daily;Understanding of distribution of calorie intake throughout the day with the consumption of 4-5 meals/snacks    Improve shortness of breath with ADL's Yes    Intervention Provide education, individualized exercise plan and daily activity instruction to help decrease symptoms of SOB with activities of daily living.    Expected Outcomes Short Term: Improve cardiorespiratory fitness to achieve a reduction of symptoms when performing ADLs;Long Term: Be able to perform more ADLs without symptoms or delay the onset of symptoms    Diabetes Yes    Intervention Provide education about signs/symptoms and action to take for hypo/hyperglycemia.;Provide education about proper nutrition, including hydration, and aerobic/resistive exercise prescription along with prescribed medications to achieve blood glucose in normal ranges: Fasting glucose 65-99 mg/dL    Expected Outcomes Short Term: Participant verbalizes understanding of the signs/symptoms and immediate care of hyper/hypoglycemia, proper foot care and importance of medication, aerobic/resistive exercise and nutrition plan for blood glucose control.;Long Term: Attainment of HbA1C < 7%.    Hypertension Yes    Intervention Provide education on lifestyle modifcations including regular physical activity/exercise, weight management, moderate sodium restriction and increased consumption of fresh fruit, vegetables, and low fat dairy, alcohol moderation, and smoking cessation.;Monitor prescription use compliance.    Expected Outcomes Short Term: Continued assessment and intervention until BP is < 140/31mm HG in hypertensive participants. < 130/86mm HG in hypertensive  participants with diabetes, heart failure or chronic kidney disease.;Long Term: Maintenance of blood pressure at goal levels.    Lipids Yes    Intervention Provide education and support for participant on nutrition & aerobic/resistive exercise along with prescribed medications to achieve LDL '70mg'$ , HDL >$Remo'40mg'FdfNm$ .    Expected Outcomes Short Term: Participant states understanding of desired cholesterol values and is compliant with medications prescribed. Participant is following exercise prescription and nutrition guidelines.;Long Term: Cholesterol controlled with medications as prescribed, with individualized exercise RX and with personalized nutrition plan. Value goals: LDL < $Rem'70mg'GURG$ , HDL > 40 mg.             Education:Diabetes - Individual verbal and written instruction to review signs/symptoms of diabetes, desired ranges of glucose level fasting, after meals and with exercise. Acknowledge that pre and post exercise glucose checks will be done for 3 sessions at entry of program. Flowsheet Row Pulmonary Rehab from 05/25/2022 in [facility] Cardiac and Pulmonary Rehab  Date 04/17/22  Educator [facility]  Instruction Review Code 1- Verbalizes Understanding       Know Your Numbers and Heart Failure: - Group verbal and visual instruction to discuss disease risk factors for cardiac and pulmonary disease and treatment options.  Reviews associated critical values for Overweight/Obesity, Hypertension, Cholesterol, and Diabetes.  Discusses basics of heart failure: signs/symptoms and treatments.  Introduces Heart Failure Zone chart for action plan for heart failure.  Written material given at graduation.   Core Components/Risk Factors/Patient Goals Review:   Goals and Risk Factor Review     Row Name 05/16/22 0819             Core Components/Risk Factors/Patient Goals Review   Personal Goals Review Weight Management/Obesity;Improve shortness of breath with ADL's;Lipids;Diabetes;Hypertension       Review Patient  reports that she now montors blood sugar at home and that it is usually within acceptable ranges. She also reports  taking all meds as prescribed. She also weighs at home and states that her weight has been consistent.       Expected Outcomes Short: continue to take medications and monitor blood glucose and weight at home. Long: become indpendent with managing risk factors though medications, exercise, and nutrition.                Core Components/Risk Factors/Patient Goals at Discharge (Final Review):   Goals and Risk Factor Review - 05/16/22 0819       Core Components/Risk Factors/Patient Goals Review   Personal Goals Review Weight Management/Obesity;Improve shortness of breath with ADL's;Lipids;Diabetes;Hypertension    Review Patient reports that she now montors blood sugar at home and that it is usually within acceptable ranges. She also reports taking all meds as prescribed. She also weighs at home and states that her weight has been consistent.    Expected Outcomes Short: continue to take medications and monitor blood glucose and weight at home. Long: become indpendent with managing risk factors though medications, exercise, and nutrition.             ITP Comments:  ITP Comments     Row Name 04/17/22 0959 04/27/22 1105 05/09/22 0756 05/10/22 0947 05/30/22 0743   ITP Comments Virtual Visit completed. Patient informed on EP and RD appointment and 6 Minute walk test. Patient also informed of patient health questionnaires on My Chart. Patient Verbalizes understanding. Visit diagnosis can be found in [facility] 01/13/2022. Completed 6MWT and gym orientation. Initial ITP created and sent for review to Dr. Ottie Glazier, Medical Director. First full day of exercise!  Patient was oriented to gym and equipment including functions, settings, policies, and procedures.  Patient's individual exercise prescription and treatment plan were reviewed.  All starting workloads were established based on the  results of the 6 minute walk test done at initial orientation visit.  The plan for exercise progression was also introduced and progression will be customized based on patient's performance and goals. 30 Day review completed. Medical Director ITP review done, changes made as directed, and signed approval by Medical Director.   NEW Hailly reports continued neck pain with exercise due to herniated disc which was agitated even with exercise modifications during rehab. She will meet with her MD regarding this pain; will discharge from program at this time.            Comments: Discharge ITP

## 2022-06-07 ENCOUNTER — Encounter: Payer: Self-pay | Admitting: *Deleted

## 2022-08-30 NOTE — H&P (Signed)
Autumn Ramos is a 66 y.o. female here for [place] Follow Up .   Here for follow up for PMB  and thickened stripe . Here for Fractional D+C and Hysteroscopy possible myosure   Embx: Comment: Part A-Endometrial Biopsy: ATROPHIC ENDOMETRIUM. NO HYPERPLASIA OR CARCINOMA.  Pap pending    U/s today : Uterus Retroverted Endometrium thickened, echogenic and with cystic appearance. Endometrium= 12.94 mm RT ovary appears wnl LT ovary not visualized TA/TV. B/L adnexas wnl No FF in CDS.     Past Medical History:  has a past medical history of Asthma without status asthmaticus, Breast cancer (CMS-HCC), COPD (chronic obstructive pulmonary disease) (CMS-HCC), Diabetes mellitus type 2, uncomplicated (CMS-HCC), GERD (gastroesophageal reflux disease), Hepatitis, Hyperlipidemia, Hypertension, and Thyroid disease.  Past Surgical History:  has a past surgical history that includes tonsilectomy; Cesarean section; other surgery (10/30/2014); bilateral implant exchange (03/31/2015); right mastopexy, left total capsulectomy and implant exchange (08/31/2015); bilateral implant exchange and nipple recontruction (09/29/2016); and left breast implant exchange with multi capsulectomies (01/28/2018). Family History: family history includes Diabetes in her mother; High blood pressure (Hypertension) in her father and mother; Myocardial Infarction (Heart attack) in her father and mother. Social History:  reports that she quit smoking about 8 years ago. Her smoking use included cigarettes. She has a 15.00 pack-year smoking history. She has never used smokeless tobacco. She reports that she does not drink alcohol and does not use drugs. OB/GYN History:  OB History       Gravida  4   Para  2   Term  2   Preterm      AB      Living  2        SAB      IAB      Ectopic      Molar      Multiple      Live Births  2             Allergies: is allergic to acetylcysteine, codeine, and tramadol. Medications:    Current Outpatient Medications:    albuterol (PROVENTIL HFA,VENTOLIN HFA) 90 mcg/actuation inhaler, 1 or 2 puff by inhalation every 4 hours as needed, Disp: , Rfl:    atorvastatin (LIPITOR) 10 MG tablet, Take 10 mg by mouth once daily., Disp: , Rfl:    cholecalciferol, vitamin D3, 50,000 unit capsule, Take 50,000 Units by mouth once a week., Disp: , Rfl:    fluticasone-umeclidinium-vilanterol (TRELEGY ELLIPTA) 100-62.5-25 mcg inhaler, Take 1 inhalation by mouth once daily, Disp: , Rfl:    gabapentin (NEURONTIN) 300 MG capsule, Take 2 capsules p.o. nightly, Disp: 180 capsule, Rfl: 3   glipiZIDE (GLUCOTROL XL) 10 MG XL tablet, Take 10 mg by mouth once daily, Disp: , Rfl:    HYDROcodone-acetaminophen (NORCO) 5-325 mg tablet, Take 1 tablet by mouth 2 (two) times daily as needed, Disp: 60 tablet, Rfl: 0   ibuprofen (ADVIL,MOTRIN) 800 MG tablet, , Disp: , Rfl: 0   levothyroxine (SYNTHROID, LEVOTHROID) 50 MCG tablet, Take 100 mcg by mouth once daily Take on an empty stomach with a glass of water at least 30-60 minutes before breakfast., Disp: , Rfl:    lisinopril (PRINIVIL,ZESTRIL) 10 MG tablet, Take 10 mg by mouth once daily., Disp: , Rfl:    metoprolol succinate (TOPROL-XL) 25 MG XL tablet, Take 1 tablet by mouth once daily, Disp: , Rfl:    montelukast (SINGULAIR) 10 mg tablet, Take 1 tablet by mouth once daily, Disp: , Rfl:  ondansetron (ZOFRAN) 8 MG tablet, , Disp: , Rfl: 0   ONETOUCH ULTRA TEST test strip, , Disp: , Rfl: 0   pantoprazole (PROTONIX) 40 MG DR tablet, Take 1 tablet (40 mg total) by mouth once daily, Disp: 30 tablet, Rfl: 11   semaglutide (RYBELSUS) 3 mg tablet, Take 3 mg by mouth once daily Do not cut, crush, or chew, Disp: , Rfl:    SITagliptin-metformin (JANUMET XR) 50-500 mg ER 24 hr multiphase tablet, Janumet XR 50 mg-500 mg tablet,extended release, Disp: , Rfl:    ALPRAZolam (XANAX) 0.25 MG tablet, Take 1 tablet p.o. as needed anxiety 45 minutes prior to procedure.  Must have  driver while on this medication.  Do not take with hydrocodone (Patient not taking: Reported on 07/21/2022), Disp: 1 tablet, Rfl: 0   aspirin 81 MG EC tablet, Take 81 mg by mouth once daily (Patient not taking: Reported on 06/22/2022), Disp: , Rfl:    dupilumab (DUPIXENT) 300 mg/2 mL pen injector, Inject 2 mLs (300 mg total) subcutaneously every 14 (fourteen) days (Patient not taking: Reported on 07/21/2022), Disp: 4 mL, Rfl: 11   predniSONE (DELTASONE) 20 MG tablet, Take 1 tablet (20 mg total) by mouth once daily (Patient not taking: Reported on 07/21/2022), Disp: 5 tablet, Rfl: 0   predniSONE (DELTASONE) 5 MG tablet, Take as directed - 6 day taper (Patient not taking: Reported on 07/21/2022), Disp: 21 tablet, Rfl: 0   semaglutide (OZEMPIC SUBQ), Inject subcutaneously (Patient not taking: Reported on 07/21/2022), Disp: , Rfl:    semaglutide (WEGOVY) 0.25 mg/0.5 mL pen injector, every 28 (twenty-eight) days (Patient not taking: Reported on 07/21/2022), Disp: , Rfl:    Review of Systems: General:                      No fatigue or weight loss Eyes:                           No vision changes Ears:                            No hearing difficulty Respiratory:                No cough or shortness of breath Pulmonary:                  No asthma or shortness of breath Cardiovascular:           No chest pain, palpitations, dyspnea on exertion Gastrointestinal:          No abdominal bloating, chronic diarrhea, constipations, masses, pain or hematochezia Genitourinary:             No hematuria, dysuria, abnormal vaginal discharge, pelvic pain, Menometrorrhagia Lymphatic:                   No swollen lymph nodes Musculoskeletal:         No muscle weakness Neurologic:                  No extremity weakness, syncope, seizure disorder Psychiatric:                  No history of depression, delusions or suicidal/homicidal ideation      Exam:       Vitals:  08/31/22   BP: 118/74  Pulse: 91      Body mass  index  is 38.08 kg/m.   WDWN /white female in NAD   Lungs: CTA  CV : RRR without murmur   Neck:  no thyromegaly Abdomen: soft , no mass, normal active bowel sounds,  non-tender, no rebound tenderness Pelvic: tanner stage 5 ,  External genitalia: vulva /labia no lesions Urethra: no prolapse Vagina: normal physiologic d/c Cervix: no lesions, no cervical motion tenderness   Uterus: normal size shape and contour, non-tender Adnexa: no mass,  non-tender   Rectovaginal:   Impression:    The primary encounter diagnosis was PMB (postmenopausal bleeding). A diagnosis of Thickened endometrium was also pertinent to this visit.       Plan:     Fx D+C and H/S  and possible myosure removal of polyps if found   Benefits and risks to surgery: The proposed benefit of the surgery has been discussed with the patient. The possible risks include, but are not limited to: organ injury to the bowel , bladder, ureters, and major blood vessels and nerves. There is a possibility of additional surgeries resulting from these injuries. There is also the risk of blood transfusion and the need to receive blood products during or after the procedure which may rarely lead to HIV or Hepatitis C infection. There is a risk of developing a deep venous thrombosis or a pulmonary embolism . There is the possibility of wound infection and also anesthetic complications, even the rare possibility of death. The patient understands these risks and wishes to proceed. All questions have been answered         Caroline Sauger, MD

## 2022-09-01 ENCOUNTER — Other Ambulatory Visit: Payer: Medicare Other

## 2022-09-01 ENCOUNTER — Other Ambulatory Visit: Payer: Self-pay

## 2022-09-01 ENCOUNTER — Encounter
Admission: RE | Admit: 2022-09-01 | Discharge: 2022-09-01 | Disposition: A | Discharge status: Home / Self Care | Payer: Medicare Other | Source: Ambulatory Visit | Attending: Obstetrics and Gynecology | Admitting: Obstetrics and Gynecology

## 2022-09-01 DIAGNOSIS — Z01812 Encounter for preprocedural laboratory examination: Secondary | ICD-10-CM

## 2022-09-01 DIAGNOSIS — Z862 Personal history of diseases of the blood and blood-forming organs and certain disorders involving the immune mechanism: Secondary | ICD-10-CM

## 2022-09-01 HISTORY — DX: Family history of other specified conditions: Z84.89

## 2022-09-01 HISTORY — DX: Hypothyroidism, unspecified: E03.9

## 2022-09-01 HISTORY — DX: Anemia, unspecified: D64.9

## 2022-09-01 HISTORY — DX: Other complications of anesthesia, initial encounter: T88.59XA

## 2022-09-01 HISTORY — DX: Pneumonia, unspecified organism: J18.9

## 2022-09-01 HISTORY — DX: Anxiety disorder, unspecified: F41.9

## 2022-09-01 HISTORY — DX: Gastro-esophageal reflux disease without esophagitis: K21.9

## 2022-09-01 NOTE — Patient Instructions (Addendum)
Your procedure is scheduled on: 09/11/22 - Monday Report to the Registration Desk on the 1st floor of the [facility]. To find out your arrival time, please call [phone] between 1PM - 3PM on: 09/08/22 - Friday If your arrival time is 6:00 am, do not arrive prior to that time as the [facility] entrance doors do not open until 6:00 am.  REMEMBER: Instructions that are not followed completely may result in serious medical risk, up to and including death; or upon the discretion of your surgeon and anesthesiologist your surgery may need to be rescheduled.  Do not eat food after midnight the night before surgery.  No gum chewing, lozengers or hard candies.  You may however, drink CLEAR liquids up to 2 hours before you are scheduled to arrive for your surgery. Do not drink anything within 2 hours of your scheduled arrival time. - Type 1 and Type 2 diabetics should only drink water.  In addition, your doctor has ordered for you to drink the provided  Gatorade G2 Drinking this carbohydrate drink up to two hours before surgery helps to reduce insulin resistance and improve patient outcomes. Please complete drinking 2 hours prior to scheduled arrival time.  TAKE THESE MEDICATIONS THE MORNING OF SURGERY WITH A SIP OF WATER:   - levothyroxine (SYNTHROID, LEVOTHROID)  - pantoprazole (PROTONIX)  -TRELEGY ELLIPTA    Inject only 1/2 of your prescribed insulin glargine (LANTUS) on the night before your surgery.  HOLD your JANUMET XR beginning 09/09/22.   HOLD lisinopril the day of surgery.  One week prior to surgery: Stop Anti-inflammatories (NSAIDS) such as Advil, Aleve, Ibuprofen, Motrin, Naproxen, Naprosyn and Aspirin based products such as Excedrin, Goodys Powder, BC Powder.  Stop ANY OVER THE COUNTER supplements until after surgery.  You may however, continue to take Tylenol if needed for pain up until the day of surgery.  No Alcohol for 24 hours before or after surgery.  No  Smoking including e-cigarettes for 24 hours prior to surgery.  No chewable tobacco products for at least 6 hours prior to surgery.  No nicotine patches on the day of surgery.  Do not use any "recreational" drugs for at least a week prior to your surgery.  Please be advised that the combination of cocaine and anesthesia may have negative outcomes, up to and including death. If you test positive for cocaine, your surgery will be cancelled.  On the morning of surgery brush your teeth with toothpaste and water, you may rinse your mouth with mouthwash if you wish. Do not swallow any toothpaste or mouthwash.  Use CHG Soap or wipes as directed on instruction sheet.  Do not wear jewelry, make-up, hairpins, clips or nail polish.  Do not wear lotions, powders, or perfumes.   Do not shave body from the neck down 48 hours prior to surgery just in case you cut yourself which could leave a site for infection. Also, freshly shaved skin may become irritated if using the CHG soap.  Contact lenses, hearing aids and dentures may not be worn into surgery.  Do not bring valuables to the hospital. [facility] is not responsible for any missing/lost belongings or valuables.   Notify your doctor if there is any change in your medical condition (cold, fever, infection).  Wear comfortable clothing (specific to your surgery type) to the hospital.  After surgery, you can help prevent lung complications by doing breathing exercises.  Take deep breaths and cough every 1-2 hours. Your doctor may order  a device called an Incentive Spirometer to help you take deep breaths. When coughing or sneezing, hold a pillow firmly against your incision with both hands. This is called "splinting." Doing this helps protect your incision. It also decreases belly discomfort.  If you are being admitted to the hospital overnight, leave your suitcase in the car. After surgery it may be brought to your room.  If you are being  discharged the day of surgery, you will not be allowed to drive home. You will need a responsible adult (18 years or older) to drive you home and stay with you that night.   If you are taking public transportation, you will need to have a responsible adult (18 years or older) with you. Please confirm with your physician that it is acceptable to use public transportation.   Please call the [facility] at [phone] if you have any questions about these instructions.  Surgery Visitation Policy:  Patients undergoing a surgery or procedure may have two family members or support persons with them as long as the person is not COVID-19 positive or experiencing its symptoms.   Inpatient Visitation:    Visiting hours are 7 a.m. to 8 p.m. Up to four visitors are allowed at one time in a patient room, including children. The visitors may rotate out with other people during the day. One designated support person (adult) may remain overnight.

## 2022-09-04 ENCOUNTER — Encounter: Payer: Self-pay | Admitting: Urgent Care

## 2022-09-04 ENCOUNTER — Encounter
Admission: RE | Admit: 2022-09-04 | Discharge: 2022-09-04 | Disposition: A | Discharge status: Home / Self Care | Payer: Medicare Other | Source: Ambulatory Visit | Attending: Obstetrics and Gynecology | Admitting: Obstetrics and Gynecology

## 2022-09-04 DIAGNOSIS — Z01818 Encounter for other preprocedural examination: Secondary | ICD-10-CM | POA: Diagnosis present

## 2022-09-04 DIAGNOSIS — Z01812 Encounter for preprocedural laboratory examination: Secondary | ICD-10-CM

## 2022-09-04 DIAGNOSIS — Z862 Personal history of diseases of the blood and blood-forming organs and certain disorders involving the immune mechanism: Secondary | ICD-10-CM | POA: Diagnosis not present

## 2022-09-04 LAB — CBC
HCT: 42.1 % (ref 36.0–46.0)
Hemoglobin: 13.6 g/dL (ref 12.0–15.0)
MCH: 28 pg (ref 26.0–34.0)
MCHC: 32.3 g/dL (ref 30.0–36.0)
MCV: 86.6 fL (ref 80.0–100.0)
Platelets: 279 10*3/uL (ref 150–400)
RBC: 4.86 MIL/uL (ref 3.87–5.11)
RDW: 13.8 % (ref 11.5–15.5)
WBC: 8 10*3/uL (ref 4.0–10.5)
nRBC: 0 % (ref 0.0–0.2)

## 2022-09-04 LAB — HEMOGLOBIN A1C
Hgb A1c MFr Bld: 9.8 % — ABNORMAL HIGH (ref 4.8–5.6)
Mean Plasma Glucose: 234.56 mg/dL

## 2022-09-04 LAB — BASIC METABOLIC PANEL
Anion gap: 7 (ref 5–15)
BUN: 13 mg/dL (ref 8–23)
CO2: 28 mmol/L (ref 22–32)
Calcium: 9.6 mg/dL (ref 8.9–10.3)
Chloride: 106 mmol/L (ref 98–111)
Creatinine, Ser: 0.57 mg/dL (ref 0.44–1.00)
GFR, Estimated: >60 mL/min (ref >60)
Glucose, Bld: 180 mg/dL — ABNORMAL HIGH (ref 70–99)
Potassium: 3.7 mmol/L (ref 3.5–5.1)
Sodium: 141 mmol/L (ref 135–145)

## 2022-09-04 LAB — TYPE AND SCREEN
ABO/RH(D): B POS
Antibody Screen: NEGATIVE

## 2022-09-04 NOTE — Progress Notes (Signed)
  Perioperative Services: Pre-Admission/Anesthesia Testing  Abnormal Lab Notification    Date: 09/04/22  Name: Autumn Ramos MRN:   [id number]  Re: Abnormal labs noted during PAT appointment   Provider(s) Notified: Schermerhorn, Gwen Her, * Notification mode: Routed and/or faxed via [facility] LAB VALUE(S): Lab Results  Component Value Date   GLUCOSE 180 (H) 09/04/2022   Lab Results  Component Value Date   HGBA1C 9.8 (H) 09/04/2022   Notes: Patient with a T2DM diagnosis. She is currently on both oral (Glucotrol, Janumet) and parenteral (Lantus, Novolin R) therapies.  In efforts to reduce risk of developing SSI, or other potential perioperative complications, this communication is being sent in order to determine if patient is deemed to have adequate medical optimization, including preoperative glycemic control.   With that being said, the benefit of improving glycemic control must be weighed against the overall risk associated with delaying a necessary elective surgery for this patient.   Honor Loh, MSN, APRN, FNP-C, CEN [facility]  Peri-operative Services Nurse Practitioner Phone: [phone] 09/04/22 2:50 PM

## 2022-09-08 ENCOUNTER — Other Ambulatory Visit: Payer: Medicare Other

## 2022-09-10 MED ORDER — LACTATED RINGERS IV SOLN: INTRAVENOUS | Status: DC

## 2022-09-10 MED ORDER — GABAPENTIN 300 MG PO CAPS: 300.0000 mg | ORAL_CAPSULE | ORAL | Status: AC

## 2022-09-10 MED ORDER — ORAL CARE MOUTH RINSE: 15.0000 mL | Freq: Once | OROMUCOSAL | Status: AC

## 2022-09-10 MED ORDER — CEFAZOLIN SODIUM-DEXTROSE 2-4 GM/100ML-% IV SOLN
2.0000 g | Freq: Once | INTRAVENOUS | Status: AC
  Administered 2022-09-11: 2 g via INTRAVENOUS

## 2022-09-10 MED ORDER — POVIDONE-IODINE 10 % EX SWAB: 2.0000 | Freq: Once | CUTANEOUS | Status: DC

## 2022-09-10 MED ORDER — ACETAMINOPHEN 500 MG PO TABS: 1000.0000 mg | ORAL_TABLET | ORAL | Status: AC

## 2022-09-10 MED ORDER — CHLORHEXIDINE GLUCONATE 0.12 % MT SOLN: 15.0000 mL | Freq: Once | OROMUCOSAL | Status: AC

## 2022-09-11 ENCOUNTER — Ambulatory Visit
Admission: RE | Admit: 2022-09-11 | Discharge: 2022-09-11 | Disposition: A | Discharge status: Home / Self Care | Payer: Medicare Other | Attending: Obstetrics and Gynecology | Admitting: Obstetrics and Gynecology

## 2022-09-11 ENCOUNTER — Ambulatory Visit: Payer: Medicare Other | Admitting: Anesthesiology

## 2022-09-11 ENCOUNTER — Encounter: Payer: Self-pay | Admitting: Obstetrics and Gynecology

## 2022-09-11 ENCOUNTER — Other Ambulatory Visit: Payer: Self-pay

## 2022-09-11 ENCOUNTER — Encounter
Admission: RE | Disposition: A | Discharge status: Home / Self Care | Payer: Self-pay | Source: Home / Self Care | Attending: Obstetrics and Gynecology

## 2022-09-11 DIAGNOSIS — Z7984 Long term (current) use of oral hypoglycemic drugs: Secondary | ICD-10-CM | POA: Insufficient documentation

## 2022-09-11 DIAGNOSIS — Z79899 Other long term (current) drug therapy: Secondary | ICD-10-CM | POA: Insufficient documentation

## 2022-09-11 DIAGNOSIS — N950 Postmenopausal bleeding: Secondary | ICD-10-CM | POA: Diagnosis present

## 2022-09-11 DIAGNOSIS — J449 Chronic obstructive pulmonary disease, unspecified: Secondary | ICD-10-CM | POA: Diagnosis not present

## 2022-09-11 DIAGNOSIS — Z01812 Encounter for preprocedural laboratory examination: Secondary | ICD-10-CM

## 2022-09-11 DIAGNOSIS — Z87891 Personal history of nicotine dependence: Secondary | ICD-10-CM | POA: Diagnosis not present

## 2022-09-11 DIAGNOSIS — I10 Essential (primary) hypertension: Secondary | ICD-10-CM | POA: Diagnosis not present

## 2022-09-11 DIAGNOSIS — Z79890 Hormone replacement therapy (postmenopausal): Secondary | ICD-10-CM | POA: Insufficient documentation

## 2022-09-11 DIAGNOSIS — K219 Gastro-esophageal reflux disease without esophagitis: Secondary | ICD-10-CM | POA: Diagnosis not present

## 2022-09-11 DIAGNOSIS — Z01818 Encounter for other preprocedural examination: Secondary | ICD-10-CM

## 2022-09-11 DIAGNOSIS — F419 Anxiety disorder, unspecified: Secondary | ICD-10-CM | POA: Insufficient documentation

## 2022-09-11 DIAGNOSIS — Z853 Personal history of malignant neoplasm of breast: Secondary | ICD-10-CM | POA: Insufficient documentation

## 2022-09-11 DIAGNOSIS — E119 Type 2 diabetes mellitus without complications: Secondary | ICD-10-CM | POA: Diagnosis not present

## 2022-09-11 DIAGNOSIS — E039 Hypothyroidism, unspecified: Secondary | ICD-10-CM | POA: Insufficient documentation

## 2022-09-11 DIAGNOSIS — F32A Depression, unspecified: Secondary | ICD-10-CM | POA: Insufficient documentation

## 2022-09-11 DIAGNOSIS — N840 Polyp of corpus uteri: Secondary | ICD-10-CM | POA: Insufficient documentation

## 2022-09-11 HISTORY — PX: DILATATION & CURETTAGE/HYSTEROSCOPY WITH MYOSURE: SHX6511

## 2022-09-11 LAB — ABO/RH: ABO/RH(D): B POS

## 2022-09-11 LAB — GLUCOSE, CAPILLARY
Glucose-Capillary: 284 mg/dL — ABNORMAL HIGH (ref 70–99)
Glucose-Capillary: 267 mg/dL — ABNORMAL HIGH (ref 70–99)
Glucose-Capillary: 279 mg/dL — ABNORMAL HIGH (ref 70–99)

## 2022-09-11 SURGERY — DILATATION & CURETTAGE/HYSTEROSCOPY WITH MYOSURE
Anesthesia: General | Site: Uterus

## 2022-09-11 MED ORDER — FENTANYL CITRATE (PF) 100 MCG/2ML IJ SOLN
INTRAMUSCULAR | Status: DC | PRN
  Administered 2022-09-11: 25 ug via INTRAVENOUS

## 2022-09-11 MED ORDER — SODIUM CHLORIDE 0.9 % IV SOLN: INTRAVENOUS | Status: DC

## 2022-09-11 MED ORDER — DEXAMETHASONE SODIUM PHOSPHATE 10 MG/ML IJ SOLN
INTRAMUSCULAR | Status: AC
  Filled 2022-09-11: qty 1

## 2022-09-11 MED ORDER — CHLORHEXIDINE GLUCONATE 0.12 % MT SOLN
OROMUCOSAL | Status: AC
  Administered 2022-09-11: 15 mL via OROMUCOSAL
  Filled 2022-09-11: qty 15

## 2022-09-11 MED ORDER — PROPOFOL 10 MG/ML IV BOLUS
INTRAVENOUS | Status: DC | PRN
  Administered 2022-09-11: 150 mg via INTRAVENOUS

## 2022-09-11 MED ORDER — FENTANYL CITRATE (PF) 100 MCG/2ML IJ SOLN
INTRAMUSCULAR | Status: AC
  Filled 2022-09-11: qty 2

## 2022-09-11 MED ORDER — LIDOCAINE HCL (CARDIAC) PF 100 MG/5ML IV SOSY
PREFILLED_SYRINGE | INTRAVENOUS | Status: DC | PRN
  Administered 2022-09-11: 100 mg via INTRAVENOUS

## 2022-09-11 MED ORDER — GABAPENTIN 300 MG PO CAPS
ORAL_CAPSULE | ORAL | Status: AC
  Administered 2022-09-11: 300 mg via ORAL
  Filled 2022-09-11: qty 1

## 2022-09-11 MED ORDER — HYDROMORPHONE HCL 1 MG/ML IJ SOLN
INTRAMUSCULAR | Status: AC
  Administered 2022-09-11: 0.25 mg via INTRAVENOUS
  Filled 2022-09-11: qty 1

## 2022-09-11 MED ORDER — KETOROLAC TROMETHAMINE 30 MG/ML IJ SOLN
INTRAMUSCULAR | Status: AC
  Filled 2022-09-11: qty 1

## 2022-09-11 MED ORDER — INSULIN ASPART 100 UNIT/ML IJ SOLN
5.0000 [IU] | Freq: Once | INTRAMUSCULAR | Status: AC
  Administered 2022-09-11: 5 [IU] via SUBCUTANEOUS

## 2022-09-11 MED ORDER — OXYCODONE HCL 5 MG/5ML PO SOLN: 5.0000 mg | Freq: Once | ORAL | Status: DC | PRN

## 2022-09-11 MED ORDER — INSULIN ASPART 100 UNIT/ML IJ SOLN
INTRAMUSCULAR | Status: AC
  Filled 2022-09-11: qty 1

## 2022-09-11 MED ORDER — INSULIN ASPART 100 UNIT/ML IJ SOLN
7.0000 [IU] | Freq: Once | INTRAMUSCULAR | Status: AC
  Administered 2022-09-11: 7 [IU] via SUBCUTANEOUS

## 2022-09-11 MED ORDER — KETOROLAC TROMETHAMINE 30 MG/ML IJ SOLN
INTRAMUSCULAR | Status: DC | PRN
  Administered 2022-09-11: 15 mg via INTRAVENOUS

## 2022-09-11 MED ORDER — ONDANSETRON HCL 4 MG/2ML IJ SOLN
INTRAMUSCULAR | Status: AC
  Filled 2022-09-11: qty 2

## 2022-09-11 MED ORDER — LIDOCAINE HCL (PF) 2 % IJ SOLN
INTRAMUSCULAR | Status: AC
  Filled 2022-09-11: qty 5

## 2022-09-11 MED ORDER — CEFAZOLIN SODIUM-DEXTROSE 2-4 GM/100ML-% IV SOLN
INTRAVENOUS | Status: AC
  Filled 2022-09-11: qty 100

## 2022-09-11 MED ORDER — HYDROMORPHONE HCL 1 MG/ML IJ SOLN
0.2500 mg | INTRAMUSCULAR | Status: DC | PRN
  Administered 2022-09-11: 0.25 mg via INTRAVENOUS

## 2022-09-11 MED ORDER — PROPOFOL 10 MG/ML IV BOLUS
INTRAVENOUS | Status: AC
  Filled 2022-09-11: qty 20

## 2022-09-11 MED ORDER — ACETAMINOPHEN 500 MG PO TABS
ORAL_TABLET | ORAL | Status: AC
  Administered 2022-09-11: 1000 mg via ORAL
  Filled 2022-09-11: qty 2

## 2022-09-11 MED ORDER — OXYCODONE HCL 5 MG PO TABS: 5.0000 mg | ORAL_TABLET | Freq: Once | ORAL | Status: DC | PRN

## 2022-09-11 MED ORDER — 0.9 % SODIUM CHLORIDE (POUR BTL) OPTIME
TOPICAL | Status: DC | PRN
  Administered 2022-09-11: 500 mL

## 2022-09-11 MED ORDER — ONDANSETRON HCL 4 MG/2ML IJ SOLN
INTRAMUSCULAR | Status: DC | PRN
  Administered 2022-09-11: 4 mg via INTRAVENOUS

## 2022-09-11 MED ORDER — DEXAMETHASONE SODIUM PHOSPHATE 10 MG/ML IJ SOLN
INTRAMUSCULAR | Status: DC | PRN
  Administered 2022-09-11: 10 mg via INTRAVENOUS

## 2022-09-11 SURGICAL SUPPLY — 26 items
BAG PRESSURE INF REUSE 1000 (BAG) ×1 IMPLANT
DEVICE MYOSURE LITE (MISCELLANEOUS) IMPLANT
DEVICE MYOSURE REACH (MISCELLANEOUS) IMPLANT
DRSG TELFA 3X8 NADH STRL (GAUZE/BANDAGES/DRESSINGS) ×1 IMPLANT
GLOVE SURG SYN 8.0 (GLOVE) ×1 IMPLANT
GLOVE SURG SYN 8.0 PF PI (GLOVE) ×1 IMPLANT
GOWN STRL REUS W/ TWL LRG LVL3 (GOWN DISPOSABLE) ×1 IMPLANT
GOWN STRL REUS W/ TWL XL LVL3 (GOWN DISPOSABLE) ×1 IMPLANT
GOWN STRL REUS W/TWL LRG LVL3 (GOWN DISPOSABLE) ×1
GOWN STRL REUS W/TWL XL LVL3 (GOWN DISPOSABLE) ×1
IV NS 1000ML (IV SOLUTION) ×1
IV NS 1000ML BAXH (IV SOLUTION) ×1 IMPLANT
IV NS IRRIG 3000ML ARTHROMATIC (IV SOLUTION) ×1 IMPLANT
KIT PROCEDURE FLUENT (KITS) IMPLANT
KIT TURNOVER CYSTO (KITS) ×1 IMPLANT
MANIFOLD NEPTUNE II (INSTRUMENTS) ×1 IMPLANT
PACK DNC HYST (MISCELLANEOUS) IMPLANT
PAD OB MATERNITY 4.3X12.25 (PERSONAL CARE ITEMS) ×1 IMPLANT
PAD PREP 24X41 OB/GYN DISP (PERSONAL CARE ITEMS) ×1 IMPLANT
SCRUB CHG 4% DYNA-HEX 4OZ (MISCELLANEOUS) ×1 IMPLANT
SEAL ROD LENS SCOPE MYOSURE (ABLATOR) ×1 IMPLANT
SET CYSTO W/LG BORE CLAMP LF (SET/KITS/TRAYS/PACK) IMPLANT
TOWEL OR 17X26 4PK STRL BLUE (TOWEL DISPOSABLE) ×1 IMPLANT
TRAP FLUID SMOKE EVACUATOR (MISCELLANEOUS) ×1 IMPLANT
TUBING CONNECTING 10 (TUBING) IMPLANT
WATER STERILE IRR 500ML POUR (IV SOLUTION) ×1 IMPLANT

## 2022-09-11 NOTE — Anesthesia Postprocedure Evaluation (Signed)
Anesthesia Post Note  Patient: Autumn Ramos  Procedure(s) Performed: FRACTIONAL DILATATION & CURETTAGE/HYSTEROSCOPY WITH MYOSURE, RESECTION OF POLYP (Uterus)  Patient location during evaluation: PACU Anesthesia Type: General Level of consciousness: awake and alert Pain management: pain level controlled Vital Signs Assessment: post-procedure vital signs reviewed and stable Respiratory status: spontaneous breathing, nonlabored ventilation, respiratory function stable and patient connected to nasal cannula oxygen Cardiovascular status: blood pressure returned to baseline and stable Postop Assessment: no apparent nausea or vomiting Anesthetic complications: no   No notable events documented.   Last Vitals:  Vitals:   09/11/22 1100 09/11/22 1111  BP: 108/61 (!) 103/56  Pulse: 62 60  Resp: 13 15  Temp: (!) 36.3 C (!) 36.1 C  SpO2: 93% 94%    Last Pain:  Vitals:   09/11/22 1111  TempSrc: Temporal  PainSc: 0-No pain                 Ilene Qua

## 2022-09-11 NOTE — Op Note (Signed)
Autumn Ramos, Autumn Ramos MEDICAL RECORD NO: [id number] ACCOUNT NO: [id number] DATE OF BIRTH: [birth year] FACILITY: [facility] LOCATION: [facility] PHYSICIAN: Boykin Nearing, MD  Operative Report   DATE OF PROCEDURE: 09/11/2022  PREOPERATIVE DIAGNOSES:   1.  Postmenopausal bleeding. 2.  Possible endometrial polyps.  POSTOPERATIVE DIAGNOSES:   1.  Postmenopausal bleeding. 2.  Multiple endometrial polyps.  PROCEDURE: 1.  Fractional dilation and curettage. 2.  Hysteroscopic resection of endometrial polyps with MyoSure.  SURGEON:  Boykin Nearing, MD  ANESTHESIA:  General endotracheal anesthesia.  INDICATIONS:  66 year old female with postmenopausal bleeding and a thickened endometrial stripe.  The patient underwent a saline infusion sonohysterography that revealed a thickened endometrial stripe.  DESCRIPTION OF PROCEDURE:  After adequate general endotracheal anesthesia, the patient was placed in dorsal supine position with the legs in the candy cane stirrups.  The patient's perineum and vagina were prepped and draped in normal sterile fashion.   Timeout was performed.  The patient did receive 2 grams of IV Ancef for surgical prophylaxis.  A straight catheterization of the bladder yielded 50 mL clear urine.  A weighted speculum was placed in the posterior vaginal vault and the anterior cervix was  grasped with a single tooth tenaculum.  An endocervical curettage was performed followed by dilation of the cervix #16 Hanks dilator.  The hysteroscope was advanced into the endometrial cavity, which revealed multiple endometrial polyps.  The MyoSure  was brought up to the operative field and resection of the polyps was performed.  Pictures were taken.  Good hemostasis was noted.  The MyoSure and hysteroscope were removed followed by dilation of the cervix to #20 Hanks dilator.  Endometrial curettage  was then performed with no additional tissue and good uterine cry.  Good hemostasis noted.   Procedure was terminated.  ESTIMATED BLOOD LOSS:  5 mL.  INTRAOPERATIVE FLUIDS:  300 mL  URINE OUTPUT:  50 mL.  The patient tolerated the procedure well and was taken to recovery room in good condition.   PUS D: 09/11/2022 10:31:51 am T: 09/11/2022 10:49:00 am  JOB: [id number]/ [id number]

## 2022-09-11 NOTE — Progress Notes (Signed)
Pt CBG: 279. Dr. Danne Baxter notified. Acknowledged. Orders received. See [facility]

## 2022-09-11 NOTE — Anesthesia Procedure Notes (Addendum)
Procedure Name: LMA Insertion Date/Time: 09/11/2022 9:51 AM  Performed by: Doreen Salvage, CRNAPre-anesthesia Checklist: Patient identified, Patient being monitored, Timeout performed, Emergency Drugs available and Suction available Patient Re-evaluated:Patient Re-evaluated prior to induction Oxygen Delivery Method: Circle system utilized Preoxygenation: Pre-oxygenation with 100% oxygen Induction Type: IV induction Ventilation: Mask ventilation without difficulty LMA: LMA inserted LMA Size: 3.0 Tube type: Oral Number of attempts: 1 Placement Confirmation: positive ETCO2 and breath sounds checked- equal and bilateral Tube secured with: Tape Dental Injury: Teeth and Oropharynx as per pre-operative assessment

## 2022-09-11 NOTE — Discharge Instructions (Signed)

## 2022-09-11 NOTE — Brief Op Note (Signed)
09/11/2022  10:18 AM  PATIENT:  Autumn Ramos  66 y.o. female  PRE-OPERATIVE DIAGNOSIS:  postmenopausal bleeding  POST-OPERATIVE DIAGNOSIS:  * No post-op diagnosis entered *  PROCEDURE:  Fractional dilation and curettage and hysteroscopic resection of endometrial polyps with Myosure  SURGEON:  Surgeon(s) and Role:    * Naileah Karg, Gwen Her, MD - Primary  PHYSICIAN ASSISTANT: CST   ASSISTANTS: none   ANESTHESIA:   general  EBL:  5 mL  IOF 300 cc , uo 50 cc  BLOOD ADMINISTERED:none  DRAINS: none   LOCAL MEDICATIONS USED:  NONE  SPECIMEN:  Source of Specimen:  ECC , endometrial curettings and endometrial polyps   DISPOSITION OF SPECIMEN:  PATHOLOGY  COUNTS:  YES  TOURNIQUET:  * No tourniquets in log *  DICTATION: .Other Dictation: Dictation Number verbal  PLAN OF CARE: Discharge to home after PACU  PATIENT DISPOSITION:  PACU - hemodynamically stable.   Delay start of Pharmacological VTE agent (>24hrs) due to surgical blood loss or risk of bleeding: not applicable

## 2022-09-11 NOTE — Transfer of Care (Signed)
Immediate Anesthesia Transfer of Care Note  Patient: Autumn Ramos  Procedure(s) Performed: FRACTIONAL DILATATION & CURETTAGE/HYSTEROSCOPY WITH MYOSURE, RESECTION OF POLYP (Uterus)  Patient Location: PACU  Anesthesia Type:General  Level of Consciousness: awake, alert , and oriented  Airway & Oxygen Therapy: Patient Spontanous Breathing  Post-op Assessment: Report given to RN and Post -op Vital signs reviewed and stable  Post vital signs: Reviewed and stable  Last Vitals:  Vitals Value Taken Time  BP 120/52 09/11/22 1030  Temp 36.3 C 09/11/22 1029  Pulse 81 09/11/22 1039  Resp 14 09/11/22 1039  SpO2 97 % 09/11/22 1039  Vitals shown include unvalidated device data.  Last Pain:  Vitals:   09/11/22 1038  TempSrc:   PainSc: 5          Complications: No notable events documented.

## 2022-09-11 NOTE — Anesthesia Preprocedure Evaluation (Addendum)
Anesthesia Evaluation  Patient identified by MRN, date of birth, ID band Patient awake    Reviewed: Allergy & Precautions, NPO status , Patient's Chart, lab work & pertinent test results  History of Anesthesia Complications (+) PROLONGED EMERGENCE and history of anesthetic complications  Airway Mallampati: III  TM Distance: >3 FB Neck ROM: full    Dental  (+) Chipped, Teeth Intact   Pulmonary COPD,  COPD inhaler, former smoker   Pulmonary exam normal        Cardiovascular hypertension, On Medications Normal cardiovascular exam     Neuro/Psych  Headaches PSYCHIATRIC DISORDERS Anxiety Depression       GI/Hepatic Neg liver ROS,GERD  Medicated,,  Endo/Other  diabetesHypothyroidism    Renal/GU      Musculoskeletal   Abdominal  (+) + obese  Peds  Hematology negative hematology ROS (+)   Anesthesia Other Findings Past Medical History: No date: Anemia No date: Anxiety No date: Breast cancer (HCC) No date: Complication of anesthesia     Comment:  difficult to wake up, very sensitive to medications No date: COPD (chronic obstructive pulmonary disease) (HCC) No date: Diabetes mellitus without complication (HCC) No date: Family history of adverse reaction to anesthesia     Comment:  mother difficult to wake up No date: GERD (gastroesophageal reflux disease) No date: H/O breast implant No date: Hypertension No date: Hypothyroidism 10/30/2004: Last menstrual period (LMP) > 10 days ago No date: Pneumonia  Past Surgical History: No date: BREAST ENHANCEMENT SURGERY No date: BREAST SURGERY No date: c sections x 2 No date: DILATION AND CURETTAGE OF UTERUS No date: MASTECTOMY No date: TONSILLECTOMY  BMI    Body Mass Index: 38.08 kg/m      Reproductive/Obstetrics negative OB ROS                             Anesthesia Physical Anesthesia Plan  ASA: 3  Anesthesia Plan: General LMA    Post-op Pain Management: Gabapentin PO (pre-op)*, Toradol IV (intra-op)* and Dilaudid IV   Induction: Intravenous  PONV Risk Score and Plan: 3 and Dexamethasone, Ondansetron, Midazolam and Treatment may vary due to age or medical condition  Airway Management Planned: LMA  Additional Equipment:   Intra-op Plan:   Post-operative Plan: Extubation in OR  Informed Consent: I have reviewed the patients History and Physical, chart, labs and discussed the procedure including the risks, benefits and alternatives for the proposed anesthesia with the patient or authorized representative who has indicated his/her understanding and acceptance.     Dental Advisory Given  Plan Discussed with: Anesthesiologist, CRNA and Surgeon  Anesthesia Plan Comments: (Patient consented for risks of anesthesia including but not limited to:  - adverse reactions to medications - damage to eyes, teeth, lips or other oral mucosa - nerve damage due to positioning  - sore throat or hoarseness - Damage to heart, brain, nerves, lungs, other parts of body or loss of life  Patient voiced understanding.)        Anesthesia Quick Evaluation

## 2022-09-11 NOTE — Progress Notes (Signed)
Pt scheduled for Fx D+C and possible myosure resection . Labs reviewed . Insulin 5 units given . No c/o  Ready to proceed

## 2022-09-12 ENCOUNTER — Encounter: Payer: Self-pay | Admitting: Obstetrics and Gynecology

## 2022-09-12 LAB — SURGICAL PATHOLOGY

## 2023-01-07 ENCOUNTER — Emergency Department: Payer: Medicare Other

## 2023-01-07 ENCOUNTER — Emergency Department
Admission: EM | Admit: 2023-01-07 | Discharge: 2023-01-07 | Disposition: A | Discharge status: Home / Self Care | Payer: Medicare Other | Attending: Emergency Medicine | Admitting: Emergency Medicine

## 2023-01-07 DIAGNOSIS — E119 Type 2 diabetes mellitus without complications: Secondary | ICD-10-CM | POA: Insufficient documentation

## 2023-01-07 DIAGNOSIS — M7918 Myalgia, other site: Secondary | ICD-10-CM | POA: Diagnosis not present

## 2023-01-07 DIAGNOSIS — S0990XA Unspecified injury of head, initial encounter: Secondary | ICD-10-CM | POA: Diagnosis present

## 2023-01-07 DIAGNOSIS — W19XXXA Unspecified fall, initial encounter: Secondary | ICD-10-CM

## 2023-01-07 DIAGNOSIS — W01198A Fall on same level from slipping, tripping and stumbling with subsequent striking against other object, initial encounter: Secondary | ICD-10-CM | POA: Diagnosis not present

## 2023-01-07 DIAGNOSIS — I10 Essential (primary) hypertension: Secondary | ICD-10-CM | POA: Insufficient documentation

## 2023-01-07 DIAGNOSIS — J449 Chronic obstructive pulmonary disease, unspecified: Secondary | ICD-10-CM | POA: Diagnosis not present

## 2023-01-07 MED ORDER — ACETAMINOPHEN 500 MG PO TABS
1000.0000 mg | ORAL_TABLET | Freq: Once | ORAL | Status: AC
  Administered 2023-01-07: 1000 mg via ORAL
  Filled 2023-01-07: qty 2

## 2023-01-07 MED ORDER — IBUPROFEN 600 MG PO TABS
600.0000 mg | ORAL_TABLET | Freq: Once | ORAL | Status: AC
  Administered 2023-01-07: 600 mg via ORAL
  Filled 2023-01-07: qty 1

## 2023-01-07 NOTE — ED Triage Notes (Signed)
Pt presents to the ED via POV due to a fall that happened yesterday. Pt states she was hanging something on a wall and the step ladder slipped from underneath her. Pt states she hit her head but denies LOC and blood thinners., Pt states she has a hx of a herniated disc and is being seen. Pt states she woke up this morning with L arm tingling and concerned about he neck.

## 2023-01-07 NOTE — ED Provider Notes (Signed)
[facility] Provider Note    Event Date/Time   First MD Initiated Contact with Patient 01/07/23 [phone]     (approximate)   History   Fall   HPI  Brad Falardeau is a 67 y.o. female   Past medical history of diabetes and COPD, hypertension hypothyroid who presents to the emergency department with a mechanical slip and fall.  She was hanging up something in her family members room when the stool slipped from underneath her and she fell backwards onto her buttocks hitting her left elbow and the back of her head.  No thinners or loss of consciousness.  This happened yesterday.  She has a history of a herniated disc in her C-spine and she had some left arm tingling in the pinky and ring finger.  She has pain in her tailbone.  She has been ambulatory.  She has no other acute medical complaints.   External Medical Documents Reviewed: Emergency department visit dated June 2022 for chest pain      Physical Exam   Triage Vital Signs: ED Triage Vitals  Enc Vitals Group     BP 01/07/23 0918 (!) 132/57     Pulse Rate 01/07/23 0918 74     Resp 01/07/23 0918 18     Temp 01/07/23 0918 97.7 F (36.5 C)     Temp Source 01/07/23 0918 Oral     SpO2 01/07/23 0918 95 %     Weight 01/07/23 0919 195 lb 1.7 oz (88.5 kg)     Height 01/07/23 0919 5' (1.524 m)     Head Circumference --      Peak Flow --      Pain Score 01/07/23 0918 0     Pain Loc --      Pain Edu? --      Excl. in [place]? --     Most recent vital signs: Vitals:   01/07/23 0918  BP: (!) 132/57  Pulse: 74  Resp: 18  Temp: 97.7 F (36.5 C)  SpO2: 95%    General: Awake, no distress.  CV:  Good peripheral perfusion.  Resp:  Normal effort.  Abd:  No distention.  Other:  Awake alert comfortable with normal vital signs, no signs of head trauma, neck supple with full range of motion, full range of motion neurovascular intact to all extremities except for some mild paresthesia to the ulnar  distribution from the elbow downward on the left upper extremity.  She has some sacral/coccyx tenderness to palpation.   ED Results / Procedures / Treatments   Labs (all labs ordered are listed, but only abnormal results are displayed) Labs Reviewed - No data to display  RADIOLOGY I independently reviewed and interpreted CT of the head and see no obvious bleeding or midline shift   PROCEDURES:  Critical Care performed: No  Procedures   MEDICATIONS ORDERED IN ED: Medications  acetaminophen (TYLENOL) tablet 1,000 mg (1,000 mg Oral Given 01/07/23 1040)  ibuprofen (ADVIL) tablet 600 mg (600 mg Oral Given 01/07/23 1040)     IMPRESSION / MDM / ASSESSMENT AND PLAN / ED COURSE  I reviewed the triage vital signs and the nursing notes.                                Patient's presentation is most consistent with acute presentation with potential threat to life or bodily function.  Differential diagnosis includes, but is  not limited to, blunt traumatic injury including intracranial bleeding, C-spine fracture dislocation, sacral/coccyx/pelvic fracture dislocation, peripheral neuropathy due to injury at the elbow ulnar nerve   The patient is on the cardiac monitor to evaluate for evidence of arrhythmia and/or significant heart rate changes.  MDM: Patient with imaging that is unremarkable for emergent traumatic injuries.  She appears well.  I expect that the paresthesia to the left arm occurred with the elbow injury, mild, full range of motion no bony tenderness doubt fracture in the area.  She will follow-up with her PMD.        FINAL CLINICAL IMPRESSION(S) / ED DIAGNOSES   Final diagnoses:  Fall, initial encounter  Minor head injury, initial encounter  Buttock pain     Rx / DC Orders   ED Discharge Orders     None        Note:  This document was prepared using Dragon voice recognition software and may include unintentional dictation errors.    Lucillie Garfinkel,  MD 01/07/23 [phone]

## 2023-01-07 NOTE — Discharge Instructions (Signed)
Take acetaminophen 650 mg and ibuprofen 400 mg every 6 hours for pain.  Take with food.  Thank you for choosing us for your health care today!  Please see your primary doctor this week for a follow up appointment.   Sometimes, in the early stages of certain disease courses it is difficult to detect in the emergency department evaluation -- so, it is important that you continue to monitor your symptoms and call your doctor right away or return to the emergency department if you develop any new or worsening symptoms.  Please go to the following website to schedule new (and existing) patient appointments:   [email or web address]  If you do not have a primary doctor try calling the following clinics to establish care:  If you have insurance:  [facility] [phone] [address] [place] [address]   [facility]  [phone] [address] [place] [address] [place] [address]   If you do not have insurance:  [facility]  [phone] [address] [place] [address]   The following is another list of primary care offices in the area who are accepting new patients at this time.  Please reach out to one of them directly and let them know you would like to schedule an appointment to follow up on an Emergency Department visit, and/or to establish a new primary care provider (PCP).  There are likely other primary care clinics in the are who are accepting new patients, but this is an excellent place to start:  [facility] Lead physician: Dr Angela Bacigalupo [address] [place], [place] [address] [phone]  [facility] Lead Physician: Dr Krichna Sowles [address], [place], [place] [address] [phone]  [facility]  Lead Physician: Dr Megan Johnson [address], [place], [place] [address] [phone]  [facility] [place] [facility] Lead Physician: Dr Alex  Karamalegos [address], [place], [place] [address] [phone]  [facility] [place] Lead Physician: Dr Laura Berglund [address], [place], [place] [address] [phone]   It was my pleasure to care for you today.   Mathayus Stanbery S. Demyan Fugate, MD  

## 2023-01-30 ENCOUNTER — Emergency Department: Payer: Medicare Other

## 2023-01-30 ENCOUNTER — Inpatient Hospital Stay
Admission: EM | Admit: 2023-01-30 | Discharge: 2023-02-01 | DRG: 192 | Disposition: A | Discharge status: Home / Self Care | Payer: Medicare Other | Attending: Internal Medicine | Admitting: Internal Medicine

## 2023-01-30 ENCOUNTER — Other Ambulatory Visit: Payer: Self-pay

## 2023-01-30 DIAGNOSIS — F329 Major depressive disorder, single episode, unspecified: Secondary | ICD-10-CM | POA: Diagnosis present

## 2023-01-30 DIAGNOSIS — E039 Hypothyroidism, unspecified: Secondary | ICD-10-CM | POA: Diagnosis present

## 2023-01-30 DIAGNOSIS — R0602 Shortness of breath: Secondary | ICD-10-CM | POA: Diagnosis not present

## 2023-01-30 DIAGNOSIS — Z87891 Personal history of nicotine dependence: Secondary | ICD-10-CM

## 2023-01-30 DIAGNOSIS — F1190 Opioid use, unspecified, uncomplicated: Secondary | ICD-10-CM | POA: Diagnosis present

## 2023-01-30 DIAGNOSIS — Z6838 Body mass index (BMI) 38.0-38.9, adult: Secondary | ICD-10-CM

## 2023-01-30 DIAGNOSIS — G8929 Other chronic pain: Secondary | ICD-10-CM | POA: Diagnosis present

## 2023-01-30 DIAGNOSIS — K219 Gastro-esophageal reflux disease without esophagitis: Secondary | ICD-10-CM | POA: Diagnosis present

## 2023-01-30 DIAGNOSIS — Z1152 Encounter for screening for COVID-19: Secondary | ICD-10-CM

## 2023-01-30 DIAGNOSIS — Z8249 Family history of ischemic heart disease and other diseases of the circulatory system: Secondary | ICD-10-CM

## 2023-01-30 DIAGNOSIS — E1165 Type 2 diabetes mellitus with hyperglycemia: Secondary | ICD-10-CM

## 2023-01-30 DIAGNOSIS — C50512 Malignant neoplasm of lower-outer quadrant of left female breast: Secondary | ICD-10-CM | POA: Diagnosis present

## 2023-01-30 DIAGNOSIS — I4510 Unspecified right bundle-branch block: Secondary | ICD-10-CM | POA: Diagnosis present

## 2023-01-30 DIAGNOSIS — Z7951 Long term (current) use of inhaled steroids: Secondary | ICD-10-CM

## 2023-01-30 DIAGNOSIS — E785 Hyperlipidemia, unspecified: Secondary | ICD-10-CM | POA: Diagnosis present

## 2023-01-30 DIAGNOSIS — J440 Chronic obstructive pulmonary disease with acute lower respiratory infection: Secondary | ICD-10-CM | POA: Diagnosis present

## 2023-01-30 DIAGNOSIS — E669 Obesity, unspecified: Secondary | ICD-10-CM | POA: Diagnosis present

## 2023-01-30 DIAGNOSIS — Z7984 Long term (current) use of oral hypoglycemic drugs: Secondary | ICD-10-CM

## 2023-01-30 DIAGNOSIS — Z7982 Long term (current) use of aspirin: Secondary | ICD-10-CM

## 2023-01-30 DIAGNOSIS — E1140 Type 2 diabetes mellitus with diabetic neuropathy, unspecified: Secondary | ICD-10-CM | POA: Diagnosis present

## 2023-01-30 DIAGNOSIS — Z853 Personal history of malignant neoplasm of breast: Secondary | ICD-10-CM

## 2023-01-30 DIAGNOSIS — Z79899 Other long term (current) drug therapy: Secondary | ICD-10-CM

## 2023-01-30 DIAGNOSIS — J449 Chronic obstructive pulmonary disease, unspecified: Secondary | ICD-10-CM | POA: Diagnosis present

## 2023-01-30 DIAGNOSIS — Z888 Allergy status to other drugs, medicaments and biological substances status: Secondary | ICD-10-CM

## 2023-01-30 DIAGNOSIS — Z79891 Long term (current) use of opiate analgesic: Secondary | ICD-10-CM

## 2023-01-30 DIAGNOSIS — J441 Chronic obstructive pulmonary disease with (acute) exacerbation: Principal | ICD-10-CM | POA: Diagnosis present

## 2023-01-30 DIAGNOSIS — Z794 Long term (current) use of insulin: Secondary | ICD-10-CM

## 2023-01-30 DIAGNOSIS — Z885 Allergy status to narcotic agent status: Secondary | ICD-10-CM

## 2023-01-30 DIAGNOSIS — I10 Essential (primary) hypertension: Secondary | ICD-10-CM | POA: Diagnosis present

## 2023-01-30 DIAGNOSIS — R0902 Hypoxemia: Secondary | ICD-10-CM | POA: Diagnosis present

## 2023-01-30 DIAGNOSIS — J209 Acute bronchitis, unspecified: Secondary | ICD-10-CM | POA: Diagnosis present

## 2023-01-30 DIAGNOSIS — Z833 Family history of diabetes mellitus: Secondary | ICD-10-CM

## 2023-01-30 DIAGNOSIS — Z79890 Hormone replacement therapy (postmenopausal): Secondary | ICD-10-CM

## 2023-01-30 LAB — CBC
HCT: 42.3 % (ref 36.0–46.0)
Hemoglobin: 13.8 g/dL (ref 12.0–15.0)
MCH: 28.9 pg (ref 26.0–34.0)
MCHC: 32.6 g/dL (ref 30.0–36.0)
MCV: 88.7 fL (ref 80.0–100.0)
Platelets: 248 10*3/uL (ref 150–400)
RBC: 4.77 MIL/uL (ref 3.87–5.11)
RDW: 13.4 % (ref 11.5–15.5)
WBC: 8.4 10*3/uL (ref 4.0–10.5)
nRBC: 0 % (ref 0.0–0.2)

## 2023-01-30 LAB — COMPREHENSIVE METABOLIC PANEL
ALT: 30 U/L (ref 0–44)
AST: 23 U/L (ref 15–41)
Albumin: 3.5 g/dL (ref 3.5–5.0)
Alkaline Phosphatase: 80 U/L (ref 38–126)
Anion gap: 9 (ref 5–15)
BUN: 12 mg/dL (ref 8–23)
CO2: 27 mmol/L (ref 22–32)
Calcium: 8.8 mg/dL — ABNORMAL LOW (ref 8.9–10.3)
Chloride: 99 mmol/L (ref 98–111)
Creatinine, Ser: 0.59 mg/dL (ref 0.44–1.00)
GFR, Estimated: >60 mL/min (ref >60)
Glucose, Bld: 442 mg/dL — ABNORMAL HIGH (ref 70–99)
Potassium: 3.5 mmol/L (ref 3.5–5.1)
Sodium: 135 mmol/L (ref 135–145)
Total Bilirubin: 0.6 mg/dL (ref 0.3–1.2)
Total Protein: 6.4 g/dL — ABNORMAL LOW (ref 6.5–8.1)

## 2023-01-30 LAB — RESP PANEL BY RT-PCR (RSV, FLU A&B, COVID)  RVPGX2
Influenza A by PCR: NEGATIVE
Influenza B by PCR: NEGATIVE
Resp Syncytial Virus by PCR: NEGATIVE
SARS Coronavirus 2 by RT PCR: NEGATIVE

## 2023-01-30 LAB — GLUCOSE, CAPILLARY: Glucose-Capillary: 374 mg/dL — ABNORMAL HIGH (ref 70–99)

## 2023-01-30 LAB — CBG MONITORING, ED: Glucose-Capillary: 296 mg/dL — ABNORMAL HIGH (ref 70–99)

## 2023-01-30 MED ORDER — PANTOPRAZOLE SODIUM 40 MG PO TBEC
40.0000 mg | DELAYED_RELEASE_TABLET | Freq: Every day | ORAL | Status: DC
  Administered 2023-01-30 – 2023-02-01 (×3): 40 mg via ORAL
  Filled 2023-01-30 (×3): qty 1

## 2023-01-30 MED ORDER — METHYLPREDNISOLONE SODIUM SUCC 40 MG IJ SOLR
40.0000 mg | INTRAMUSCULAR | Status: AC
  Administered 2023-01-30: 40 mg via INTRAVENOUS
  Filled 2023-01-30: qty 1

## 2023-01-30 MED ORDER — PREDNISONE 20 MG PO TABS: 40.0000 mg | ORAL_TABLET | Freq: Every day | ORAL | Status: DC

## 2023-01-30 MED ORDER — INSULIN ASPART 100 UNIT/ML IJ SOLN
0.0000 [IU] | Freq: Every day | INTRAMUSCULAR | Status: DC
  Administered 2023-01-30 – 2023-01-31 (×2): 5 [IU] via SUBCUTANEOUS
  Filled 2023-01-30 (×2): qty 1

## 2023-01-30 MED ORDER — IPRATROPIUM-ALBUTEROL 0.5-2.5 (3) MG/3ML IN SOLN
3.0000 mL | Freq: Once | RESPIRATORY_TRACT | Status: AC
  Administered 2023-01-30: 3 mL via RESPIRATORY_TRACT
  Filled 2023-01-30: qty 3

## 2023-01-30 MED ORDER — SODIUM CHLORIDE 0.9 % IV SOLN: INTRAVENOUS | Status: DC

## 2023-01-30 MED ORDER — INSULIN ASPART 100 UNIT/ML IJ SOLN
0.0000 [IU] | Freq: Three times a day (TID) | INTRAMUSCULAR | Status: DC
  Administered 2023-01-30: 11 [IU] via SUBCUTANEOUS
  Administered 2023-01-31 – 2023-02-01 (×4): 15 [IU] via SUBCUTANEOUS
  Filled 2023-01-30 (×5): qty 1

## 2023-01-30 MED ORDER — GLIPIZIDE 10 MG PO TABS
10.0000 mg | ORAL_TABLET | Freq: Every day | ORAL | Status: DC
  Administered 2023-01-31: 10 mg via ORAL
  Filled 2023-01-30: qty 1

## 2023-01-30 MED ORDER — IPRATROPIUM-ALBUTEROL 0.5-2.5 (3) MG/3ML IN SOLN: 3.0000 mL | Freq: Once | RESPIRATORY_TRACT | Status: DC

## 2023-01-30 MED ORDER — INSULIN ASPART 100 UNIT/ML IJ SOLN
6.0000 [IU] | Freq: Three times a day (TID) | INTRAMUSCULAR | Status: DC
  Administered 2023-01-30 – 2023-02-01 (×5): 6 [IU] via SUBCUTANEOUS
  Filled 2023-01-30 (×5): qty 1

## 2023-01-30 MED ORDER — ONDANSETRON HCL 4 MG PO TABS: 4.0000 mg | ORAL_TABLET | Freq: Four times a day (QID) | ORAL | Status: DC | PRN

## 2023-01-30 MED ORDER — INSULIN ASPART 100 UNIT/ML IJ SOLN
10.0000 [IU] | Freq: Once | INTRAMUSCULAR | Status: AC
  Administered 2023-01-30: 10 [IU] via SUBCUTANEOUS
  Filled 2023-01-30: qty 1

## 2023-01-30 MED ORDER — SODIUM CHLORIDE 0.9 % IV SOLN
2.0000 g | Freq: Once | INTRAVENOUS | Status: AC
  Administered 2023-01-30: 2 g via INTRAVENOUS
  Filled 2023-01-30: qty 20

## 2023-01-30 MED ORDER — ONDANSETRON HCL 4 MG/2ML IJ SOLN: 4.0000 mg | Freq: Four times a day (QID) | INTRAMUSCULAR | Status: DC | PRN

## 2023-01-30 MED ORDER — AZITHROMYCIN 500 MG PO TABS
500.0000 mg | ORAL_TABLET | Freq: Every day | ORAL | Status: AC
  Administered 2023-01-30 – 2023-02-01 (×3): 500 mg via ORAL
  Filled 2023-01-30 (×3): qty 1

## 2023-01-30 MED ORDER — EZETIMIBE 10 MG PO TABS
10.0000 mg | ORAL_TABLET | Freq: Every day | ORAL | Status: DC
  Administered 2023-01-31 – 2023-02-01 (×2): 10 mg via ORAL
  Filled 2023-01-30 (×2): qty 1

## 2023-01-30 MED ORDER — MONTELUKAST SODIUM 10 MG PO TABS
10.0000 mg | ORAL_TABLET | Freq: Every day | ORAL | Status: DC
  Administered 2023-01-30 – 2023-01-31 (×2): 10 mg via ORAL
  Filled 2023-01-30 (×2): qty 1

## 2023-01-30 MED ORDER — HYDROCOD POLI-CHLORPHE POLI ER 10-8 MG/5ML PO SUER
5.0000 mL | Freq: Two times a day (BID) | ORAL | Status: DC | PRN
  Administered 2023-02-01: 5 mL via ORAL
  Filled 2023-01-30: qty 5

## 2023-01-30 MED ORDER — PHENOL 1.4 % MT LIQD
1.0000 | OROMUCOSAL | Status: DC | PRN
  Administered 2023-01-31: 1 via OROMUCOSAL
  Filled 2023-01-30: qty 177

## 2023-01-30 MED ORDER — INSULIN GLARGINE-YFGN 100 UNIT/ML ~~LOC~~ SOLN
20.0000 [IU] | Freq: Every day | SUBCUTANEOUS | Status: DC
  Administered 2023-01-30 – 2023-01-31 (×2): 20 [IU] via SUBCUTANEOUS
  Filled 2023-01-30 (×2): qty 0.2

## 2023-01-30 MED ORDER — GABAPENTIN 300 MG PO CAPS
600.0000 mg | ORAL_CAPSULE | Freq: Every day | ORAL | Status: DC
  Administered 2023-01-30 – 2023-01-31 (×2): 600 mg via ORAL
  Filled 2023-01-30 (×2): qty 2

## 2023-01-30 MED ORDER — HYDROCODONE BIT-HOMATROP MBR 5-1.5 MG/5ML PO SOLN: 5.0000 mL | Freq: Four times a day (QID) | ORAL | Status: DC | PRN

## 2023-01-30 MED ORDER — DEXTROMETHORPHAN POLISTIREX ER 30 MG/5ML PO SUER
30.0000 mg | Freq: Two times a day (BID) | ORAL | Status: DC
  Administered 2023-01-30 – 2023-02-01 (×4): 30 mg via ORAL
  Filled 2023-01-30 (×4): qty 5

## 2023-01-30 MED ORDER — ENOXAPARIN SODIUM 40 MG/0.4ML IJ SOSY
40.0000 mg | PREFILLED_SYRINGE | INTRAMUSCULAR | Status: DC
  Administered 2023-01-30 – 2023-01-31 (×2): 40 mg via SUBCUTANEOUS
  Filled 2023-01-30 (×2): qty 0.4

## 2023-01-30 MED ORDER — ACETAMINOPHEN 650 MG RE SUPP: 650.0000 mg | Freq: Four times a day (QID) | RECTAL | Status: DC | PRN

## 2023-01-30 MED ORDER — GUAIFENESIN ER 600 MG PO TB12
1200.0000 mg | ORAL_TABLET | Freq: Two times a day (BID) | ORAL | Status: DC
  Administered 2023-01-30 – 2023-02-01 (×4): 1200 mg via ORAL
  Filled 2023-01-30 (×4): qty 2

## 2023-01-30 MED ORDER — IPRATROPIUM-ALBUTEROL 0.5-2.5 (3) MG/3ML IN SOLN
3.0000 mL | Freq: Four times a day (QID) | RESPIRATORY_TRACT | Status: DC
  Administered 2023-01-30 – 2023-02-01 (×7): 3 mL via RESPIRATORY_TRACT
  Filled 2023-01-30 (×7): qty 3

## 2023-01-30 MED ORDER — LEVOTHYROXINE SODIUM 100 MCG PO TABS
100.0000 ug | ORAL_TABLET | Freq: Every day | ORAL | Status: DC
  Administered 2023-01-31 – 2023-02-01 (×2): 100 ug via ORAL
  Filled 2023-01-30 (×2): qty 1

## 2023-01-30 MED ORDER — SODIUM CHLORIDE 0.9 % IV SOLN
1.0000 g | INTRAVENOUS | Status: DC
  Administered 2023-01-31: 1 g via INTRAVENOUS
  Filled 2023-01-30: qty 1
  Filled 2023-01-30: qty 10

## 2023-01-30 MED ORDER — METOPROLOL SUCCINATE ER 25 MG PO TB24
25.0000 mg | ORAL_TABLET | Freq: Every day | ORAL | Status: DC
  Administered 2023-01-30 – 2023-02-01 (×3): 25 mg via ORAL
  Filled 2023-01-30 (×3): qty 1

## 2023-01-30 MED ORDER — LISINOPRIL 10 MG PO TABS
10.0000 mg | ORAL_TABLET | Freq: Every day | ORAL | Status: DC
  Administered 2023-01-30 – 2023-02-01 (×3): 10 mg via ORAL
  Filled 2023-01-30 (×3): qty 1

## 2023-01-30 MED ORDER — BENZONATATE 100 MG PO CAPS
100.0000 mg | ORAL_CAPSULE | Freq: Three times a day (TID) | ORAL | Status: DC
  Administered 2023-01-30 – 2023-01-31 (×2): 100 mg via ORAL
  Filled 2023-01-30 (×2): qty 1

## 2023-01-30 MED ORDER — PROGESTERONE MICRONIZED 100 MG PO CAPS
100.0000 mg | ORAL_CAPSULE | Freq: Every day | ORAL | Status: DC
  Administered 2023-01-30 – 2023-02-01 (×3): 100 mg via ORAL
  Filled 2023-01-30 (×3): qty 1

## 2023-01-30 MED ORDER — ATORVASTATIN CALCIUM 20 MG PO TABS
80.0000 mg | ORAL_TABLET | Freq: Every day | ORAL | Status: DC
  Administered 2023-01-30 – 2023-01-31 (×2): 80 mg via ORAL
  Filled 2023-01-30 (×2): qty 4

## 2023-01-30 MED ORDER — HYDROCODONE-ACETAMINOPHEN 5-325 MG PO TABS
1.0000 | ORAL_TABLET | Freq: Four times a day (QID) | ORAL | Status: DC | PRN
  Administered 2023-01-31: 1 via ORAL
  Filled 2023-01-30: qty 1

## 2023-01-30 MED ORDER — ACETAMINOPHEN 325 MG PO TABS: 650.0000 mg | ORAL_TABLET | Freq: Four times a day (QID) | ORAL | Status: DC | PRN

## 2023-01-30 MED ORDER — SENNOSIDES-DOCUSATE SODIUM 8.6-50 MG PO TABS: 1.0000 | ORAL_TABLET | Freq: Every evening | ORAL | Status: DC | PRN

## 2023-01-30 NOTE — ED Triage Notes (Signed)
Pt here with SOB. Pt states she was dx with bronchitis and a sinus infx yesterday. Pt has hx of COPD. Pt states she cannot walk today with losing her breath. Pt states she hurts everywhere but especially in her chest. Pt states she has a productive cough.

## 2023-01-30 NOTE — ED Provider Notes (Signed)
[facility] Provider Note  Patient Contact: 4:30 PM (approximate)   History   Shortness of Breath   HPI  Autumn Ramos is a 67 y.o. female with history of asthma/COPD on Singulair, Dupixent, Trelegy for controller medications, has albuterol inhaler for bronchospasm symptoms.  Patient presents to the emergency department with exertional shortness of breath that seems to be worsening.  Patient was seen by her PCP on 01/25/2023 and was prescribed Augmentin and prednisone with no improvement in her symptoms.     Physical Exam   Triage Vital Signs: ED Triage Vitals  Enc Vitals Group     BP 01/30/23 1319 (!) 161/83     Pulse Rate 01/30/23 1319 90     Resp 01/30/23 1319 20     Temp 01/30/23 1319 98.2 F (36.8 C)     Temp Source 01/30/23 1319 Oral     SpO2 01/30/23 1319 93 %     Weight 01/30/23 1320 195 lb 1.7 oz (88.5 kg)     Height 01/30/23 1320 5' (1.524 m)     Head Circumference --      Peak Flow --      Pain Score 01/30/23 1320 8     Pain Loc --      Pain Edu? --      Excl. in [place]? --     Most recent vital signs: Vitals:   01/30/23 1809 01/30/23 1817  BP: (!) 162/65   Pulse: (!) 108   Resp: (!) 22   Temp:    SpO2: 91% (!) 89%     General: Alert and in no acute distress. Eyes:  PERRL. EOMI. Head: No acute traumatic findings ENT:      Nose: No congestion/rhinnorhea.      Mouth/Throat: Mucous membranes are moist. Neck: No stridor. No cervical spine tenderness to palpation. Cardiovascular:  Good peripheral perfusion Respiratory: Normal respiratory effort without tachypnea or retractions. Lungs CTAB. Good air entry to the bases with no decreased or absent breath sounds. Gastrointestinal: Bowel sounds 4 quadrants. Soft and nontender to palpation. No guarding or rigidity. No palpable masses. No distention. No CVA tenderness. Musculoskeletal: Full range of motion to all extremities.  Neurologic:  No gross focal neurologic deficits are  appreciated.  Skin:   No rash noted   ED Results / Procedures / Treatments   Labs (all labs ordered are listed, but only abnormal results are displayed) Labs Reviewed  COMPREHENSIVE METABOLIC PANEL - Abnormal; Notable for the following components:      Result Value   Glucose, Bld 442 (*)    Calcium 8.8 (*)    Total Protein 6.4 (*)    All other components within normal limits  RESP PANEL BY RT-PCR (RSV, FLU A&B, COVID)  RVPGX2  CBC        RADIOLOGY  I personally viewed and evaluated these images as part of my medical decision making, as well as reviewing the written report by the radiologist.  ED Provider Interpretation: No acute abnormality on chest x-ray.   PROCEDURES:  Critical Care performed: No  Procedures   MEDICATIONS ORDERED IN ED: Medications  cefTRIAXone (ROCEPHIN) 2 g in sodium chloride 0.9 % 100 mL IVPB (0 g Intravenous Stopped 01/30/23 1809)  insulin aspart (novoLOG) injection 10 Units (10 Units Subcutaneous Given 01/30/23 1730)  ipratropium-albuterol (DUONEB) 0.5-2.5 (3) MG/3ML nebulizer solution 3 mL (3 mLs Nebulization Given 01/30/23 1730)  ipratropium-albuterol (DUONEB) 0.5-2.5 (3) MG/3ML nebulizer solution 3 mL (3 mLs Nebulization  Given 01/30/23 1730)  ipratropium-albuterol (DUONEB) 0.5-2.5 (3) MG/3ML nebulizer solution 3 mL (3 mLs Nebulization Given 01/30/23 1729)     IMPRESSION / MDM / ASSESSMENT AND PLAN / ED COURSE  I reviewed the triage vital signs and the nursing notes.                              Assessment and plan:  COPD exacerbation 67 year old female presents to the emergency department with worsening shortness of breath, cough productive for purulent sputum production and generalized malaise despite taking prednisone and Augmentin at home.  Patient was tachypneic throughout emergency department course and O2 sats dropped to 89% after 3 DuoNebs given in the emergency department.  On exam, patient was alert but did seem subdued and fatigued.   She had no increased work of breathing at rest but did with ambulation.  She did have faint expiratory wheezing in the lung bases bilaterally.  CBC, CMP reassuring.  COVID, influenza and RSV testing were negative.  No acute abnormality on chest x-ray.  Patient was given 2 g of Rocephin while in the emergency department and subcu insulin given blood glucose of 442 on CMP.  Patient was accepted for admission to the hospitalist service under the care of Dr. Berle Mull.    Clinical Course as of 01/30/23 1853  Tue Jan 30, 2023  1513 DG Chest 2 View [KP]    Clinical Course User Index [KP] Harvest Dark, MD     FINAL CLINICAL IMPRESSION(S) / ED DIAGNOSES   Final diagnoses:  COPD exacerbation     Rx / DC Orders   ED Discharge Orders     None        Note:  This document was prepared using Dragon voice recognition software and may include unintentional dictation errors.   Vallarie Mare [place], PA-C 01/30/23 1853    Harvest Dark, MD 01/30/23 2216

## 2023-01-30 NOTE — H&P (Signed)
History and Physical  Patient: Autumn Ramos [id number] DOB: [birth year] DOA: 01/30/2023 DOS: the patient was seen and examined on 01/30/2023 Patient coming from: Home  Chief Complaint: Worsening cough and shortness of breath. HPI: Autumn Ramos is a 67 y.o. female with PMH significant of COPD, HLD, HTN, type II DM, GERD, hypothyroidism, chronic pain. Presented to hospital with complaints of cough and shortness of breath. Symptoms are ongoing since last 1 week. Went to see outpatient walk-in clinic on 3/28. Patient was prescribed Augmentin and prednisone 20 mg. Patient completed her course and originally had some improvement in her sputum color-turn clear from yellow but have recurrence of the symptoms again on 4/2. Has been feeling fatigue and tired and has not been able to ambulate without any shortness of breath or dizziness since last 3 to 4 days. With 20 mg prednisone her blood sugars have been ranging in 400-500 range. Denies any nausea or vomiting.  No fever no chills.  No diarrhea no constipation. Remains compliant with all her medication.  Does not smoke right now but had a history and quit in 2015.  Review of Systems: As mentioned in the history of present illness. All other systems reviewed and are negative.  Prior to Admission medications   Medication Sig Start Date End Date Taking? Authorizing Provider  albuterol (PROVENTIL HFA;VENTOLIN HFA) 108 (90 Base) MCG/ACT inhaler Inhale 1-2 puffs into the lungs every 4 (four) hours as needed for wheezing or shortness of breath.   Yes [provider]  ALPRAZolam Autumn Ramos) 0.25 MG tablet as needed. For procedure 02/08/21  Yes [provider]  amoxicillin-clavulanate (AUGMENTIN) 875-125 MG tablet Take 1 tablet by mouth 2 (two) times daily. 01/25/23 02/01/23 Yes [provider]  atorvastatin (LIPITOR) 80 MG tablet Take 80 mg by mouth at bedtime.   Yes [provider]  Cholecalciferol (VITAMIN D3) 50 MCG  (2000 UT) capsule Take 2,000 Units by mouth daily. 11/30/21  Yes [provider]  EPINEPHrine 0.3 mg/0.3 mL IJ SOAJ injection epinephrine 0.3 mg/0.3 mL injection, auto-injector   Yes [provider]  ezetimibe (ZETIA) 10 MG tablet Take 10 mg by mouth daily. 12/17/19  Yes [provider]  gabapentin (NEURONTIN) 300 MG capsule Take 2 capsules by mouth at bedtime. 02/28/20  Yes [provider]  glipiZIDE (GLUCOTROL) 10 MG tablet Take 10 mg by mouth daily before breakfast.   Yes [provider]  HYDROcodone-acetaminophen (NORCO/VICODIN) 5-325 MG tablet Take by mouth. 11/10/20  Yes [provider]  ibuprofen (ADVIL,MOTRIN) 800 MG tablet Take 800 mg by mouth every 6 (six) hours as needed for mild pain or moderate pain.    Yes [provider]  insulin glargine (LANTUS) 100 UNIT/ML injection Inject 15 Units into the skin at bedtime.   Yes [provider]  insulin regular (NOVOLIN R) 100 units/mL injection Inject into the skin 3 (three) times daily before meals. SSI   Yes [provider]  ipratropium-albuterol (DUONEB) 0.5-2.5 (3) MG/3ML SOLN Take 3 mLs by nebulization every 6 (six) hours as needed.   Yes [provider]  JANUMET XR 50-500 MG TB24 Take 1 tablet by mouth daily. 02/28/22  Yes [provider]  levothyroxine (SYNTHROID, LEVOTHROID) 100 MCG tablet Take 100 mcg by mouth daily before breakfast.   Yes [provider]  lisinopril (PRINIVIL,ZESTRIL) 10 MG tablet Take 1 tablet by mouth daily.   Yes [provider]  metoprolol succinate (TOPROL-XL) 25 MG 24 hr tablet Take 1 tablet by  mouth daily. 12/18/19 01/30/23 Yes [provider]  montelukast (SINGULAIR) 10 MG tablet Take 10 mg by mouth at bedtime.   Yes [provider]  ondansetron (ZOFRAN) 8 MG tablet Take 8 mg by mouth every 8 (eight) hours as needed for nausea or vomiting.    Yes [provider]  pantoprazole  (PROTONIX) 40 MG tablet Take 40 mg by mouth daily. 12/31/21  Yes [provider]  progesterone (PROMETRIUM) 100 MG capsule Take 100 mg by mouth daily.   Yes [provider]  promethazine-dextromethorphan (PROMETHAZINE-DM) 6.25-15 MG/5ML syrup Take 5 mLs by mouth every 6 (six) hours as needed for cough. 01/25/23  Yes [provider]  Autumn Ramos 200-62.5-25 MCG/ACT AEPB Inhale 1 puff into the lungs daily. 02/27/22  Yes [provider]  Accu-Chek Softclix Lancets lancets Accu-Chek Softclix Lancets    [provider]  aspirin EC 81 MG tablet Take 81 mg by mouth daily. Patient not taking: Reported on 04/17/2022    [provider]  glucose blood test strip OneTouch Ultra Blue Test Strip  TEST BLOOD GLUCOSE LEVELS TWICE DAILY 03/31/21   [provider]  isometheptene-acetaminophen-dichloralphenazone (MIDRIN) 65-100-325 MG capsule Take 1-2 capsules by mouth 2 (two) times daily as needed for migraine.  Patient not taking: Reported on 04/17/2022    [provider]  predniSONE (DELTASONE) 20 MG tablet as needed. Patient not taking: Reported on 09/11/2022 01/24/22   [provider]  tirzepatide Autumn Ramos) 2.5 MG/0.5ML Pen Inject 2.5 mg into the skin once a week. Patient not taking: Reported on 01/30/2023    [provider]    Past Medical History:  Diagnosis Date   Anemia    Anxiety    Breast cancer ([place])    Complication of anesthesia    difficult to wake up, very sensitive to medications   COPD (chronic obstructive pulmonary disease) ([place])    Diabetes mellitus without complication ([place])    Family history of adverse reaction to anesthesia    mother difficult to wake up   GERD (gastroesophageal reflux disease)    H/O breast implant    Hypertension    Hypothyroidism    Last menstrual period (LMP) > 10 days ago 10/30/2004   Pneumonia    Past Surgical History:  Procedure Laterality Date   BREAST ENHANCEMENT  SURGERY     BREAST SURGERY     c sections x 2     DILATATION & CURETTAGE/HYSTEROSCOPY WITH MYOSURE N/A 09/11/2022   Procedure: [place], RESECTION OF POLYP;  Surgeon: Schermerhorn, Gwen Her, MD;  Location: [facility] ORS;  Service: Gynecology;  Laterality: N/A;   DILATION AND CURETTAGE OF UTERUS     MASTECTOMY     TONSILLECTOMY     Social History:  reports that she quit smoking about 8 years ago. Her smoking use included cigarettes. She has a 6.25 pack-year smoking history. She has never used smokeless tobacco. She reports current alcohol use. She reports current drug use. Allergies  Allergen Reactions   Acetylcysteine Shortness Of Breath   Tramadol Itching   Codeine Itching and Nausea And Vomiting    Other Reaction(s): vomiting   Family History  Problem Relation Age of Onset   Coronary artery disease Father    Hypertension Father    Coronary artery disease Mother    Hypertension Mother    Diabetes Mother    Physical Exam: Vitals:   01/30/23 1319 01/30/23 1320 01/30/23 1809 01/30/23 1817  BP: (!) 161/83  Marland Kitchen)  162/65   Pulse: 90  (!) 108   Resp: 20  (!) 22   Temp: 98.2 F (36.8 C)     TempSrc: Oral     SpO2: 93%  91% (!) 89%  Weight:  88.5 kg    Height:  5' (1.524 m)     General: Appear in moderate distress; no visible Abnormal Neck Mass Or lumps, Conjunctiva normal Cardiovascular: S1 and S2 Present, no Murmur, Respiratory: good respiratory effort, Bilateral Air entry present and no Crackles, Occasional  wheezes Abdomen: Bowel Sound present, Non tender  Extremities: no Pedal edema Neurology: alert and oriented to time, place, and person Gait not checked due to patient safety concerns   Data Reviewed: I have reviewed ED notes, Vitals, Lab results and outpatient records. Since last encounter, pertinent lab results CBC and BMP   . I have ordered test including CBC and BMP  . I have independently visualized and interpreted  imaging chest x-ray which showed no evidence of pneumonia  .   Assessment and Plan COPD exacerbation Most likely secondary to mild bronchitis. Patient has already completed 5 days of treatment course outpatient with Augmentin. Will continue with IV ceftriaxone for now. Check procalcitonin level if negative would recommend to discontinue antibiotic. COVID-negative influenza negative RSV negative. Had some wheezing and was hypoxic to 89% on room air at rest in ER At the time of my evaluation 93%. Continue with DuoNebs.  Continue with antibiotics that was started in the ED. Treat with IV Solu-Medrol 1 dose and switch to oral prednisone tomorrow. Home O2 evaluation tomorrow morning.  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 Patient was prescribed basal bolus regimen.  She was recently taken off of Ozempic due to lack of availability. Blood sugars reported to be 400-500 outpatient on 20 mg of prednisone. Currently blood work shows blood sugar of 442. Will provide resistant sliding scale insulin. Carb modified diet. Use 20 units of basal regimen tonight. 6 units of Premeal coverage as well. Continue home glipizide as well. Most likely will require an aggressive regimen on discharge.  Also will require endocrine referral on discharge.  Monitor.  Gastroesophageal reflux disease Continue PPI.  Hyperlipidemia Continue statin.  Hypertension Blood pressure stable. Continue home regimen.  Hypothyroidism Continue home Synthroid.  Neuropathy. Continue gabapentin.  Obesity Body mass index is 38.1 kg/m.  Placing the patient at high risk for poor outcome.  Chronic narcotic use Continue Norco per home regimen.  Advance Care Planning:   Code Status: Full Code  Consults: none Family Communication: none at bedside.  Author: Berle Mull, MD 01/30/2023 8:15 PM For on call review [email or web address].

## 2023-01-31 ENCOUNTER — Encounter: Payer: Self-pay | Admitting: Internal Medicine

## 2023-01-31 DIAGNOSIS — Z87891 Personal history of nicotine dependence: Secondary | ICD-10-CM | POA: Diagnosis not present

## 2023-01-31 DIAGNOSIS — Z794 Long term (current) use of insulin: Secondary | ICD-10-CM | POA: Diagnosis not present

## 2023-01-31 DIAGNOSIS — I10 Essential (primary) hypertension: Secondary | ICD-10-CM | POA: Diagnosis present

## 2023-01-31 DIAGNOSIS — Z79891 Long term (current) use of opiate analgesic: Secondary | ICD-10-CM | POA: Diagnosis not present

## 2023-01-31 DIAGNOSIS — E785 Hyperlipidemia, unspecified: Secondary | ICD-10-CM | POA: Diagnosis present

## 2023-01-31 DIAGNOSIS — Z6838 Body mass index (BMI) 38.0-38.9, adult: Secondary | ICD-10-CM | POA: Diagnosis not present

## 2023-01-31 DIAGNOSIS — E039 Hypothyroidism, unspecified: Secondary | ICD-10-CM | POA: Diagnosis present

## 2023-01-31 DIAGNOSIS — Z7984 Long term (current) use of oral hypoglycemic drugs: Secondary | ICD-10-CM | POA: Diagnosis not present

## 2023-01-31 DIAGNOSIS — F329 Major depressive disorder, single episode, unspecified: Secondary | ICD-10-CM | POA: Diagnosis present

## 2023-01-31 DIAGNOSIS — Z833 Family history of diabetes mellitus: Secondary | ICD-10-CM | POA: Diagnosis not present

## 2023-01-31 DIAGNOSIS — Z1152 Encounter for screening for COVID-19: Secondary | ICD-10-CM | POA: Diagnosis not present

## 2023-01-31 DIAGNOSIS — Z8249 Family history of ischemic heart disease and other diseases of the circulatory system: Secondary | ICD-10-CM | POA: Diagnosis not present

## 2023-01-31 DIAGNOSIS — R0902 Hypoxemia: Secondary | ICD-10-CM | POA: Diagnosis present

## 2023-01-31 DIAGNOSIS — Z7982 Long term (current) use of aspirin: Secondary | ICD-10-CM | POA: Diagnosis not present

## 2023-01-31 DIAGNOSIS — R0602 Shortness of breath: Secondary | ICD-10-CM | POA: Diagnosis present

## 2023-01-31 DIAGNOSIS — K219 Gastro-esophageal reflux disease without esophagitis: Secondary | ICD-10-CM | POA: Diagnosis present

## 2023-01-31 DIAGNOSIS — E1140 Type 2 diabetes mellitus with diabetic neuropathy, unspecified: Secondary | ICD-10-CM | POA: Diagnosis present

## 2023-01-31 DIAGNOSIS — Z853 Personal history of malignant neoplasm of breast: Secondary | ICD-10-CM | POA: Diagnosis not present

## 2023-01-31 DIAGNOSIS — J440 Chronic obstructive pulmonary disease with acute lower respiratory infection: Secondary | ICD-10-CM | POA: Diagnosis present

## 2023-01-31 DIAGNOSIS — Z79890 Hormone replacement therapy (postmenopausal): Secondary | ICD-10-CM | POA: Diagnosis not present

## 2023-01-31 DIAGNOSIS — E669 Obesity, unspecified: Secondary | ICD-10-CM | POA: Diagnosis present

## 2023-01-31 DIAGNOSIS — I4510 Unspecified right bundle-branch block: Secondary | ICD-10-CM | POA: Diagnosis present

## 2023-01-31 DIAGNOSIS — E1165 Type 2 diabetes mellitus with hyperglycemia: Secondary | ICD-10-CM | POA: Diagnosis present

## 2023-01-31 DIAGNOSIS — J441 Chronic obstructive pulmonary disease with (acute) exacerbation: Secondary | ICD-10-CM | POA: Diagnosis present

## 2023-01-31 DIAGNOSIS — Z7951 Long term (current) use of inhaled steroids: Secondary | ICD-10-CM | POA: Diagnosis not present

## 2023-01-31 LAB — BASIC METABOLIC PANEL
Anion gap: 7 (ref 5–15)
BUN: 9 mg/dL (ref 8–23)
CO2: 25 mmol/L (ref 22–32)
Calcium: 8.8 mg/dL — ABNORMAL LOW (ref 8.9–10.3)
Chloride: 105 mmol/L (ref 98–111)
Creatinine, Ser: 0.53 mg/dL (ref 0.44–1.00)
GFR, Estimated: >60 mL/min (ref >60)
Glucose, Bld: 320 mg/dL — ABNORMAL HIGH (ref 70–99)
Potassium: 4.2 mmol/L (ref 3.5–5.1)
Sodium: 137 mmol/L (ref 135–145)

## 2023-01-31 LAB — HEMOGLOBIN A1C
Hgb A1c MFr Bld: 11 % — ABNORMAL HIGH (ref 4.8–5.6)
Mean Plasma Glucose: 269 mg/dL

## 2023-01-31 LAB — CBC
HCT: 43.4 % (ref 36.0–46.0)
Hemoglobin: 14 g/dL (ref 12.0–15.0)
MCH: 28.2 pg (ref 26.0–34.0)
MCHC: 32.3 g/dL (ref 30.0–36.0)
MCV: 87.5 fL (ref 80.0–100.0)
Platelets: 263 10*3/uL (ref 150–400)
RBC: 4.96 MIL/uL (ref 3.87–5.11)
RDW: 13.6 % (ref 11.5–15.5)
WBC: 7.6 10*3/uL (ref 4.0–10.5)
nRBC: 0 % (ref 0.0–0.2)

## 2023-01-31 LAB — GLUCOSE, CAPILLARY
Glucose-Capillary: 314 mg/dL — ABNORMAL HIGH (ref 70–99)
Glucose-Capillary: 335 mg/dL — ABNORMAL HIGH (ref 70–99)
Glucose-Capillary: 322 mg/dL — ABNORMAL HIGH (ref 70–99)
Glucose-Capillary: 356 mg/dL — ABNORMAL HIGH (ref 70–99)

## 2023-01-31 LAB — HIV ANTIBODY (ROUTINE TESTING W REFLEX): HIV Screen 4th Generation wRfx: NONREACTIVE

## 2023-01-31 MED ORDER — BENZONATATE 100 MG PO CAPS
200.0000 mg | ORAL_CAPSULE | Freq: Three times a day (TID) | ORAL | Status: DC
  Administered 2023-01-31 – 2023-02-01 (×3): 200 mg via ORAL
  Filled 2023-01-31 (×3): qty 2

## 2023-01-31 MED ORDER — METHYLPREDNISOLONE SODIUM SUCC 125 MG IJ SOLR
60.0000 mg | INTRAMUSCULAR | Status: DC
  Administered 2023-01-31 – 2023-02-01 (×2): 60 mg via INTRAVENOUS
  Filled 2023-01-31 (×2): qty 2

## 2023-01-31 MED ORDER — ARFORMOTEROL TARTRATE 15 MCG/2ML IN NEBU
15.0000 ug | INHALATION_SOLUTION | Freq: Two times a day (BID) | RESPIRATORY_TRACT | Status: DC
  Administered 2023-01-31 – 2023-02-01 (×3): 15 ug via RESPIRATORY_TRACT
  Filled 2023-01-31 (×3): qty 2

## 2023-01-31 MED ORDER — BUDESONIDE 0.25 MG/2ML IN SUSP
0.2500 mg | Freq: Two times a day (BID) | RESPIRATORY_TRACT | Status: DC
  Administered 2023-01-31 – 2023-02-01 (×3): 0.25 mg via RESPIRATORY_TRACT
  Filled 2023-01-31 (×3): qty 2

## 2023-01-31 NOTE — Progress Notes (Signed)
PROGRESS NOTE    Autumn Ramos  [id number] DOB: [birth year] DOA: 01/30/2023 PCP: [name], MD    Brief Narrative:   67 y.o. female with PMH significant of COPD, HLD, HTN, type II DM, GERD, hypothyroidism, chronic pain. Presented to hospital with complaints of cough and shortness of breath. Symptoms are ongoing since last 1 week. Went to see outpatient walk-in clinic on 3/28. Patient was prescribed Augmentin and prednisone 20 mg. Patient completed her course and originally had some improvement in her sputum color-turn clear from yellow but have recurrence of the symptoms again on 4/2. Has been feeling fatigue and tired and has not been able to ambulate without any shortness of breath or dizziness since last 3 to 4 days. With 20 mg prednisone her blood sugars have been ranging in 400-500 range. Denies any nausea or vomiting.  No fever no chills.  No diarrhea no constipation. Remains compliant with all her medication.  Does not smoke right now but had a history and quit in 2015.  Assessment & Plan:   Principal Problem:   COPD exacerbation Active Problems:   Primary cancer of lower-outer quadrant of left breast   Chronic obstructive lung disease   Gastroesophageal reflux disease   Hyperlipidemia   Hypertension   Hypothyroidism   Major depressive disorder   Obesity   Chronic narcotic use   Uncontrolled type 2 diabetes mellitus with hyperglycemia, with long-term current use of insulin  COPD exacerbation Most likely secondary to mild bronchitis. Patient has already completed 5 days of treatment course outpatient with Augmentin. COVID-negative influenza negative RSV negative. Had some wheezing and was hypoxic to 89% on room air at rest in ER Plan: Continue IV Solu-Medrol 60 mg daily DuoNebs every 6 hours Twice daily Brovana Twice daily Pulmicort Antitussives and mucolytic's   Uncontrolled type 2 diabetes mellitus with hyperglycemia, with long-term current use of  insulin with neuropathy. Basal bolus regimen Carb modified diet Diabetes coordinator consult  Gastroesophageal reflux disease Continue PPI.   Hyperlipidemia Continue statin.   Hypertension Blood pressure stable. Continue home regimen.   Hypothyroidism Continue home Synthroid.  Diabetic neuropathy Continue gabapentin.   Obesity Body mass index is 38.1 kg/m.  This complicates overall care and prognosis   Chronic narcotic use Continue Norco per home regimen.   DVT prophylaxis: SQ Lovenox Code Status: Full Family Communication: None today Disposition Plan: Status is: Observation The patient will require care spanning > 2 midnights and should be moved to inpatient because: COPD exacerbation on intravenous steroids   Level of care: Med-Surg  Consultants:  None Procedures:  None  Antimicrobials: Rocephin   Subjective: Seen and examined.  Resting comfortably in bed.  No apparent distress.  Still endorses shortness of breath and wheezing.  Objective: Vitals:   01/31/23 0110 01/31/23 0540 01/31/23 0730 01/31/23 1200  BP:  131/70 (!) 152/77 132/62  Pulse:  76 83 91  Resp:  18 20   Temp:  97.9 F (36.6 C) 97.7 F (36.5 C) 97.7 F (36.5 C)  TempSrc:  Oral  Oral  SpO2: 96% 96% 94% 97%  Weight:      Height:        Intake/Output Summary (Last 24 hours) at 01/31/2023 1309 Last data filed at 01/31/2023 1039 Gross per 24 hour  Intake 1140 ml  Output --  Net 1140 ml   Filed Weights   01/30/23 1320 01/30/23 2218  Weight: 88.5 kg 88.7 kg    Examination:  General exam: No acute distress.  Appears fatigued Respiratory system: Scattered mild end expiratory wheeze.  Normal work of breathing.  Room air Cardiovascular system: S1-S2, RRR, no murmurs, no pedal edema Gastrointestinal system: Obese, soft, NT/ND, normal bowel sounds Central nervous system: Alert and oriented. No focal neurological deficits. Extremities: Symmetric 5 x 5 power. Skin: No rashes, lesions  or ulcers Psychiatry: Judgement and insight appear normal. Mood & affect appropriate.     Data Reviewed: I have personally reviewed following labs and imaging studies  CBC: Recent Labs  Lab 01/30/23 1321 01/31/23 0528  WBC 8.4 7.6  HGB 13.8 14.0  HCT 42.3 43.4  MCV 88.7 87.5  PLT 248 [id number]   Basic Metabolic Panel: Recent Labs  Lab 01/30/23 1321 01/31/23 0528  NA 135 137  K 3.5 4.2  CL 99 105  CO2 27 25  GLUCOSE 442* 320*  BUN 12 9  CREATININE 0.59 0.53  CALCIUM 8.8* 8.8*   GFR: Estimated Creatinine Clearance: 68.6 mL/min (by C-G formula based on SCr of 0.53 mg/dL). Liver Function Tests: Recent Labs  Lab 01/30/23 1321  AST 23  ALT 30  ALKPHOS 80  BILITOT 0.6  PROT 6.4*  ALBUMIN 3.5   No results for input(s): "LIPASE", "AMYLASE" in the last 168 hours. No results for input(s): "AMMONIA" in the last 168 hours. Coagulation Profile: No results for input(s): "INR", "PROTIME" in the last 168 hours. Cardiac Enzymes: No results for input(s): "CKTOTAL", "CKMB", "CKMBINDEX", "TROPONINI" in the last 168 hours. BNP (last 3 results) No results for input(s): "PROBNP" in the last 8760 hours. HbA1C: No results for input(s): "HGBA1C" in the last 72 hours. CBG: Recent Labs  Lab 01/30/23 2030 01/30/23 2217 01/31/23 0731 01/31/23 1158  GLUCAP 296* 374* 314* 335*   Lipid Profile: No results for input(s): "CHOL", "HDL", "LDLCALC", "TRIG", "CHOLHDL", "LDLDIRECT" in the last 72 hours. Thyroid Function Tests: No results for input(s): "TSH", "T4TOTAL", "FREET4", "T3FREE", "THYROIDAB" in the last 72 hours. Anemia Panel: No results for input(s): "VITAMINB12", "FOLATE", "FERRITIN", "TIBC", "IRON", "RETICCTPCT" in the last 72 hours. Sepsis Labs: No results for input(s): "PROCALCITON", "LATICACIDVEN" in the last 168 hours.  Recent Results (from the past 240 hour(s))  Resp panel by RT-PCR (RSV, Flu A&B, Covid) Anterior Nasal Swab     Status: None   Collection Time: 01/30/23   1:25 PM   Specimen: Anterior Nasal Swab  Result Value Ref Range Status   SARS Coronavirus 2 by RT PCR NEGATIVE NEGATIVE Final    Comment: (NOTE) SARS-CoV-2 target nucleic acids are NOT DETECTED.  The SARS-CoV-2 RNA is generally detectable in upper respiratory specimens during the acute phase of infection. The lowest concentration of SARS-CoV-2 viral copies this assay can detect is 138 copies/mL. A negative result does not preclude SARS-Cov-2 infection and should not be used as the sole basis for treatment or other patient management decisions. A negative result may occur with  improper specimen collection/handling, submission of specimen other than nasopharyngeal swab, presence of viral mutation(s) within the areas targeted by this assay, and inadequate number of viral copies(<138 copies/mL). A negative result must be combined with clinical observations, patient history, and epidemiological information. The expected result is Negative.  Fact Sheet for Patients:  EntrepreneurPulse.com.au  Fact Sheet for Healthcare Providers:  IncredibleEmployment.be  This test is no t yet approved or cleared by the [place] FDA and  has been authorized for detection and/or diagnosis of SARS-CoV-2 by FDA under an Emergency Use Authorization (EUA). This EUA will remain  in effect (meaning this test  can be used) for the duration of the COVID-19 declaration under Section 564(b)(1) of the Act, 21 U.S.C.section 360bbb-3(b)(1), unless the authorization is terminated  or revoked sooner.       Influenza A by PCR NEGATIVE NEGATIVE Final   Influenza B by PCR NEGATIVE NEGATIVE Final    Comment: (NOTE) The Xpert Xpress SARS-CoV-2/FLU/RSV plus assay is intended as an aid in the diagnosis of influenza from Nasopharyngeal swab specimens and should not be used as a sole basis for treatment. Nasal washings and aspirates are unacceptable for Xpert Xpress  SARS-CoV-2/FLU/RSV testing.  Fact Sheet for Patients: EntrepreneurPulse.com.au  Fact Sheet for Healthcare Providers: IncredibleEmployment.be  This test is not yet approved or cleared by the [place] FDA and has been authorized for detection and/or diagnosis of SARS-CoV-2 by FDA under an Emergency Use Authorization (EUA). This EUA will remain in effect (meaning this test can be used) for the duration of the COVID-19 declaration under Section 564(b)(1) of the Act, 21 U.S.C. section 360bbb-3(b)(1), unless the authorization is terminated or revoked.     Resp Syncytial Virus by PCR NEGATIVE NEGATIVE Final    Comment: (NOTE) Fact Sheet for Patients: EntrepreneurPulse.com.au  Fact Sheet for Healthcare Providers: IncredibleEmployment.be  This test is not yet approved or cleared by the [place] FDA and has been authorized for detection and/or diagnosis of SARS-CoV-2 by FDA under an Emergency Use Authorization (EUA). This EUA will remain in effect (meaning this test can be used) for the duration of the COVID-19 declaration under Section 564(b)(1) of the Act, 21 U.S.C. section 360bbb-3(b)(1), unless the authorization is terminated or revoked.  Performed at [facility], [address] [place], [place] [address]          Radiology Studies: DG Chest 2 View  Result Date: 01/30/2023 CLINICAL DATA:  Shortness of breath. EXAM: CHEST - 2 VIEW COMPARISON:  04/02/2021 FINDINGS: The cardiac silhouette, mediastinal and hilar contours are within normal limits and stable. There are streaky basilar scarring changes but no acute pulmonary process. No pleural effusions or pulmonary lesions. No pneumothorax. The bony thorax is intact. IMPRESSION: Streaky basilar scarring changes but no acute cardiopulmonary findings. Electronically Signed   By: Marijo Sanes M.D.   On: 01/30/2023 13:47        Scheduled  Meds:  arformoterol  15 mcg Nebulization BID   atorvastatin  80 mg Oral QHS   azithromycin  500 mg Oral Daily   benzonatate  200 mg Oral TID   budesonide (PULMICORT) nebulizer solution  0.25 mg Nebulization BID   dextromethorphan  30 mg Oral BID   enoxaparin (LOVENOX) injection  40 mg Subcutaneous Q24H   ezetimibe  10 mg Oral Daily   gabapentin  600 mg Oral QHS   guaiFENesin  1,200 mg Oral BID   insulin aspart  0-20 Units Subcutaneous TID WC   insulin aspart  0-5 Units Subcutaneous QHS   insulin aspart  6 Units Subcutaneous TID WC   insulin glargine-yfgn  20 Units Subcutaneous QHS   ipratropium-albuterol  3 mL Nebulization Q6H   levothyroxine  100 mcg Oral QAC breakfast   lisinopril  10 mg Oral Daily   methylPREDNISolone (SOLU-MEDROL) injection  60 mg Intravenous Q24H   metoprolol succinate  25 mg Oral Daily   montelukast  10 mg Oral QHS   pantoprazole  40 mg Oral Daily   progesterone  100 mg Oral Daily   Continuous Infusions:  cefTRIAXone (ROCEPHIN)  IV       LOS:  0 days    Sidney Ace, MD [facility]   If 7PM-7AM, please contact night-coverage  01/31/2023, 1:09 PM

## 2023-01-31 NOTE — Inpatient Diabetes Management (Signed)
Inpatient Diabetes Program Recommendations  AACE/ADA: New Consensus Statement on Inpatient Glycemic Control (2015)  Target Ranges:  Prepandial:   less than 140 mg/dL      Peak postprandial:   less than 180 mg/dL (1-2 hours)      Critically ill patients:  140 - 180 mg/dL   Lab Results  Component Value Date   GLUCAP 314 (H) 01/31/2023   HGBA1C 9.8 (H) 09/04/2022    Review of Glycemic Control  Diabetes history: DM2 Outpatient Diabetes medications:  Lantus 15 QD Novolin R SSI TID Janumet 50-500 mg QD Glipized 10 mg BID  Current orders for Inpatient glycemic control:  Semglee 20 units QD Novolog 0-20 units TID and 0-5 units QHS Novolog 6 units TID with meals Glipizide 10 mg QD  Inpatient Diabetes Program Recommendations:    Might consider discontinuing Glipizide while inpatient and receiving Novolog 6 units meal coverage as this may increase risk of hypoglycemia.  Will continue to follow while inpatient.  Thank you, Reche Dixon, MSN, [place] Diabetes Coordinator Inpatient Diabetes Program [phone] (team pager from 8a-5p)

## 2023-01-31 NOTE — Progress Notes (Signed)
PT Screen Note  Patient Details Name: Autumn Ramos MRN: [id number] DOB: [birth year]   Cancelled Treatment:    Reason Eval/Treat Not Completed: PT screened, no needs identified, will sign off Chart reviewed, spoke with RN who states she has been up ad lib in room w/o issue.  On arrival pt had just returned from walking many 100s of feet to the lobby and came back with a coffee.  She reports she is at her mobility baseline (does see outpt PT for cervical disc issue - should continue) and that she does not feel that she needs PT in the acute setting.  O2 on room air during our conversation was in the low 90s.  Will sign off, no formal PT needs.    Kreg Shropshire, DPT 01/31/2023, 11:35 AM

## 2023-02-01 DIAGNOSIS — J441 Chronic obstructive pulmonary disease with (acute) exacerbation: Secondary | ICD-10-CM | POA: Diagnosis not present

## 2023-02-01 LAB — GLUCOSE, CAPILLARY: Glucose-Capillary: 314 mg/dL — ABNORMAL HIGH (ref 70–99)

## 2023-02-01 MED ORDER — IPRATROPIUM-ALBUTEROL 0.5-2.5 (3) MG/3ML IN SOLN: 3.0000 mL | RESPIRATORY_TRACT | 0 refills | Status: AC | PRN

## 2023-02-01 MED ORDER — PREDNISONE 20 MG PO TABS: 40.0000 mg | ORAL_TABLET | Freq: Every day | ORAL | 0 refills | Status: AC

## 2023-02-01 MED ORDER — AMOXICILLIN 500 MG PO CAPS: 500.0000 mg | ORAL_CAPSULE | Freq: Two times a day (BID) | ORAL | 0 refills | Status: AC

## 2023-02-01 MED ORDER — BENZONATATE 200 MG PO CAPS: 200.0000 mg | ORAL_CAPSULE | Freq: Three times a day (TID) | ORAL | 0 refills | Status: AC

## 2023-02-01 MED ORDER — GUAIFENESIN ER 600 MG PO TB12: 600.0000 mg | ORAL_TABLET | Freq: Two times a day (BID) | ORAL | 0 refills | Status: AC

## 2023-02-01 NOTE — TOC Transition Note (Signed)
Transition of Care [facility]) - CM/SW Discharge Note   Patient Details  Name: Autumn Ramos MRN: [id number] Date of Birth: [birth year]  [facility] CM/SW Contact:  Gerilyn Pilgrim, LCSW Phone Number: 02/01/2023, 11:11 AM   Clinical Narrative:   Pt discharging home with [facility] for nursing care. Pt agreeable to this MD notified and Malachy Mood with [facility] notified.           Patient Goals and CMS Choice      Discharge Placement                         Discharge Plan and Services Additional resources added to the After Visit Summary for                                       Social Determinants of Health (SDOH) Interventions SDOH Screenings   Food Insecurity: No Food Insecurity (01/31/2023)  Housing: Low Risk  (01/31/2023)  Transportation Needs: No Transportation Needs (01/31/2023)  Utilities: Not At Risk (01/31/2023)  Depression (PHQ2-9): Medium Risk (05/23/2022)  Tobacco Use: Medium Risk (01/31/2023)     Readmission Risk Interventions     No data to display

## 2023-02-01 NOTE — Discharge Summary (Signed)
Physician Discharge Summary  Autumn Ramos [id number] DOB: 1955-12-28 DOA: 01/30/2023  PCP: [name], MD  Admit date: 01/30/2023 Discharge date: 02/01/2023  Admitted From: Home Disposition:  Home  Recommendations for Outpatient Follow-up:  Follow up with PCP in 1-2 weeks   Home Health: Yes PT RN Equipment/Devices:None   Discharge Condition:Stable  CODE STATUS:FULL  Diet recommendation: Carb  Brief/Interim Summary:  67 y.o. female with PMH significant of COPD, HLD, HTN, type II DM, GERD, hypothyroidism, chronic pain. Presented to hospital with complaints of cough and shortness of breath. Symptoms are ongoing since last 1 week. Went to see outpatient walk-in clinic on 3/28. Patient was prescribed Augmentin and prednisone 20 mg. Patient completed her course and originally had some improvement in her sputum color-turn clear from yellow but have recurrence of the symptoms again on 4/2. Has been feeling fatigue and tired and has not been able to ambulate without any shortness of breath or dizziness since last 3 to 4 days. With 20 mg prednisone her blood sugars have been ranging in 400-500 range. Denies any nausea or vomiting.  No fever no chills.  No diarrhea no constipation. Remains compliant with all her medication.  Does not smoke right now but had a history and quit in 2015.   Discharge Diagnoses:  Principal Problem:   COPD exacerbation Active Problems:   Primary cancer of lower-outer quadrant of left breast   Chronic obstructive lung disease   Gastroesophageal reflux disease   Hyperlipidemia   Hypertension   Hypothyroidism   Major depressive disorder   Obesity   Chronic narcotic use   Uncontrolled type 2 diabetes mellitus with hyperglycemia, with long-term current use of insulin  COPD exacerbation Most likely secondary to mild bronchitis. Patient has already completed 5 days of treatment course outpatient with Augmentin. COVID-negative influenza negative RSV  negative. Had some wheezing and was hypoxic to 89% on room air at rest in ER Plan: Discontinue IV steroids.  Start p.o. prednisone.  40 mg daily x 5 days.  Can resume prior to arrival nebulizer regimen.  Antitussives and mucolytic's prescribed on discharge    Uncontrolled type 2 diabetes mellitus with hyperglycemia, with long-term current use of insulin with neuropathy. Patient to resume her multimodal regimen at home.  Recommended to increase basal insulin dose from 15 units nightly to 20 units till completion of course of prednisone.  Gastroesophageal reflux disease Continue PPI.   Hyperlipidemia Continue statin.   Hypertension Blood pressure stable. Continue home regimen.   Hypothyroidism Continue home Synthroid.   Diabetic neuropathy Continue gabapentin.   Obesity Body mass index is 38.1 kg/m.  This complicates overall care and prognosis   Chronic narcotic use Continue Norco per home regimen.  Discharge Instructions  Discharge Instructions     Diet - low sodium heart healthy   Complete by: As directed    Increase activity slowly   Complete by: As directed       Allergies as of 02/01/2023       Reactions   Acetylcysteine Shortness Of Breath   Tramadol Itching   Codeine Itching, Nausea And Vomiting   Other Reaction(s): vomiting        Medication List     STOP taking these medications    amoxicillin-clavulanate 875-125 MG tablet Commonly known as: AUGMENTIN   aspirin EC 81 MG tablet   isometheptene-acetaminophen-dichloralphenazone 65-100-325 MG capsule Commonly known as: MIDRIN   Mounjaro 2.5 MG/0.5ML Pen Generic drug: tirzepatide       TAKE these medications  Accu-Chek Softclix Lancets lancets Accu-Chek Softclix Lancets   albuterol 108 (90 Base) MCG/ACT inhaler Commonly known as: VENTOLIN HFA Inhale 1-2 puffs into the lungs every 4 (four) hours as needed for wheezing or shortness of breath.   ALPRAZolam 0.25 MG tablet Commonly  known as: XANAX as needed. For procedure   amoxicillin 500 MG capsule Commonly known as: AMOXIL Take 1 capsule (500 mg total) by mouth 2 (two) times daily for 5 days.   atorvastatin 80 MG tablet Commonly known as: LIPITOR Take 80 mg by mouth at bedtime.   benzonatate 200 MG capsule Commonly known as: TESSALON Take 1 capsule (200 mg total) by mouth 3 (three) times daily.   EPINEPHrine 0.3 mg/0.3 mL Soaj injection Commonly known as: EPI-PEN epinephrine 0.3 mg/0.3 mL injection, auto-injector   ezetimibe 10 MG tablet Commonly known as: ZETIA Take 10 mg by mouth daily.   gabapentin 300 MG capsule Commonly known as: NEURONTIN Take 2 capsules by mouth at bedtime.   glipiZIDE 10 MG tablet Commonly known as: GLUCOTROL Take 10 mg by mouth daily before breakfast.   glucose blood test strip OneTouch Ultra Blue Test Strip  TEST BLOOD GLUCOSE LEVELS TWICE DAILY   guaiFENesin 600 MG 12 hr tablet Commonly known as: MUCINEX Take 1 tablet (600 mg total) by mouth 2 (two) times daily for 7 days.   HYDROcodone-acetaminophen 5-325 MG tablet Commonly known as: NORCO/VICODIN Take by mouth.   ibuprofen 800 MG tablet Commonly known as: ADVIL Take 800 mg by mouth every 6 (six) hours as needed for mild pain or moderate pain.   insulin glargine 100 UNIT/ML injection Commonly known as: LANTUS Inject 15 Units into the skin at bedtime.   insulin regular 100 units/mL injection Commonly known as: NOVOLIN R Inject into the skin 3 (three) times daily before meals. SSI   ipratropium-albuterol 0.5-2.5 (3) MG/3ML Soln Commonly known as: DUONEB Take 3 mLs by nebulization every 6 (six) hours as needed. What changed: Another medication with the same name was added. Make sure you understand how and when to take each.   ipratropium-albuterol 0.5-2.5 (3) MG/3ML Soln Commonly known as: DUONEB Take 3 mLs by nebulization every 4 (four) hours as needed. What changed: You were already taking a  medication with the same name, and this prescription was added. Make sure you understand how and when to take each.   Janumet XR 50-500 MG Tb24 Generic drug: SitaGLIPtin-MetFORMIN HCl Take 1 tablet by mouth daily.   levothyroxine 100 MCG tablet Commonly known as: SYNTHROID Take 100 mcg by mouth daily before breakfast.   lisinopril 10 MG tablet Commonly known as: ZESTRIL Take 1 tablet by mouth daily.   metoprolol succinate 25 MG 24 hr tablet Commonly known as: TOPROL-XL Take 1 tablet by mouth daily.   montelukast 10 MG tablet Commonly known as: SINGULAIR Take 10 mg by mouth at bedtime.   ondansetron 8 MG tablet Commonly known as: ZOFRAN Take 8 mg by mouth every 8 (eight) hours as needed for nausea or vomiting.   pantoprazole 40 MG tablet Commonly known as: PROTONIX Take 40 mg by mouth daily.   predniSONE 20 MG tablet Commonly known as: DELTASONE Take 2 tablets (40 mg total) by mouth daily for 5 days. What changed:  how much to take how to take this when to take this reasons to take this   progesterone 100 MG capsule Commonly known as: PROMETRIUM Take 100 mg by mouth daily.   promethazine-dextromethorphan 6.25-15 MG/5ML syrup Commonly known as: PROMETHAZINE-DM  Take 5 mLs by mouth every 6 (six) hours as needed for cough.   Trelegy Ellipta 200-62.5-25 MCG/ACT Aepb Generic drug: Fluticasone-Umeclidin-Vilant Inhale 1 puff into the lungs daily.   Vitamin D3 50 MCG (2000 UT) Caps Generic drug: Cholecalciferol Take 2,000 Units by mouth daily.        Allergies  Allergen Reactions   Acetylcysteine Shortness Of Breath   Tramadol Itching   Codeine Itching and Nausea And Vomiting    Other Reaction(s): vomiting    Consultations: None   Procedures/Studies: DG Chest 2 View  Result Date: 01/30/2023 CLINICAL DATA:  Shortness of breath. EXAM: CHEST - 2 VIEW COMPARISON:  04/02/2021 FINDINGS: The cardiac silhouette, mediastinal and hilar contours are within normal  limits and stable. There are streaky basilar scarring changes but no acute pulmonary process. No pleural effusions or pulmonary lesions. No pneumothorax. The bony thorax is intact. IMPRESSION: Streaky basilar scarring changes but no acute cardiopulmonary findings. Electronically Signed   By: Marijo Sanes M.D.   On: 01/30/2023 13:47   DG Pelvis 1-2 Views  Result Date: 01/07/2023 CLINICAL DATA:  [id number] Pain [id number] EXAM: PELVIS - 1-2 VIEW COMPARISON:  CTA CAP, 04/02/2021 FINDINGS: There is no evidence of pelvic fracture or diastasis. No pelvic bone lesions are seen. IMPRESSION: No acute displaced fracture or dislocation within the pelvis Electronically Signed   By: Michaelle Birks M.D.   On: 01/07/2023 10:51   CT Head Wo Contrast  Result Date: 01/07/2023 CLINICAL DATA:  67 year old female with head and neck injury from fall. Initial encounter. EXAM: CT HEAD WITHOUT CONTRAST CT CERVICAL SPINE WITHOUT CONTRAST TECHNIQUE: Multidetector CT imaging of the head and cervical spine was performed following the standard protocol without intravenous contrast. Multiplanar CT image reconstructions of the cervical spine were also generated. RADIATION DOSE REDUCTION: This exam was performed according to the departmental dose-optimization program which includes automated exposure control, adjustment of the mA and/or kV according to patient size and/or use of iterative reconstruction technique. COMPARISON:  08/10/2020 cervical spine MR and 04/10/2014 cervical spine radiographs FINDINGS: CT HEAD FINDINGS Brain: No evidence of acute infarction, hemorrhage, hydrocephalus, extra-axial collection or mass lesion/mass effect. Vascular: Carotid atherosclerotic calcifications are noted. Skull: Normal. Negative for fracture or focal lesion. Sinuses/Orbits: No acute finding. Other: None. CT CERVICAL SPINE FINDINGS Alignment: Normal. Skull base and vertebrae: No acute fracture. No primary bone lesion or focal pathologic process. Soft tissues  and spinal canal: No prevertebral fluid or swelling. No visible canal hematoma. Disc levels: Multilevel facet arthropathy again noted contributing to bony foraminal narrowing at several levels. Minimal multilevel degenerative disc disease/spondylosis again noted. Upper chest: No acute abnormality Other: None IMPRESSION: 1. No evidence of acute intracranial abnormality. 2. No evidence of acute injury to the cervical spine. Degenerative changes again noted. Electronically Signed   By: Margarette Canada M.D.   On: 01/07/2023 10:11   CT Cervical Spine Wo Contrast  Result Date: 01/07/2023 CLINICAL DATA:  67 year old female with head and neck injury from fall. Initial encounter. EXAM: CT HEAD WITHOUT CONTRAST CT CERVICAL SPINE WITHOUT CONTRAST TECHNIQUE: Multidetector CT imaging of the head and cervical spine was performed following the standard protocol without intravenous contrast. Multiplanar CT image reconstructions of the cervical spine were also generated. RADIATION DOSE REDUCTION: This exam was performed according to the departmental dose-optimization program which includes automated exposure control, adjustment of the mA and/or kV according to patient size and/or use of iterative reconstruction technique. COMPARISON:  08/10/2020 cervical spine MR and 04/10/2014 cervical spine radiographs  FINDINGS: CT HEAD FINDINGS Brain: No evidence of acute infarction, hemorrhage, hydrocephalus, extra-axial collection or mass lesion/mass effect. Vascular: Carotid atherosclerotic calcifications are noted. Skull: Normal. Negative for fracture or focal lesion. Sinuses/Orbits: No acute finding. Other: None. CT CERVICAL SPINE FINDINGS Alignment: Normal. Skull base and vertebrae: No acute fracture. No primary bone lesion or focal pathologic process. Soft tissues and spinal canal: No prevertebral fluid or swelling. No visible canal hematoma. Disc levels: Multilevel facet arthropathy again noted contributing to bony foraminal narrowing at  several levels. Minimal multilevel degenerative disc disease/spondylosis again noted. Upper chest: No acute abnormality Other: None IMPRESSION: 1. No evidence of acute intracranial abnormality. 2. No evidence of acute injury to the cervical spine. Degenerative changes again noted. Electronically Signed   By: Margarette Canada M.D.   On: 01/07/2023 10:11      Subjective: Seen and examined on the day of discharge.  Stable no distress.  Stable for discharge home.  Discharge Exam: Vitals:   02/01/23 0709 02/01/23 0716  BP: 124/68   Pulse: 77   Resp: (!) 22   Temp: 97.8 F (36.6 C)   SpO2:  95%   Vitals:   01/31/23 2027 02/01/23 0221 02/01/23 0709 02/01/23 0716  BP: (!) 157/70  124/68   Pulse: 93  77   Resp: 18  (!) 22   Temp: 97.9 F (36.6 C)  97.8 F (36.6 C)   TempSrc:   Oral   SpO2: 99% 95%  95%  Weight:      Height:        General: Pt is alert, awake, not in acute distress Cardiovascular: RRR, S1/S2 +, no rubs, no gallops Respiratory: Equal air entry bilaterally.  Very mild end expiratory wheeze.  Normal work of breathing.  Room air Abdominal: Soft, NT, ND, bowel sounds + Extremities: no edema, no cyanosis    The results of significant diagnostics from this hospitalization (including imaging, microbiology, ancillary and laboratory) are listed below for reference.     Microbiology: Recent Results (from the past 240 hour(s))  Resp panel by RT-PCR (RSV, Flu A&B, Covid) Anterior Nasal Swab     Status: None   Collection Time: 01/30/23  1:25 PM   Specimen: Anterior Nasal Swab  Result Value Ref Range Status   SARS Coronavirus 2 by RT PCR NEGATIVE NEGATIVE Final    Comment: (NOTE) SARS-CoV-2 target nucleic acids are NOT DETECTED.  The SARS-CoV-2 RNA is generally detectable in upper respiratory specimens during the acute phase of infection. The lowest concentration of SARS-CoV-2 viral copies this assay can detect is 138 copies/mL. A negative result does not preclude  SARS-Cov-2 infection and should not be used as the sole basis for treatment or other patient management decisions. A negative result may occur with  improper specimen collection/handling, submission of specimen other than nasopharyngeal swab, presence of viral mutation(s) within the areas targeted by this assay, and inadequate number of viral copies(<138 copies/mL). A negative result must be combined with clinical observations, patient history, and epidemiological information. The expected result is Negative.  Fact Sheet for Patients:  EntrepreneurPulse.com.au  Fact Sheet for Healthcare Providers:  IncredibleEmployment.be  This test is no t yet approved or cleared by the [place] FDA and  has been authorized for detection and/or diagnosis of SARS-CoV-2 by FDA under an Emergency Use Authorization (EUA). This EUA will remain  in effect (meaning this test can be used) for the duration of the COVID-19 declaration under Section 564(b)(1) of the Act, 21 U.S.C.section 360bbb-3(b)(1), unless  the authorization is terminated  or revoked sooner.       Influenza A by PCR NEGATIVE NEGATIVE Final   Influenza B by PCR NEGATIVE NEGATIVE Final    Comment: (NOTE) The Xpert Xpress SARS-CoV-2/FLU/RSV plus assay is intended as an aid in the diagnosis of influenza from Nasopharyngeal swab specimens and should not be used as a sole basis for treatment. Nasal washings and aspirates are unacceptable for Xpert Xpress SARS-CoV-2/FLU/RSV testing.  Fact Sheet for Patients: EntrepreneurPulse.com.au  Fact Sheet for Healthcare Providers: IncredibleEmployment.be  This test is not yet approved or cleared by the [place] FDA and has been authorized for detection and/or diagnosis of SARS-CoV-2 by FDA under an Emergency Use Authorization (EUA). This EUA will remain in effect (meaning this test can be used) for the duration of  the COVID-19 declaration under Section 564(b)(1) of the Act, 21 U.S.C. section 360bbb-3(b)(1), unless the authorization is terminated or revoked.     Resp Syncytial Virus by PCR NEGATIVE NEGATIVE Final    Comment: (NOTE) Fact Sheet for Patients: EntrepreneurPulse.com.au  Fact Sheet for Healthcare Providers: IncredibleEmployment.be  This test is not yet approved or cleared by the [place] FDA and has been authorized for detection and/or diagnosis of SARS-CoV-2 by FDA under an Emergency Use Authorization (EUA). This EUA will remain in effect (meaning this test can be used) for the duration of the COVID-19 declaration under Section 564(b)(1) of the Act, 21 U.S.C. section 360bbb-3(b)(1), unless the authorization is terminated or revoked.  Performed at [facility], [address] [place], [place] [address]      Labs: BNP (last 3 results) No results for input(s): "BNP" in the last 8760 hours. Basic Metabolic Panel: Recent Labs  Lab 01/30/23 1321 01/31/23 0528  NA 135 137  K 3.5 4.2  CL 99 105  CO2 27 25  GLUCOSE 442* 320*  BUN 12 9  CREATININE 0.59 0.53  CALCIUM 8.8* 8.8*   Liver Function Tests: Recent Labs  Lab 01/30/23 1321  AST 23  ALT 30  ALKPHOS 80  BILITOT 0.6  PROT 6.4*  ALBUMIN 3.5   No results for input(s): "LIPASE", "AMYLASE" in the last 168 hours. No results for input(s): "AMMONIA" in the last 168 hours. CBC: Recent Labs  Lab 01/30/23 1321 01/31/23 0528  WBC 8.4 7.6  HGB 13.8 14.0  HCT 42.3 43.4  MCV 88.7 87.5  PLT 248 263   Cardiac Enzymes: No results for input(s): "CKTOTAL", "CKMB", "CKMBINDEX", "TROPONINI" in the last 168 hours. BNP: Invalid input(s): "POCBNP" CBG: Recent Labs  Lab 01/31/23 0731 01/31/23 1158 01/31/23 1621 01/31/23 2026 02/01/23 0738  GLUCAP 314* 335* 322* 356* 314*   D-Dimer No results for input(s): "DDIMER" in the last 72 hours. Hgb A1c Recent Labs     01/31/23 0528  HGBA1C 11.0*   Lipid Profile No results for input(s): "CHOL", "HDL", "LDLCALC", "TRIG", "CHOLHDL", "LDLDIRECT" in the last 72 hours. Thyroid function studies No results for input(s): "TSH", "T4TOTAL", "T3FREE", "THYROIDAB" in the last 72 hours.  Invalid input(s): "FREET3" Anemia work up No results for input(s): "VITAMINB12", "FOLATE", "FERRITIN", "TIBC", "IRON", "RETICCTPCT" in the last 72 hours. Urinalysis No results found for: "COLORURINE", "APPEARANCEUR", "LABSPEC", "PHURINE", "GLUCOSEU", "HGBUR", "BILIRUBINUR", "KETONESUR", "PROTEINUR", "UROBILINOGEN", "NITRITE", "LEUKOCYTESUR" Sepsis Labs Recent Labs  Lab 01/30/23 1321 01/31/23 0528  WBC 8.4 7.6   Microbiology Recent Results (from the past 240 hour(s))  Resp panel by RT-PCR (RSV, Flu A&B, Covid) Anterior Nasal Swab     Status: None  Collection Time: 01/30/23  1:25 PM   Specimen: Anterior Nasal Swab  Result Value Ref Range Status   SARS Coronavirus 2 by RT PCR NEGATIVE NEGATIVE Final    Comment: (NOTE) SARS-CoV-2 target nucleic acids are NOT DETECTED.  The SARS-CoV-2 RNA is generally detectable in upper respiratory specimens during the acute phase of infection. The lowest concentration of SARS-CoV-2 viral copies this assay can detect is 138 copies/mL. A negative result does not preclude SARS-Cov-2 infection and should not be used as the sole basis for treatment or other patient management decisions. A negative result may occur with  improper specimen collection/handling, submission of specimen other than nasopharyngeal swab, presence of viral mutation(s) within the areas targeted by this assay, and inadequate number of viral copies(<138 copies/mL). A negative result must be combined with clinical observations, patient history, and epidemiological information. The expected result is Negative.  Fact Sheet for Patients:  EntrepreneurPulse.com.au  Fact Sheet for Healthcare Providers:   IncredibleEmployment.be  This test is no t yet approved or cleared by the [place] FDA and  has been authorized for detection and/or diagnosis of SARS-CoV-2 by FDA under an Emergency Use Authorization (EUA). This EUA will remain  in effect (meaning this test can be used) for the duration of the COVID-19 declaration under Section 564(b)(1) of the Act, 21 U.S.C.section 360bbb-3(b)(1), unless the authorization is terminated  or revoked sooner.       Influenza A by PCR NEGATIVE NEGATIVE Final   Influenza B by PCR NEGATIVE NEGATIVE Final    Comment: (NOTE) The Xpert Xpress SARS-CoV-2/FLU/RSV plus assay is intended as an aid in the diagnosis of influenza from Nasopharyngeal swab specimens and should not be used as a sole basis for treatment. Nasal washings and aspirates are unacceptable for Xpert Xpress SARS-CoV-2/FLU/RSV testing.  Fact Sheet for Patients: EntrepreneurPulse.com.au  Fact Sheet for Healthcare Providers: IncredibleEmployment.be  This test is not yet approved or cleared by the [place] FDA and has been authorized for detection and/or diagnosis of SARS-CoV-2 by FDA under an Emergency Use Authorization (EUA). This EUA will remain in effect (meaning this test can be used) for the duration of the COVID-19 declaration under Section 564(b)(1) of the Act, 21 U.S.C. section 360bbb-3(b)(1), unless the authorization is terminated or revoked.     Resp Syncytial Virus by PCR NEGATIVE NEGATIVE Final    Comment: (NOTE) Fact Sheet for Patients: EntrepreneurPulse.com.au  Fact Sheet for Healthcare Providers: IncredibleEmployment.be  This test is not yet approved or cleared by the [place] FDA and has been authorized for detection and/or diagnosis of SARS-CoV-2 by FDA under an Emergency Use Authorization (EUA). This EUA will remain in effect (meaning this test can be used) for  the duration of the COVID-19 declaration under Section 564(b)(1) of the Act, 21 U.S.C. section 360bbb-3(b)(1), unless the authorization is terminated or revoked.  Performed at [facility], [address] [place], [place] [address]      Time coordinating discharge: Over 30 minutes  SIGNED:   Sidney Ace, MD  [facility] 02/01/2023, 1:40 PM Pager   If 7PM-7AM, please contact night-coverage

## 2023-02-01 NOTE — Inpatient Diabetes Management (Signed)
Inpatient Diabetes Program Recommendations  AACE/ADA: New Consensus Statement on Inpatient Glycemic Control (2015)  Target Ranges:  Prepandial:   less than 140 mg/dL      Peak postprandial:   less than 180 mg/dL (1-2 hours)      Critically ill patients:  140 - 180 mg/dL   Lab Results  Component Value Date   GLUCAP 314 (H) 02/01/2023   HGBA1C 11.0 (H) 01/31/2023    Review of Glycemic Control  Latest Reference Range & Units 01/31/23 07:31 01/31/23 11:58 01/31/23 16:21 01/31/23 20:26 02/01/23 07:38  Glucose-Capillary 70 - 99 mg/dL 314 (H) 335 (H) 322 (H) 356 (H) 314 (H)  (H): Data is abnormally high  Diabetes history: DM2 Outpatient Diabetes medications:  Lantus 15 QD Novolin R SSI TID Janumet 50-500 mg QD Glipized 10 mg BID   Current orders for Inpatient glycemic control:  Semglee 20 units QD Novolog 0-20 units TID and 0-5 units QHS Novolog 6 units TID with meals    Inpatient Diabetes Program Recommendations:     While on steroids, please consider Semglee 20 units BID   Will continue to follow while inpatient.   Thank you, Reche Dixon, MSN, [place] [facility] Coordinator Inpatient Diabetes Program [phone] (team pager from 8a-5p)

## 2023-02-01 NOTE — Progress Notes (Signed)
Discharge instructions reviewed with patient including followup visits and new medications.  Understanding was verbalized and all questions were answered.  IV removed without complication; patient tolerated well.  Patient discharged home via wheelchair in stable condition escorted by volunteer staff.  

## 2023-03-19 ENCOUNTER — Other Ambulatory Visit: Payer: Self-pay | Admitting: *Deleted

## 2023-03-19 DIAGNOSIS — C50512 Malignant neoplasm of lower-outer quadrant of left female breast: Secondary | ICD-10-CM

## 2023-03-19 NOTE — Progress Notes (Signed)
Ca

## 2023-03-20 ENCOUNTER — Inpatient Hospital Stay: Payer: Medicare Other | Attending: Oncology

## 2023-03-20 DIAGNOSIS — Z853 Personal history of malignant neoplasm of breast: Secondary | ICD-10-CM | POA: Insufficient documentation

## 2023-03-20 DIAGNOSIS — C50512 Malignant neoplasm of lower-outer quadrant of left female breast: Secondary | ICD-10-CM

## 2023-03-21 LAB — CANCER ANTIGEN 27.29: CA 27.29: 19.2 U/mL (ref 0.0–38.6)

## 2023-03-22 ENCOUNTER — Inpatient Hospital Stay (HOSPITAL_BASED_OUTPATIENT_CLINIC_OR_DEPARTMENT_OTHER): Payer: Medicare Other | Admitting: Oncology

## 2023-03-22 DIAGNOSIS — Z87891 Personal history of nicotine dependence: Secondary | ICD-10-CM

## 2023-03-22 DIAGNOSIS — C50512 Malignant neoplasm of lower-outer quadrant of left female breast: Secondary | ICD-10-CM

## 2023-03-22 DIAGNOSIS — Z9013 Acquired absence of bilateral breasts and nipples: Secondary | ICD-10-CM | POA: Diagnosis not present

## 2023-03-22 DIAGNOSIS — Z171 Estrogen receptor negative status [ER-]: Secondary | ICD-10-CM

## 2023-03-22 NOTE — Progress Notes (Signed)
[facility]  [phone] Fax:([phone]  ID: Autumn Ramos OB: Jun 15, 1956  MR#: [id number]  AOZ#:[id number]  Patient Care Team: Estell Harpin, MD as PCP - General (Family Medicine)  I connected with Bismah Weintraub Lebeck on 03/22/23 at  2:45 PM EDT by video enabled telemedicine visit and verified that I am speaking with the correct person using two identifiers.   I discussed the limitations, risks, security and privacy concerns of performing an evaluation and management service by telemedicine and the availability of in-person appointments. I also discussed with the patient that there may be a patient responsible charge related to this service. The patient expressed understanding and agreed to proceed.   Other persons participating in the visit and their role in the encounter: Patient, MD.  Patient's location: Home. Provider's location: Clinic.   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    INTERVAL HISTORY: Patient agreed to video assisted telemedicine visit for routine yearly evaluation.  She continues to feel well and remains asymptomatic.  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 She has no neurologic complaints.  She does not complain of pain today.  She denies any recent fevers or illnesses. She has a good appetite and denies weight loss.  She denies any chest pain, shortness of breath, cough, or hemoptysis.  She denies any nausea, vomiting, constipation, or diarrhea. She has no urinary complaints.  Patient offers no specific complaints today.    REVIEW OF SYSTEMS:   Review of Systems  Constitutional: Negative.  Negative for fever, malaise/fatigue and weight loss.  Respiratory: Negative.  Negative for cough and shortness of breath.   Cardiovascular: Negative.  Negative for chest pain and leg swelling.  Gastrointestinal: Negative.  Negative for  abdominal pain and constipation.  Genitourinary: Negative.  Negative for dysuria.  Musculoskeletal: Negative.  Negative for back pain.  Skin: Negative.  Negative for rash.  Neurological: Negative.  Negative for sensory change, focal weakness, weakness and headaches.  Psychiatric/Behavioral: Negative.  The patient is not nervous/anxious.     As per HPI. Otherwise, a complete review of systems is negative.   PAST MEDICAL HISTORY: Past Medical History:  Diagnosis Date   Anemia    Anxiety    Breast cancer (HCC)    Complication of anesthesia    difficult to wake up, very sensitive to medications   COPD (chronic obstructive pulmonary disease) (HCC)    Diabetes mellitus without complication (HCC)    Family history of adverse reaction to anesthesia    mother difficult to wake up   GERD (gastroesophageal reflux disease)    H/O breast implant    Hypertension    Hypothyroidism    Last menstrual period (LMP) > 10 days ago 10/30/2004   Pneumonia     PAST SURGICAL HISTORY: Past Surgical History:  Procedure Laterality Date   BREAST ENHANCEMENT SURGERY     BREAST SURGERY     c sections x 2     DILATATION & CURETTAGE/HYSTEROSCOPY WITH MYOSURE N/A 09/11/2022   Procedure: FRACTIONAL DILATATION & CURETTAGE/HYSTEROSCOPY WITH MYOSURE, RESECTION OF POLYP;  Surgeon: Schermerhorn, Ihor Austin, MD;  Location: [facility] ORS;  Service: Gynecology;  Laterality: N/A;   DILATION AND CURETTAGE OF UTERUS     MASTECTOMY     TONSILLECTOMY      FAMILY HISTORY Family History  Problem Relation Age of Onset   Coronary artery disease Father  Hypertension Father    Coronary artery disease Mother    Hypertension Mother    Diabetes Mother        ADVANCED DIRECTIVES:    HEALTH MAINTENANCE: Social History   Tobacco Use   Smoking status: Former    Packs/day: 0.25    Years: 25.00    Additional pack years: 0.00    Total pack years: 6.25    Types: Cigarettes    Quit date: 06/14/2014    Years since  quitting: 8.7   Smokeless tobacco: Never  Vaping Use   Vaping Use: Never used  Substance Use Topics   Alcohol use: Yes    Comment: rare   Drug use: Yes    Comment: prescribed hydrocodone    Allergies  Allergen Reactions   Acetylcysteine Shortness Of Breath   Tramadol Itching   Codeine Itching and Nausea And Vomiting    Other Reaction(s): vomiting    Current Outpatient Medications  Medication Sig Dispense Refill   Accu-Chek Softclix Lancets lancets Accu-Chek Softclix Lancets     albuterol (PROVENTIL HFA;VENTOLIN HFA) 108 (90 Base) MCG/ACT inhaler Inhale 1-2 puffs into the lungs every 4 (four) hours as needed for wheezing or shortness of breath.     ALPRAZolam (XANAX) 0.25 MG tablet as needed. For procedure     atorvastatin (LIPITOR) 80 MG tablet Take 80 mg by mouth at bedtime.  0   Cholecalciferol (VITAMIN D3) 50 MCG (2000 UT) capsule Take 2,000 Units by mouth daily.     EPINEPHrine 0.3 mg/0.3 mL IJ SOAJ injection epinephrine 0.3 mg/0.3 mL injection, auto-injector     ezetimibe (ZETIA) 10 MG tablet Take 10 mg by mouth daily.     gabapentin (NEURONTIN) 300 MG capsule Take 2 capsules by mouth at bedtime.     glipiZIDE (GLUCOTROL) 10 MG tablet Take 10 mg by mouth daily before breakfast.     glucose blood test strip OneTouch Ultra Blue Test Strip  TEST BLOOD GLUCOSE LEVELS TWICE DAILY     HYDROcodone-acetaminophen (NORCO/VICODIN) 5-325 MG tablet Take by mouth.     ibuprofen (ADVIL,MOTRIN) 800 MG tablet Take 800 mg by mouth every 6 (six) hours as needed for mild pain or moderate pain.      insulin glargine (LANTUS) 100 UNIT/ML injection Inject 15 Units into the skin at bedtime.     insulin regular (NOVOLIN R) 100 units/mL injection Inject into the skin 3 (three) times daily before meals. SSI     ipratropium-albuterol (DUONEB) 0.5-2.5 (3) MG/3ML SOLN Take 3 mLs by nebulization every 6 (six) hours as needed.     ipratropium-albuterol (DUONEB) 0.5-2.5 (3) MG/3ML SOLN Take 3 mLs by  nebulization every 4 (four) hours as needed. 360 mL 0   JANUMET XR 50-500 MG TB24 Take 1 tablet by mouth daily.     levothyroxine (SYNTHROID, LEVOTHROID) 100 MCG tablet Take 100 mcg by mouth daily before breakfast.     lisinopril (PRINIVIL,ZESTRIL) 10 MG tablet Take 1 tablet by mouth daily.     montelukast (SINGULAIR) 10 MG tablet Take 10 mg by mouth at bedtime.     ondansetron (ZOFRAN) 8 MG tablet Take 8 mg by mouth every 8 (eight) hours as needed for nausea or vomiting.      OZEMPIC, 0.25 OR 0.5 MG/DOSE, 2 MG/3ML SOPN      pantoprazole (PROTONIX) 40 MG tablet Take 40 mg by mouth daily.     progesterone (PROMETRIUM) 100 MG capsule Take 100 mg by mouth daily.     TRELEGY  ELLIPTA 200-62.5-25 MCG/ACT AEPB Inhale 1 puff into the lungs daily.     benzonatate (TESSALON) 200 MG capsule Take 1 capsule (200 mg total) by mouth 3 (three) times daily. (Patient not taking: Reported on 03/22/2023) 20 capsule 0   metoprolol succinate (TOPROL-XL) 25 MG 24 hr tablet Take 1 tablet by mouth daily.     promethazine-dextromethorphan (PROMETHAZINE-DM) 6.25-15 MG/5ML syrup Take 5 mLs by mouth every 6 (six) hours as needed for cough. (Patient not taking: Reported on 03/22/2023)     No current facility-administered medications for this visit.    OBJECTIVE: There were no vitals filed for this visit.    There is no height or weight on file to calculate BMI.    ECOG FS:0 - Asymptomatic  General: Well-developed, well-nourished, no acute distress. HEENT: Normocephalic. Neuro: Alert, answering all questions appropriately. Cranial nerves grossly intact. Psych: Normal affect.   LAB RESULTS:  Lab Results  Component Value Date   NA 137 01/31/2023   K 4.2 01/31/2023   CL 105 01/31/2023   CO2 25 01/31/2023   GLUCOSE 320 (H) 01/31/2023   BUN 9 01/31/2023   CREATININE 0.53 01/31/2023   CALCIUM 8.8 (L) 01/31/2023   PROT 6.4 (L) 01/30/2023   ALBUMIN 3.5 01/30/2023   AST 23 01/30/2023   ALT 30 01/30/2023   ALKPHOS 80  01/30/2023   BILITOT 0.6 01/30/2023   GFRNONAA >60 01/31/2023   GFRAA >60 03/09/2016    Lab Results  Component Value Date   WBC 7.6 01/31/2023   NEUTROABS 8.0 (H) 02/27/2016   HGB 14.0 01/31/2023   HCT 43.4 01/31/2023   MCV 87.5 01/31/2023   PLT 263 01/31/2023     STUDIES: No results found.  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  Patient initial diagnosis was in November 2015.  She subsequently underwent bilateral mastectomy with implants on November 20, 2014 at [facility].  She did not require adjuvant XRT.  Given the triple negative status of her cancer, she did not require treatment with an aromatase inhibitor. The benefit of tamoxifen treatment for her LCIS was questionable after mastectomy.  Patient's CA 27-29 continues to be within normal limits at 19.2.  No intervention is needed at this time.  Return to clinic in 1 year with video-assisted telemedicine visit for routine evaluation.  Will continue to follow patient for a total of 10 years after her surgery.   2.  Breast implants/reconstruction: Patient has now completed all of her surgeries.  I provided 20 minutes of face-to-face video visit time during this encounter which included chart review, counseling, and coordination of care as documented above.   Patient expressed understanding and was in agreement with this plan. She also understands that She can call clinic at any time with any questions, concerns, or complaints.   Breast cancer   Staging form: Breast, AJCC 7th Edition     Clinical stage from 05/03/2015: Stage IA (T1b, N0, M0) - Signed by Jeralyn Ruths, MD on 05/03/2015   Jeralyn Ruths,  MD   03/22/2023 3:46 PM

## 2023-04-01 ENCOUNTER — Other Ambulatory Visit: Payer: Self-pay

## 2023-04-01 ENCOUNTER — Emergency Department
Admission: EM | Admit: 2023-04-01 | Discharge: 2023-04-01 | Disposition: A | Discharge status: Home / Self Care | Payer: Medicare Other | Attending: Emergency Medicine | Admitting: Emergency Medicine

## 2023-04-01 ENCOUNTER — Emergency Department: Payer: Medicare Other

## 2023-04-01 DIAGNOSIS — R079 Chest pain, unspecified: Secondary | ICD-10-CM

## 2023-04-01 DIAGNOSIS — I10 Essential (primary) hypertension: Secondary | ICD-10-CM | POA: Diagnosis not present

## 2023-04-01 DIAGNOSIS — J449 Chronic obstructive pulmonary disease, unspecified: Secondary | ICD-10-CM | POA: Insufficient documentation

## 2023-04-01 DIAGNOSIS — E119 Type 2 diabetes mellitus without complications: Secondary | ICD-10-CM | POA: Insufficient documentation

## 2023-04-01 LAB — CBC
HCT: 41.4 % (ref 36.0–46.0)
Hemoglobin: 13.4 g/dL (ref 12.0–15.0)
MCH: 28.6 pg (ref 26.0–34.0)
MCHC: 32.4 g/dL (ref 30.0–36.0)
MCV: 88.5 fL (ref 80.0–100.0)
Platelets: 292 10*3/uL (ref 150–400)
RBC: 4.68 MIL/uL (ref 3.87–5.11)
RDW: 13 % (ref 11.5–15.5)
WBC: 9.6 10*3/uL (ref 4.0–10.5)
nRBC: 0 % (ref 0.0–0.2)

## 2023-04-01 LAB — TROPONIN I (HIGH SENSITIVITY)
Troponin I (High Sensitivity): 5 ng/L (ref <18)
Troponin I (High Sensitivity): 5 ng/L (ref <18)

## 2023-04-01 LAB — BASIC METABOLIC PANEL
Anion gap: 7 (ref 5–15)
BUN: 11 mg/dL (ref 8–23)
CO2: 27 mmol/L (ref 22–32)
Calcium: 9.1 mg/dL (ref 8.9–10.3)
Chloride: 103 mmol/L (ref 98–111)
Creatinine, Ser: 0.61 mg/dL (ref 0.44–1.00)
GFR, Estimated: >60 mL/min (ref >60)
Glucose, Bld: 257 mg/dL — ABNORMAL HIGH (ref 70–99)
Potassium: 3.7 mmol/L (ref 3.5–5.1)
Sodium: 137 mmol/L (ref 135–145)

## 2023-04-01 MED ORDER — FAMOTIDINE 20 MG PO TABS
40.0000 mg | ORAL_TABLET | Freq: Once | ORAL | Status: AC
  Administered 2023-04-01: 40 mg via ORAL
  Filled 2023-04-01: qty 2

## 2023-04-01 MED ORDER — ALUM & MAG HYDROXIDE-SIMETH 200-200-20 MG/5ML PO SUSP
30.0000 mL | Freq: Once | ORAL | Status: AC
  Administered 2023-04-01: 30 mL via ORAL
  Filled 2023-04-01: qty 30

## 2023-04-01 NOTE — ED Provider Notes (Signed)
[facility] Provider Note    Event Date/Time   First MD Initiated Contact with Patient 04/01/23 1548     (approximate)   History   Chief Complaint: Chest Pain   HPI  Autumn Ramos is a 67 y.o. female with a history of hypertension diabetes COPD who comes ED complaining of left-sided chest pain that radiates to her arm and back that started about 11:00 AM today.  She has not eaten yet today.  She reports that she has a history of severe GERD and this is consistent with her gout attacks.  It felt more intense than usual today so she came to the ED for evaluation.  Onset during rest while driving.  No aggravating or alleviating factors.  No shortness of breath diaphoresis or vomiting.  No exertional symptoms.  Not pleuritic.  She has not had any exertional symptoms recently.  By the time she arrived in the ED treatment area, she reports the pain has nearly resolved and is 1/10 in intensity.     Physical Exam   Triage Vital Signs: ED Triage Vitals  Enc Vitals Group     BP 04/01/23 1213 (!) 153/71     Pulse Rate 04/01/23 1213 71     Resp 04/01/23 1213 16     Temp 04/01/23 1213 98.2 F (36.8 C)     Temp Source 04/01/23 1213 Oral     SpO2 04/01/23 1213 95 %     Weight 04/01/23 1210 185 lb (83.9 kg)     Height 04/01/23 1210 5' (1.524 m)     Head Circumference --      Peak Flow --      Pain Score 04/01/23 1209 7     Pain Loc --      Pain Edu? --      Excl. in GC? --     Most recent vital signs: Vitals:   04/01/23 1526 04/01/23 1600  BP: (!) 152/85 (!) 134/59  Pulse: 71 75  Resp: 18 17  Temp: 98.2 F (36.8 C)   SpO2: 97% 97%    General: Awake, no distress.  CV:  Good peripheral perfusion.  Regular rate rhythm Resp:  Normal effort.  Clear to auscultation bilaterally Abd:  No distention.  Soft nontender Other:  No lower extremity edema   ED Results / Procedures / Treatments   Labs (all labs ordered are listed, but only abnormal results  are displayed) Labs Reviewed  BASIC METABOLIC PANEL - Abnormal; Notable for the following components:      Result Value   Glucose, Bld 257 (*)    All other components within normal limits  CBC  TROPONIN I (HIGH SENSITIVITY)  TROPONIN I (HIGH SENSITIVITY)     EKG Interpreted by me Normal sinus rhythm rate of 75.  Normal axis, normal intervals.  Poor R wave progression.  Normal ST segments and T waves.  No ischemic changes.   RADIOLOGY Chest x-ray interpreted by me, appears normal.  Radiology report reviewed   PROCEDURES:  Procedures   MEDICATIONS ORDERED IN ED: Medications  alum & mag hydroxide-simeth (MAALOX/MYLANTA) 200-200-20 MG/5ML suspension 30 mL (30 mLs Oral Given 04/01/23 1621)  famotidine (PEPCID) tablet 40 mg (40 mg Oral Given 04/01/23 1620)     IMPRESSION / MDM / ASSESSMENT AND PLAN / ED COURSE  I reviewed the triage vital signs and the nursing notes.  DDx: GERD, spasm, non-STEMI, electrolyte abnormality  Patient's presentation is most consistent with acute presentation  with potential threat to life or bodily function.  Patient presents with atypical chest pain, feels like her GERD.  Initial troponin is normal.  Heart score is low risk.  Will give antacids and trend troponin.   Clinical Course as of 04/01/23 1847  [name] Apr 01, 2023  1631 Feeling better. Repeat trop normal. Stable for dc.  [PS]    Clinical Course User Index [PS] Sharman Cheek, MD     FINAL CLINICAL IMPRESSION(S) / ED DIAGNOSES   Final diagnoses:  Nonspecific chest pain     Rx / DC Orders   ED Discharge Orders     None        Note:  This document was prepared using Dragon voice recognition software and may include unintentional dictation errors.   Sharman Cheek, MD 04/01/23 [phone]

## 2023-04-01 NOTE — ED Triage Notes (Signed)
Pt to ED for L chest pain that radiates to L jaw, arm and back since 1 hour ago. Heavy, sharp. Hx GERD.  L arm restricted. Skin warm, dry.

## 2023-12-12 ENCOUNTER — Other Ambulatory Visit: Payer: Self-pay | Admitting: Medical Genetics

## 2023-12-13 ENCOUNTER — Other Ambulatory Visit
Admission: RE | Admit: 2023-12-13 | Discharge: 2023-12-13 | Disposition: A | Discharge status: Home / Self Care | Payer: Self-pay | Source: Ambulatory Visit | Attending: Medical Genetics | Admitting: Medical Genetics

## 2023-12-14 ENCOUNTER — Other Ambulatory Visit: Payer: Medicare Other

## 2023-12-26 LAB — GENECONNECT MOLECULAR SCREEN: Genetic Analysis Overall Interpretation: NEGATIVE

## 2024-03-21 ENCOUNTER — Inpatient Hospital Stay: Payer: Medicare Other | Attending: Oncology

## 2024-03-21 DIAGNOSIS — Z853 Personal history of malignant neoplasm of breast: Secondary | ICD-10-CM | POA: Insufficient documentation

## 2024-03-21 DIAGNOSIS — C50512 Malignant neoplasm of lower-outer quadrant of left female breast: Secondary | ICD-10-CM

## 2024-03-21 DIAGNOSIS — Z9013 Acquired absence of bilateral breasts and nipples: Secondary | ICD-10-CM | POA: Diagnosis not present

## 2024-03-22 LAB — CANCER ANTIGEN 27.29: CA 27.29: 12 U/mL (ref 0.0–38.6)

## 2024-03-25 ENCOUNTER — Inpatient Hospital Stay: Payer: Medicare Other | Admitting: Oncology

## 2024-03-25 DIAGNOSIS — Z853 Personal history of malignant neoplasm of breast: Secondary | ICD-10-CM | POA: Diagnosis not present

## 2024-03-25 DIAGNOSIS — Z9013 Acquired absence of bilateral breasts and nipples: Secondary | ICD-10-CM | POA: Diagnosis not present

## 2024-03-25 DIAGNOSIS — C50512 Malignant neoplasm of lower-outer quadrant of left female breast: Secondary | ICD-10-CM

## 2024-03-25 NOTE — Progress Notes (Signed)
 [facility]  Telephone:(336) [phone] Fax:(336) [phone]  ID: Autumn Ramos OB: April 06, 1956  MR#: [id number]  YQI#:[id number]  Patient Care Team: Lake Pilgrim, MD as PCP - General (Family Medicine) Shellie Dials, MD as Consulting Physician (Oncology)  I connected with Autumn Ramos on 03/25/24 at  3:30 PM EDT by video enabled telemedicine visit and verified that I am speaking with the correct person using two identifiers.   I discussed the limitations, risks, security and privacy concerns of performing an evaluation and management service by telemedicine and the availability of in-person appointments. I also discussed with the patient that there may be a patient responsible charge related to this service. The patient expressed understanding and agreed to proceed.   Other persons participating in the visit and their role in the encounter: Patient, MD.  Patient's location: Home. Provider's location: Clinic.  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    INTERVAL HISTORY: Patient agreed to video-assisted telemedicine visit for routine yearly evaluation and discussion of her laboratory results.  She continues to feel well and remains asymptomatic.  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  She has no neurologic complaints.  She does not complain of pain today.  She denies any recent fevers or illnesses. She has a good appetite and denies weight loss.  She denies any chest pain, shortness of breath, cough, or hemoptysis.  She denies any nausea, vomiting, constipation, or diarrhea. She has no urinary complaints.  Patient offers no specific complaints today.  REVIEW OF SYSTEMS:   Review of Systems  Constitutional: Negative.  Negative for fever, malaise/fatigue and weight loss.  Respiratory: Negative.  Negative for cough and shortness of breath.   Cardiovascular: Negative.   Negative for chest pain and leg swelling.  Gastrointestinal: Negative.  Negative for abdominal pain and constipation.  Genitourinary: Negative.  Negative for dysuria.  Musculoskeletal: Negative.  Negative for back pain.  Skin: Negative.  Negative for rash.  Neurological: Negative.  Negative for sensory change, focal weakness, weakness and headaches.  Psychiatric/Behavioral: Negative.  The patient is not nervous/anxious.     As per HPI. Otherwise, a complete review of systems is negative.   PAST MEDICAL HISTORY: Past Medical History:  Diagnosis Date   Anemia    Anxiety    Breast cancer (HCC)    Complication of anesthesia    difficult to wake up, very sensitive to medications   COPD (chronic obstructive pulmonary disease) (HCC)    Diabetes mellitus without complication (HCC)    Family history of adverse reaction to anesthesia    mother difficult to wake up   GERD (gastroesophageal reflux disease)    H/O breast implant    Hypertension    Hypothyroidism    Last menstrual period (LMP) > 10 days ago 10/30/2004   Pneumonia     PAST SURGICAL HISTORY: Past Surgical History:  Procedure Laterality Date   BREAST ENHANCEMENT SURGERY     BREAST SURGERY     c sections x 2     DILATATION & CURETTAGE/HYSTEROSCOPY WITH MYOSURE N/A 09/11/2022   Procedure: FRACTIONAL DILATATION & CURETTAGE/HYSTEROSCOPY WITH MYOSURE, RESECTION OF POLYP;  Surgeon: Schermerhorn, Joselyn Nicely, MD;  Location: [facility] ORS;  Service: Gynecology;  Laterality: N/A;   DILATION AND CURETTAGE OF UTERUS     MASTECTOMY     TONSILLECTOMY      FAMILY HISTORY Family History  Problem Relation Age of  Onset   Coronary artery disease Father    Hypertension Father    Coronary artery disease Mother    Hypertension Mother    Diabetes Mother        ADVANCED DIRECTIVES:    HEALTH MAINTENANCE: Social History   Tobacco Use   Smoking status: Former    Current packs/day: 0.00    Average packs/day: 0.3 packs/day for 25.0  years (6.3 ttl pk-yrs)    Types: Cigarettes    Start date: 06/14/1989    Quit date: 06/14/2014    Years since quitting: 9.7   Smokeless tobacco: Never  Vaping Use   Vaping status: Never Used  Substance Use Topics   Alcohol use: Yes    Comment: rare   Drug use: Yes    Comment: prescribed hydrocodone     Allergies  Allergen Reactions   Acetylcysteine Shortness Of Breath   Tramadol Itching   Codeine Itching and Nausea And Vomiting    Other Reaction(s): vomiting    Current Outpatient Medications  Medication Sig Dispense Refill   Accu-Chek Softclix Lancets lancets Accu-Chek Softclix Lancets     albuterol  (PROVENTIL  HFA;VENTOLIN  HFA) 108 (90 Base) MCG/ACT inhaler Inhale 1-2 puffs into the lungs every 4 (four) hours as needed for wheezing or shortness of breath.     ALPRAZolam (XANAX) 0.25 MG tablet as needed. For procedure     atorvastatin  (LIPITOR) 80 MG tablet Take 80 mg by mouth at bedtime.  0   benzonatate  (TESSALON ) 200 MG capsule Take 1 capsule (200 mg total) by mouth 3 (three) times daily. (Patient not taking: Reported on 03/22/2023) 20 capsule 0   Cholecalciferol  (VITAMIN D3) 50 MCG (2000 UT) capsule Take 2,000 Units by mouth daily.     EPINEPHrine 0.3 mg/0.3 mL IJ SOAJ injection epinephrine 0.3 mg/0.3 mL injection, auto-injector     ezetimibe  (ZETIA ) 10 MG tablet Take 10 mg by mouth daily.     gabapentin  (NEURONTIN ) 300 MG capsule Take 2 capsules by mouth at bedtime.     glipiZIDE  (GLUCOTROL ) 10 MG tablet Take 10 mg by mouth daily before breakfast.     glucose blood test strip OneTouch Ultra Blue Test Strip  TEST BLOOD GLUCOSE LEVELS TWICE DAILY     HYDROcodone -acetaminophen  (NORCO/VICODIN) 5-325 MG tablet Take by mouth.     ibuprofen  (ADVIL ,MOTRIN ) 800 MG tablet Take 800 mg by mouth every 6 (six) hours as needed for mild pain or moderate pain.      insulin  glargine (LANTUS ) 100 UNIT/ML injection Inject 15 Units into the skin at bedtime.     insulin  regular (NOVOLIN R) 100  units/mL injection Inject into the skin 3 (three) times daily before meals. SSI     ipratropium-albuterol  (DUONEB) 0.5-2.5 (3) MG/3ML SOLN Take 3 mLs by nebulization every 6 (six) hours as needed.     ipratropium-albuterol  (DUONEB) 0.5-2.5 (3) MG/3ML SOLN Take 3 mLs by nebulization every 4 (four) hours as needed. 360 mL 0   JANUMET XR 50-500 MG TB24 Take 1 tablet by mouth daily.     levothyroxine  (SYNTHROID , LEVOTHROID) 100 MCG tablet Take 100 mcg by mouth daily before breakfast.     lisinopril  (PRINIVIL ,ZESTRIL ) 10 MG tablet Take 1 tablet by mouth daily.     metoprolol  succinate (TOPROL -XL) 25 MG 24 hr tablet Take 1 tablet by mouth daily.     montelukast  (SINGULAIR ) 10 MG tablet Take 10 mg by mouth at bedtime.     ondansetron  (ZOFRAN ) 8 MG tablet Take 8 mg by mouth every 8 (  eight) hours as needed for nausea or vomiting.      OZEMPIC, 0.25 OR 0.5 MG/DOSE, 2 MG/3ML SOPN      pantoprazole  (PROTONIX ) 40 MG tablet Take 40 mg by mouth daily.     progesterone  (PROMETRIUM ) 100 MG capsule Take 100 mg by mouth daily.     promethazine-dextromethorphan  (PROMETHAZINE-DM) 6.25-15 MG/5ML syrup Take 5 mLs by mouth every 6 (six) hours as needed for cough. (Patient not taking: Reported on 03/22/2023)     TRELEGY ELLIPTA 200-62.5-25 MCG/ACT AEPB Inhale 1 puff into the lungs daily.     No current facility-administered medications for this visit.    OBJECTIVE: There were no vitals filed for this visit.    There is no height or weight on file to calculate BMI.    ECOG FS:0 - Asymptomatic  General: Well-developed, well-nourished, no acute distress. HEENT: Normocephalic. Neuro: Alert, answering all questions appropriately. Cranial nerves grossly intact. Psych: Normal affect.  LAB RESULTS:  Lab Results  Component Value Date   NA 137 04/01/2023   K 3.7 04/01/2023   CL 103 04/01/2023   CO2 27 04/01/2023   GLUCOSE 257 (H) 04/01/2023   BUN 11 04/01/2023   CREATININE 0.61 04/01/2023   CALCIUM  9.1 04/01/2023    PROT 6.4 (L) 01/30/2023   ALBUMIN 3.5 01/30/2023   AST 23 01/30/2023   ALT 30 01/30/2023   ALKPHOS 80 01/30/2023   BILITOT 0.6 01/30/2023   GFRNONAA >60 04/01/2023   GFRAA >60 03/09/2016    Lab Results  Component Value Date   WBC 9.6 04/01/2023   NEUTROABS 8.0 (H) 02/27/2016   HGB 13.4 04/01/2023   HCT 41.4 04/01/2023   MCV 88.5 04/01/2023   PLT 292 04/01/2023     STUDIES: No results found.  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  Patient initial diagnosis was in November 2015.  She subsequently underwent bilateral mastectomy with implants on November 20, 2014 at [facility].  She did not require adjuvant XRT.  Given the triple negative status of her cancer, she did not require treatment with an aromatase inhibitor. The benefit of tamoxifen treatment for her LCIS was questionable after mastectomy.  Patient's CA 27-29 continues to be within normal limits at 12.0.  No intervention is needed at this time.  Return to clinic in 1 year with video-assisted telemedicine visit at which point patient can likely be discharged from clinic.   Breast implants/reconstruction: Patient has now completed all of her surgeries.   I provided 20 minutes of face-to-face video visit time during this encounter which included chart review, counseling, and coordination of care as documented above.   Patient expressed understanding and was in agreement with this plan. She also understands that She can call clinic at any time with any questions, concerns, or complaints.   Breast cancer   Staging form: Breast, AJCC 7th Edition     Clinical stage from 05/03/2015: Stage IA (T1b, N0, M0) -  Signed by Shellie Dials, MD on 05/03/2015   Shellie Dials, MD   03/25/2024 3:54 PM

## 2024-07-08 ENCOUNTER — Other Ambulatory Visit: Payer: Self-pay | Admitting: Otolaryngology

## 2024-07-08 DIAGNOSIS — E042 Nontoxic multinodular goiter: Secondary | ICD-10-CM

## 2024-07-10 ENCOUNTER — Ambulatory Visit
Admission: RE | Admit: 2024-07-10 | Discharge: 2024-07-10 | Disposition: A | Discharge status: Home / Self Care | Source: Ambulatory Visit | Attending: Otolaryngology | Admitting: Otolaryngology

## 2024-07-10 DIAGNOSIS — E042 Nontoxic multinodular goiter: Secondary | ICD-10-CM

## 2024-09-10 ENCOUNTER — Ambulatory Visit: Attending: Otolaryngology | Admitting: Speech Pathology

## 2024-09-10 DIAGNOSIS — R490 Dysphonia: Secondary | ICD-10-CM | POA: Insufficient documentation

## 2024-09-10 NOTE — Therapy (Signed)
 OUTPATIENT SPEECH LANGUAGE PATHOLOGY  VOICE EVALUATION   Patient Name: Autumn Ramos MRN: [id number] DOB:May 01, 1956, 68 y.o., female Today's Date: 09/10/2024  PCP: Velva Calkins, MD REFERRING PROVIDER: Deward Dolly, MD   End of Session - 09/10/24 0957     Visit Number 1    Number of Visits 25    Date for Recertification  12/03/24    Authorization Type Medicare Part A/Medicare Part B; [employer]    Progress Note Due on Visit 10    SLP Start Time [phone]    SLP Stop Time  1015    SLP Time Calculation (min) 40 min    Activity Tolerance Patient tolerated treatment well          Past Medical History:  Diagnosis Date   Anemia    Anxiety    Breast cancer (HCC)    Complication of anesthesia    difficult to wake up, very sensitive to medications   COPD (chronic obstructive pulmonary disease) (HCC)    Diabetes mellitus without complication (HCC)    Family history of adverse reaction to anesthesia    mother difficult to wake up   GERD (gastroesophageal reflux disease)    H/O breast implant    Hypertension    Hypothyroidism    Last menstrual period (LMP) > 10 days ago 10/30/2004   Pneumonia    Past Surgical History:  Procedure Laterality Date   BREAST ENHANCEMENT SURGERY     BREAST SURGERY     c sections x 2     DILATATION & CURETTAGE/HYSTEROSCOPY WITH MYOSURE N/A 09/11/2022   Procedure: FRACTIONAL DILATATION & CURETTAGE/HYSTEROSCOPY WITH MYOSURE, RESECTION OF POLYP;  Surgeon: Schermerhorn, Debby PARAS, MD;  Location: [facility] ORS;  Service: Gynecology;  Laterality: N/A;   DILATION AND CURETTAGE OF UTERUS     MASTECTOMY     TONSILLECTOMY     Patient Active Problem List   Diagnosis Date Noted   COPD exacerbation (HCC) 01/30/2023   Chronic narcotic use 01/30/2023   Uncontrolled type 2 diabetes mellitus with hyperglycemia, with long-term current use of insulin  (HCC) 01/30/2023   High risk medication use 11/10/2020   Allergic rhinitis 03/09/2020   Chronic obstructive  lung disease (HCC) 03/09/2020   Constipation 03/09/2020   Hypercholesterolemia 03/09/2020   Gastroesophageal reflux disease 03/09/2020   Hypothyroidism 03/09/2020   Migraine 03/09/2020   Obesity 03/09/2020   Tobacco user 03/09/2020   Acquired absence of both breasts 01/16/2018   Acute bronchitis due to infection 03/08/2016   Neoplasm of left breast, 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) 03/06/2016   Primary cancer of lower-outer quadrant of left breast (HCC) 05/03/2015   Infiltrating ductal carcinoma of right breast, stage 1 (HCC) 10/26/2014   Atypical chest pain 09/23/2014   Hyperlipidemia 09/23/2014   Hypertension 09/23/2014   Major depressive disorder 06/12/2011   Poisoning by drug or medicinal substance 06/11/2011    ONSET DATE:  ~ 5 years ago, date of referral 09/03/2024  REFERRING DIAG: R49.0 (ICD-10-CM) - Hoarseness   THERAPY DIAG:  Dysphonia  Rationale for Evaluation and Treatment Rehabilitation  SUBJECTIVE:   SUBJECTIVE STATEMENT: Pt pleasant, good historian, eager, talkative Pt accompanied by: self  PERTINENT HISTORY: Pt is a 68 year old female with medical history of breast cancer, diabetes, COPD, reflux.   DIAGNOSTIC FINDINGS:  Laryngoscopy 09/03/2024 The nasal cavity is congested with clear secretions. Congestion consistent with allergies. She declined nasal steroid spray.  Vocal cords are mobile with senile atrophy of  the cords, but no mucosal lesions. There is some mild mucosal edema. 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 Given the senile atrophy of the cords, would recommend voice therapy in addition to continued reflux management and hydration to offset the drying effects of her medications.  SABRA    PAIN:  Are you having pain? No   FALLS: Has patient fallen in last 6 months? No,   LIVING ENVIRONMENT: Lives with: lives alone Lives in: House/apartment  PLOF: Independent  PATIENT GOALS    to improve  voice, ability to talk  OBJECTIVE:  COGNITION: Overall cognitive status: Within functional limits for tasks assessed  SOCIAL HISTORY: Occupation: retired Counsellor intake: suboptimal Caffeine/alcohol intake: excessive - highly caffinated Daily voice use: excessive Environmental risks: Dry or dusty environment Occupational risks: None identified Misuse: Speaks excessively, Hyperfunction, Glottal fry, Glottal attack, Strain, Tension, Speaks without adequate breath support, and Speaks on residual capacity Phonotraumatic behaviors: Excessive voice use, Excessive voice use during colds/illnesses, and Excessive and/or habitual throat clearing  PERCEPTUAL VOICE ASSESSMENT: Voice quality: hoarse, breathy, harsh, rough, strained, low vocal intensity, diplophonia, vocal fatigue, and aphonic Vocal abuse: habitual throat clearing, excessive voice use, and habitual abnormal pitch Resonance: normal Respiratory function: thoracic breathing  OBJECTIVE VOICE ASSESSMENT: Sustained ah maximum phonation time: 7.7 seconds Sustained ah loudness average: 71 dB Average fundamental frequency during sustained "ah":252 Hz   (Mildly above average of  244 Hz +/- 27 for gender)  Oral reading (passage) loudness average: 72 dB Oral reading loudness range: 25 dB Conversational pitch average: 210 Hz Highest dynamic pitch in conversational speech: 277 Hz Lowest dynamic pitch in conversational speech: 177 Hz Conversational pitch range: 100 Hz Conversational loudness average: 70 dB Conversational loudness range: 17 dB    ORAL MOTOR EXAMINATION Facial : WFL Lingual: WFL Velum: WFL Mandible: WFL Cough: Non-Productive   PATIENT REPORTED OUTCOME MEASURES (PROM):  VOICE HANDICAP INDEX (VHI)  The Voice Handicap Index is comprised of a series of questions to assess the patient's perception of their voice. It is designed to evaluate the emotional, physical and functional components of the voice  problem.  Functional: 10 Physical: 23 Emotional: 10 Total: 43 (Normal mean 8.75, SD =14.97)  z score =  2.3 moderate = 2.00-2.99  TODAY'S TREATMENT:  Education provided on improving hydration, plan created to increase as well as reduced consumption of caffeine    PATIENT EDUCATION: Education details: results of this evaluation, ST POC Person educated: Patient Education method: Explanation Education comprehension: needs further education   HOME EXERCISE PROGRAM: Increase consumption of water in the morning, reduce consumption of carbonated water during the night     GOALS: Goals reviewed with patient? Yes  SHORT TERM GOALS: Target date: 10 sessions  The patient will increase hydration for an eventual goal of 6-8 glasses per day and limit caffeine intake (to maximum of 1-2, 8 oz cups/day), as measured by patient report.  Baseline: Goal status: INITIAL  2.  The patient will eliminate phonotraumatic behaviors such as chronic throat clearing, by substituting non-traumatic methods to clear mucus.  Baseline:  Goal status: INITIAL  3.  The patient will decrease laryngeal and articulatory muscle tension by independently completing relaxation/stretching exercises.  Baseline:  Goal status: INITIAL  4.  The patient will maximize voice quality and loudness using breath support/oral resonance for sustained vowel production, pitch glides, and hierarchal speech drill.  Baseline:  Goal status: INITIAL   LONG TERM GOALS: Target date: 12/03/2024  The patient will demonstrate independent understanding of  vocal hygiene concepts.  Baseline:  Goal status: INITIAL  2.  Pt will improve score on the VHI by 10% indicating improved perception of speech abilities. Baseline:  Goal status: INITIAL  ASSESSMENT:  CLINICAL IMPRESSION: Patient is a 68 y.o. female who was seen today for a voice evaluation. 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    Pt with multiple exacerbating risk factors such as excessive vocal use, decreased consumption of water, increased consumption of caffeine beverages, uncontrolled reflux (per pt report), habitual cough and throat clearing as well as potentially uncontrolled nasal congestion (see ENT information). .   OBJECTIVE IMPAIRMENTS include voice disorder. These impairments are limiting patient from effectively communicating at home and in community. Factors affecting potential to achieve goals and functional outcome are co-morbidities and severity of impairments. Patient will benefit from skilled SLP services to address above impairments and improve overall function.  REHAB POTENTIAL: Good  PLAN: SLP FREQUENCY: 1-2x/week  SLP DURATION: 12 weeks  PLANNED INTERVENTIONS: SLP instruction and feedback, Compensatory strategies, and Patient/family education   Clodagh Odenthal B. Rubbie, M.S., CCC-SLP, Tree Surgeon Certified Brain Injury Specialist [facility]  [facility] Rehabilitation Services Office [phone] Ascom [phone] Fax [phone]

## 2024-09-16 ENCOUNTER — Ambulatory Visit: Admitting: Speech Pathology

## 2024-09-16 DIAGNOSIS — R490 Dysphonia: Secondary | ICD-10-CM | POA: Diagnosis not present

## 2024-09-16 NOTE — Therapy (Unsigned)
 OUTPATIENT SPEECH LANGUAGE PATHOLOGY  VOICE TREATMENT NOTE   Patient Name: Autumn Ramos MRN: [id number] DOB:1956-07-17, 68 y.o., female Today's Date: 09/16/2024  PCP: Velva Calkins, MD REFERRING PROVIDER: Deward Dolly, MD   End of Session - 09/16/24 [phone]     Visit Number 2    Number of Visits 25    Date for Recertification  12/03/24    Authorization Type Medicare Part A/Medicare Part B; [employer]    Progress Note Due on Visit 10    SLP Start Time 0930    SLP Stop Time  1015    SLP Time Calculation (min) 45 min    Activity Tolerance Patient tolerated treatment well          Past Medical History:  Diagnosis Date   Anemia    Anxiety    Breast cancer (HCC)    Complication of anesthesia    difficult to wake up, very sensitive to medications   COPD (chronic obstructive pulmonary disease) (HCC)    Diabetes mellitus without complication (HCC)    Family history of adverse reaction to anesthesia    mother difficult to wake up   GERD (gastroesophageal reflux disease)    H/O breast implant    Hypertension    Hypothyroidism    Last menstrual period (LMP) > 10 days ago 10/30/2004   Pneumonia    Past Surgical History:  Procedure Laterality Date   BREAST ENHANCEMENT SURGERY     BREAST SURGERY     c sections x 2     DILATATION & CURETTAGE/HYSTEROSCOPY WITH MYOSURE N/A 09/11/2022   Procedure: FRACTIONAL DILATATION & CURETTAGE/HYSTEROSCOPY WITH MYOSURE, RESECTION OF POLYP;  Surgeon: Schermerhorn, Debby PARAS, MD;  Location: [facility] ORS;  Service: Gynecology;  Laterality: N/A;   DILATION AND CURETTAGE OF UTERUS     MASTECTOMY     TONSILLECTOMY     Patient Active Problem List   Diagnosis Date Noted   COPD exacerbation (HCC) 01/30/2023   Chronic narcotic use 01/30/2023   Uncontrolled type 2 diabetes mellitus with hyperglycemia, with long-term current use of insulin  (HCC) 01/30/2023   High risk medication use 11/10/2020   Allergic rhinitis 03/09/2020   Chronic  obstructive lung disease (HCC) 03/09/2020   Constipation 03/09/2020   Hypercholesterolemia 03/09/2020   Gastroesophageal reflux disease 03/09/2020   Hypothyroidism 03/09/2020   Migraine 03/09/2020   Obesity 03/09/2020   Tobacco user 03/09/2020   Acquired absence of both breasts 01/16/2018   Acute bronchitis due to infection 03/08/2016   Neoplasm of left breast, 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) 03/06/2016   Primary cancer of lower-outer quadrant of left breast (HCC) 05/03/2015   Infiltrating ductal carcinoma of right breast, stage 1 (HCC) 10/26/2014   Atypical chest pain 09/23/2014   Hyperlipidemia 09/23/2014   Hypertension 09/23/2014   Major depressive disorder 06/12/2011   Poisoning by drug or medicinal substance 06/11/2011    ONSET DATE:  ~ 5 years ago, date of referral 09/03/2024  REFERRING DIAG: R49.0 (ICD-10-CM) - Hoarseness   THERAPY DIAG:  Dysphonia  Rationale for Evaluation and Treatment Rehabilitation  SUBJECTIVE:   PERTINENT HISTORY: Pt is a 68 year old female with medical history of breast cancer, diabetes, COPD, reflux.   DIAGNOSTIC FINDINGS:  Laryngoscopy 09/03/2024 The nasal cavity is congested with clear secretions. Congestion consistent with allergies. She declined nasal steroid spray.  Vocal cords are mobile with senile atrophy of the cords, but no mucosal lesions. There is some mild mucosal edema.  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 Given the senile atrophy of the cords, would recommend voice therapy in addition to continued reflux management and hydration to offset the drying effects of her medications.  SABRA    PAIN:  Are you having pain? No   FALLS: Has patient fallen in last 6 months? No,   LIVING ENVIRONMENT: Lives with: lives alone Lives in: House/apartment  PLOF: Independent  PATIENT GOALS    to improve voice, ability to talk  SUBJECTIVE STATEMENT: Pt pleasant, good historian, eager,  reports improved vocal quality since last session Pt accompanied by: self  OBJECTIVE:   TODAY'S TREATMENT:  Skilled treatment session focused on pt's dysphonia goals. SLP facilitated session by providing the following interventions:   Pt arrived to session with improved vocal quality (no moments of aphonia observed throughout the session) she also reports significant improvement since last session. She also reports progress with increasing hydration, loves using steam from a warm wash rag, decreasing caffeine.   Skilled written and verbal education provided on hydration, mucus management, throat clearing, do's and don'ts of voice.   Skilled instruction provide don abdominal breathing. Instruction provided on practicing while pulsing /s/. Pt would benefit from further practice.    PATIENT EDUCATION: Education details: see above Person educated: Patient Education method: Explanation Education comprehension: needs further education   HOME EXERCISE PROGRAM: Increase consumption of water in the morning, reduce consumption of carbonated water during the night     GOALS: Goals reviewed with patient? Yes  SHORT TERM GOALS: Target date: 10 sessions  The patient will increase hydration for an eventual goal of 6-8 glasses per day and limit caffeine intake (to maximum of 1-2, 8 oz cups/day), as measured by patient report.  Baseline: Goal status: INITIAL  2.  The patient will eliminate phonotraumatic behaviors such as chronic throat clearing, by substituting non-traumatic methods to clear mucus.  Baseline:  Goal status: INITIAL  3.  The patient will decrease laryngeal and articulatory muscle tension by independently completing relaxation/stretching exercises.  Baseline:  Goal status: INITIAL  4.  The patient will maximize voice quality and loudness using breath support/oral resonance for sustained vowel production, pitch glides, and hierarchal speech drill.  Baseline:  Goal status:  INITIAL   LONG TERM GOALS: Target date: 12/03/2024  The patient will demonstrate independent understanding of vocal hygiene concepts.  Baseline:  Goal status: INITIAL  2.  Pt will improve score on the VHI by 10% indicating improved perception of speech abilities. Baseline:  Goal status: INITIAL  ASSESSMENT:  CLINICAL IMPRESSION: Patient is a 68 y.o. female who was seen today for a voice treatment. 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   Pt with multiple exacerbating risk factors such as excessive vocal use, decreased consumption of water, increased consumption of caffeine beverages, uncontrolled reflux (per pt report), habitual cough and throat clearing as well as potentially uncontrolled nasal congestion (see ENT information). .   OBJECTIVE IMPAIRMENTS include voice disorder. These impairments are limiting patient from effectively communicating at home and in community. Factors affecting potential to achieve goals and functional outcome are co-morbidities and severity of impairments. Patient will benefit from skilled SLP services to address above impairments and improve overall function.  REHAB POTENTIAL: Good  PLAN: SLP FREQUENCY: 1-2x/week  SLP DURATION: 12 weeks  PLANNED INTERVENTIONS: SLP instruction and feedback, Compensatory strategies, and Patient/family education   Happi B. Rubbie, M.S., CCC-SLP, CBIS Speech-Language Pathologist Certified Brain Injury Specialist [facility]  [facility] Rehabilitation Services Office [phone] Ascom [phone] Fax [phone]

## 2024-09-18 ENCOUNTER — Ambulatory Visit: Admitting: Speech Pathology

## 2024-09-18 DIAGNOSIS — R490 Dysphonia: Secondary | ICD-10-CM | POA: Diagnosis not present

## 2024-09-18 NOTE — Therapy (Signed)
 OUTPATIENT SPEECH LANGUAGE PATHOLOGY  VOICE TREATMENT NOTE   Patient Name: Autumn Ramos MRN: [id number] DOB:28-Oct-1956, 68 y.o., female Today's Date: 09/18/2024  PCP: Velva Calkins, MD REFERRING PROVIDER: Deward Dolly, MD   End of Session - 09/18/24 0925     Visit Number 3    Number of Visits 25    Date for Recertification  12/03/24    Authorization Type Medicare Part A/Medicare Part B; [employer]    Progress Note Due on Visit 10    SLP Start Time 0930    SLP Stop Time  1015    SLP Time Calculation (min) 45 min    Activity Tolerance Patient tolerated treatment well          Past Medical History:  Diagnosis Date   Anemia    Anxiety    Breast cancer (HCC)    Complication of anesthesia    difficult to wake up, very sensitive to medications   COPD (chronic obstructive pulmonary disease) (HCC)    Diabetes mellitus without complication (HCC)    Family history of adverse reaction to anesthesia    mother difficult to wake up   GERD (gastroesophageal reflux disease)    H/O breast implant    Hypertension    Hypothyroidism    Last menstrual period (LMP) > 10 days ago 10/30/2004   Pneumonia    Past Surgical History:  Procedure Laterality Date   BREAST ENHANCEMENT SURGERY     BREAST SURGERY     c sections x 2     DILATATION & CURETTAGE/HYSTEROSCOPY WITH MYOSURE N/A 09/11/2022   Procedure: FRACTIONAL DILATATION & CURETTAGE/HYSTEROSCOPY WITH MYOSURE, RESECTION OF POLYP;  Surgeon: Schermerhorn, Debby PARAS, MD;  Location: [facility] ORS;  Service: Gynecology;  Laterality: N/A;   DILATION AND CURETTAGE OF UTERUS     MASTECTOMY     TONSILLECTOMY     Patient Active Problem List   Diagnosis Date Noted   COPD exacerbation (HCC) 01/30/2023   Chronic narcotic use 01/30/2023   Uncontrolled type 2 diabetes mellitus with hyperglycemia, with long-term current use of insulin  (HCC) 01/30/2023   High risk medication use 11/10/2020   Allergic rhinitis 03/09/2020   Chronic  obstructive lung disease (HCC) 03/09/2020   Constipation 03/09/2020   Hypercholesterolemia 03/09/2020   Gastroesophageal reflux disease 03/09/2020   Hypothyroidism 03/09/2020   Migraine 03/09/2020   Obesity 03/09/2020   Tobacco user 03/09/2020   Acquired absence of both breasts 01/16/2018   Acute bronchitis due to infection 03/08/2016   Neoplasm of left breast, 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) 03/06/2016   Primary cancer of lower-outer quadrant of left breast (HCC) 05/03/2015   Infiltrating ductal carcinoma of right breast, stage 1 (HCC) 10/26/2014   Atypical chest pain 09/23/2014   Hyperlipidemia 09/23/2014   Hypertension 09/23/2014   Major depressive disorder 06/12/2011   Poisoning by drug or medicinal substance 06/11/2011    ONSET DATE:  ~ 5 years ago, date of referral 09/03/2024  REFERRING DIAG: R49.0 (ICD-10-CM) - Hoarseness   THERAPY DIAG:  Dysphonia  Rationale for Evaluation and Treatment Rehabilitation  SUBJECTIVE:   PERTINENT HISTORY: Pt is a 68 year old female with medical history of breast cancer, diabetes, COPD, reflux.   DIAGNOSTIC FINDINGS:  Laryngoscopy 09/03/2024 The nasal cavity is congested with clear secretions. Congestion consistent with allergies. She declined nasal steroid spray.  Vocal cords are mobile with senile atrophy of the cords, but no mucosal lesions. There is some mild mucosal edema.  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 Given the senile atrophy of the cords, would recommend voice therapy in addition to continued reflux management and hydration to offset the drying effects of her medications.  SABRA    PAIN:  Are you having pain? No   FALLS: Has patient fallen in last 6 months? No,   LIVING ENVIRONMENT: Lives with: lives alone Lives in: House/apartment  PLOF: Independent  PATIENT GOALS    to improve voice, ability to talk  SUBJECTIVE STATEMENT: Pt pleasant, good historian, eager,  reports improved vocal quality since last session Pt accompanied by: self  OBJECTIVE:   TODAY'S TREATMENT:  Skilled treatment session focused on pt's dysphonia goals. SLP facilitated session by providing the following interventions:   I did get a netty pot and I got the nasal spray. I am getting some stuff out of my nose and I feel like I need to cough a little bit more. Recommend after consuming water, pt cough once and if cough is productive than it is an appropriate sensation. If cough is not productive then continue to engage in cough/throat clearing suppression techniques.   Pt arrives with continued improvement in vocal quality that is c/b strong phonation that was free of any strain, breathy quality.   Pt mentions extended conversations with her daughter over the last several days. Education provided on use of vocal rest following extended talking. Continue to recommend increasing hydration with water.    PATIENT EDUCATION: Education details: see above Person educated: Patient Education method: Explanation Education comprehension: needs further education   HOME EXERCISE PROGRAM: Increase consumption of water in the morning, reduce consumption of carbonated water during the night     GOALS: Goals reviewed with patient? Yes  SHORT TERM GOALS: Target date: 10 sessions  The patient will increase hydration for an eventual goal of 6-8 glasses per day and limit caffeine intake (to maximum of 1-2, 8 oz cups/day), as measured by patient report.  Baseline: Goal status: INITIAL  2.  The patient will eliminate phonotraumatic behaviors such as chronic throat clearing, by substituting non-traumatic methods to clear mucus.  Baseline:  Goal status: INITIAL  3.  The patient will decrease laryngeal and articulatory muscle tension by independently completing relaxation/stretching exercises.  Baseline:  Goal status: INITIAL  4.  The patient will maximize voice quality and loudness  using breath support/oral resonance for sustained vowel production, pitch glides, and hierarchal speech drill.  Baseline:  Goal status: INITIAL   LONG TERM GOALS: Target date: 12/03/2024  The patient will demonstrate independent understanding of vocal hygiene concepts.  Baseline:  Goal status: INITIAL  2.  Pt will improve score on the VHI by 10% indicating improved perception of speech abilities. Baseline:  Goal status: INITIAL  ASSESSMENT:  CLINICAL IMPRESSION: Patient is a 68 y.o. female who was seen today for a voice treatment. 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   Pt with multiple exacerbating risk factors such as excessive vocal use, decreased consumption of water, increased consumption of caffeine beverages, uncontrolled reflux (per pt report), habitual cough and throat clearing as well as potentially uncontrolled nasal congestion (see ENT information). .   OBJECTIVE IMPAIRMENTS include voice disorder. These impairments are limiting patient from effectively communicating at home and in community. Factors affecting potential to achieve goals and functional outcome are co-morbidities and severity of impairments. Patient will benefit from skilled SLP services to address above impairments and improve overall function.  REHAB POTENTIAL: Good  PLAN: SLP FREQUENCY: 1-2x/week  SLP DURATION: 12 weeks  PLANNED INTERVENTIONS: SLP instruction and feedback, Compensatory strategies, and Patient/family education   Ember Gottwald B. Rubbie, M.S., CCC-SLP, Tree Surgeon Certified Brain Injury Specialist [facility]  [facility] Rehabilitation Services Office [phone] Ascom [phone] Fax [phone]

## 2024-09-23 ENCOUNTER — Ambulatory Visit: Admitting: Speech Pathology

## 2024-09-23 ENCOUNTER — Telehealth: Payer: Self-pay | Admitting: Speech Pathology

## 2024-09-23 NOTE — Telephone Encounter (Signed)
 Pt was a no call/no show to her ST session today. This clinical research associate called and spoke with pt. She reports looking at the wrong date. Next ST session confirmed.   [name] B. Rubbie, M.S., CCC-SLP, Tree Surgeon Certified Brain Injury Specialist [facility]  [facility] Rehabilitation Services Office [phone] Ascom [phone] Fax [phone]

## 2024-09-29 ENCOUNTER — Ambulatory Visit: Admitting: Speech Pathology

## 2024-10-01 ENCOUNTER — Ambulatory Visit: Admitting: Speech Pathology

## 2024-10-02 ENCOUNTER — Ambulatory Visit: Attending: Otolaryngology | Admitting: Speech Pathology

## 2024-10-02 DIAGNOSIS — R490 Dysphonia: Secondary | ICD-10-CM | POA: Diagnosis present

## 2024-10-03 NOTE — Therapy (Signed)
 OUTPATIENT SPEECH LANGUAGE PATHOLOGY  VOICE TREATMENT NOTE   Patient Name: Autumn Ramos MRN: [id number] DOB:Sep 06, 1956, 68 y.o., female Today's Date: 10/03/2024  PCP: Velva Calkins, MD REFERRING PROVIDER: Deward Dolly, MD   End of Session - 10/03/24 1601     Visit Number 4    Number of Visits 25    Date for Recertification  12/03/24    Authorization Type Medicare Part A/Medicare Part B; [employer]    Progress Note Due on Visit 10    SLP Start Time 1405    SLP Stop Time  1435    SLP Time Calculation (min) 30 min    Activity Tolerance Patient tolerated treatment well          Past Medical History:  Diagnosis Date   Anemia    Anxiety    Breast cancer (HCC)    Complication of anesthesia    difficult to wake up, very sensitive to medications   COPD (chronic obstructive pulmonary disease) (HCC)    Diabetes mellitus without complication (HCC)    Family history of adverse reaction to anesthesia    mother difficult to wake up   GERD (gastroesophageal reflux disease)    H/O breast implant    Hypertension    Hypothyroidism    Last menstrual period (LMP) > 10 days ago 10/30/2004   Pneumonia    Past Surgical History:  Procedure Laterality Date   BREAST ENHANCEMENT SURGERY     BREAST SURGERY     c sections x 2     DILATATION & CURETTAGE/HYSTEROSCOPY WITH MYOSURE N/A 09/11/2022   Procedure: FRACTIONAL DILATATION & CURETTAGE/HYSTEROSCOPY WITH MYOSURE, RESECTION OF POLYP;  Surgeon: Schermerhorn, Debby PARAS, MD;  Location: [facility] ORS;  Service: Gynecology;  Laterality: N/A;   DILATION AND CURETTAGE OF UTERUS     MASTECTOMY     TONSILLECTOMY     Patient Active Problem List   Diagnosis Date Noted   COPD exacerbation (HCC) 01/30/2023   Chronic narcotic use 01/30/2023   Uncontrolled type 2 diabetes mellitus with hyperglycemia, with long-term current use of insulin  (HCC) 01/30/2023   High risk medication use 11/10/2020   Allergic rhinitis 03/09/2020   Chronic  obstructive lung disease (HCC) 03/09/2020   Constipation 03/09/2020   Hypercholesterolemia 03/09/2020   Gastroesophageal reflux disease 03/09/2020   Hypothyroidism 03/09/2020   Migraine 03/09/2020   Obesity 03/09/2020   Tobacco user 03/09/2020   Acquired absence of both breasts 01/16/2018   Acute bronchitis due to infection 03/08/2016   Neoplasm of left breast, 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) 03/06/2016   Primary cancer of lower-outer quadrant of left breast (HCC) 05/03/2015   Infiltrating ductal carcinoma of right breast, stage 1 (HCC) 10/26/2014   Atypical chest pain 09/23/2014   Hyperlipidemia 09/23/2014   Hypertension 09/23/2014   Major depressive disorder 06/12/2011   Poisoning by drug or medicinal substance 06/11/2011    ONSET DATE:  ~ 5 years ago, date of referral 09/03/2024  REFERRING DIAG: R49.0 (ICD-10-CM) - Hoarseness   THERAPY DIAG:  Dysphonia  Rationale for Evaluation and Treatment Rehabilitation  SUBJECTIVE:   PERTINENT HISTORY: Pt is a 68 year old female with medical history of breast cancer, diabetes, COPD, reflux.   DIAGNOSTIC FINDINGS:  Laryngoscopy 09/03/2024 The nasal cavity is congested with clear secretions. Congestion consistent with allergies. She declined nasal steroid spray.  Vocal cords are mobile with senile atrophy of the cords, but no mucosal lesions. There is some mild mucosal edema.  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 Given the senile atrophy of the cords, would recommend voice therapy in addition to continued reflux management and hydration to offset the drying effects of her medications.  SABRA    PAIN:  Are you having pain? No   FALLS: Has patient fallen in last 6 months? No,   LIVING ENVIRONMENT: Lives with: lives alone Lives in: House/apartment  PLOF: Independent  PATIENT GOALS    to improve voice, ability to talk  SUBJECTIVE STATEMENT: Pt pleasant, good historian, eager,  reports improved vocal quality since last session Pt accompanied by: self  OBJECTIVE:   TODAY'S TREATMENT:  Skilled treatment session focused on pt's dysphonia goals. SLP facilitated session by providing the following interventions:   Pt with significantly decreased vocal quality and increased dysphonia. Pt provides that she feels sinus pain and pressure despite being compliant with use of netty pot and nasal steroid spray. Skilled education provided on good vocal hygiene in the setting of potential URI - pt to make appt with ENT for further assessment for sinus infection. Pt voiced understanding.    PATIENT EDUCATION: Education details: see above Person educated: Patient Education method: Explanation Education comprehension: needs further education   HOME EXERCISE PROGRAM: Increase consumption of water in the morning, reduce consumption of carbonated water during the night     GOALS: Goals reviewed with patient? Yes  SHORT TERM GOALS: Target date: 10 sessions  The patient will increase hydration for an eventual goal of 6-8 glasses per day and limit caffeine intake (to maximum of 1-2, 8 oz cups/day), as measured by patient report.  Baseline: Goal status: INITIAL  2.  The patient will eliminate phonotraumatic behaviors such as chronic throat clearing, by substituting non-traumatic methods to clear mucus.  Baseline:  Goal status: INITIAL  3.  The patient will decrease laryngeal and articulatory muscle tension by independently completing relaxation/stretching exercises.  Baseline:  Goal status: INITIAL  4.  The patient will maximize voice quality and loudness using breath support/oral resonance for sustained vowel production, pitch glides, and hierarchal speech drill.  Baseline:  Goal status: INITIAL   LONG TERM GOALS: Target date: 12/03/2024  The patient will demonstrate independent understanding of vocal hygiene concepts.  Baseline:  Goal status: INITIAL  2.  Pt  will improve score on the VHI by 10% indicating improved perception of speech abilities. Baseline:  Goal status: INITIAL  ASSESSMENT:  CLINICAL IMPRESSION: Patient is a 68 y.o. female who was seen today for a voice treatment. 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   Pt with multiple exacerbating risk factors such as excessive vocal use, decreased consumption of water, increased consumption of caffeine beverages, uncontrolled reflux (per pt report), habitual cough and throat clearing as well as potentially uncontrolled nasal congestion (see ENT information). .   OBJECTIVE IMPAIRMENTS include voice disorder. These impairments are limiting patient from effectively communicating at home and in community. Factors affecting potential to achieve goals and functional outcome are co-morbidities and severity of impairments. Patient will benefit from skilled SLP services to address above impairments and improve overall function.  REHAB POTENTIAL: Good  PLAN: SLP FREQUENCY: 1-2x/week  SLP DURATION: 12 weeks  PLANNED INTERVENTIONS: SLP instruction and feedback, Compensatory strategies, and Patient/family education   Jeanann Balinski B. Rubbie, M.S., CCC-SLP, Tree Surgeon Certified Brain Injury Specialist [facility]  [facility] Rehabilitation Services Office [phone] Ascom [phone] Fax [phone]

## 2024-10-06 ENCOUNTER — Ambulatory Visit: Admitting: Speech Pathology

## 2024-10-06 ENCOUNTER — Telehealth: Payer: Self-pay | Admitting: Speech Pathology

## 2024-10-06 NOTE — Telephone Encounter (Signed)
 Pt was a no call/no show for today's ST session. Pt states that she didn't put it on her calendar.   Review next session with pt.   Pansie Guggisberg B. Rubbie, M.S., CCC-SLP, Tree Surgeon Certified Brain Injury Specialist [facility]  [facility] Rehabilitation Services Office [phone] Ascom [phone] Fax [phone]

## 2024-10-07 ENCOUNTER — Telehealth: Payer: Self-pay | Admitting: Speech Pathology

## 2024-10-07 NOTE — Telephone Encounter (Signed)
 This clinical research associate received phone call from pt around 1500. Pt states that she was able to be seen by ENT who confirmed that she had a sinus infection, placed her on an antibiotic and steroid. Given current illness and hoarseness, will resume therapy following treatment for sinus infection.   Deneisha Dade B. Rubbie, M.S., CCC-SLP, Tree Surgeon Certified Brain Injury Specialist [facility]  [facility] Rehabilitation Services Office [phone] Ascom [phone] Fax [phone]

## 2024-10-08 ENCOUNTER — Ambulatory Visit: Admitting: Speech Pathology

## 2024-10-13 ENCOUNTER — Ambulatory Visit: Admitting: Speech Pathology

## 2024-10-15 ENCOUNTER — Ambulatory Visit: Admitting: Speech Pathology

## 2024-10-15 DIAGNOSIS — R490 Dysphonia: Secondary | ICD-10-CM

## 2024-10-15 NOTE — Therapy (Signed)
 OUTPATIENT SPEECH LANGUAGE PATHOLOGY  VOICE TREATMENT NOTE   Patient Name: Autumn Ramos MRN: [id number] DOB:[birth year], 68 y.o., female Today's Date: 10/15/2024  PCP: Velva Calkins, MD REFERRING PROVIDER: Deward Dolly, MD   End of Session - 10/15/24 1416     Visit Number 5    Number of Visits 25    Date for Recertification  12/03/24    Authorization Type Medicare Part A/Medicare Part B; [employer]    Progress Note Due on Visit 10    SLP Start Time 0930    SLP Stop Time  1000    SLP Time Calculation (min) 30 min    Activity Tolerance Patient tolerated treatment well          Past Medical History:  Diagnosis Date   Anemia    Anxiety    Breast cancer (HCC)    Complication of anesthesia    difficult to wake up, very sensitive to medications   COPD (chronic obstructive pulmonary disease) (HCC)    Diabetes mellitus without complication (HCC)    Family history of adverse reaction to anesthesia    mother difficult to wake up   GERD (gastroesophageal reflux disease)    H/O breast implant    Hypertension    Hypothyroidism    Last menstrual period (LMP) > 10 days ago 10/30/2004   Pneumonia    Past Surgical History:  Procedure Laterality Date   BREAST ENHANCEMENT SURGERY     BREAST SURGERY     c sections x 2     DILATATION & CURETTAGE/HYSTEROSCOPY WITH MYOSURE N/A 09/11/2022   Procedure: FRACTIONAL DILATATION & CURETTAGE/HYSTEROSCOPY WITH MYOSURE, RESECTION OF POLYP;  Surgeon: Schermerhorn, Debby PARAS, MD;  Location: [facility] ORS;  Service: Gynecology;  Laterality: N/A;   DILATION AND CURETTAGE OF UTERUS     MASTECTOMY     TONSILLECTOMY     Patient Active Problem List   Diagnosis Date Noted   COPD exacerbation (HCC) 01/30/2023   Chronic narcotic use 01/30/2023   Uncontrolled type 2 diabetes mellitus with hyperglycemia, with long-term current use of insulin  (HCC) 01/30/2023   High risk medication use 11/10/2020   Allergic rhinitis 03/09/2020   Chronic  obstructive lung disease (HCC) 03/09/2020   Constipation 03/09/2020   Hypercholesterolemia 03/09/2020   Gastroesophageal reflux disease 03/09/2020   Hypothyroidism 03/09/2020   Migraine 03/09/2020   Obesity 03/09/2020   Tobacco user 03/09/2020   Acquired absence of both breasts 01/16/2018   Acute bronchitis due to infection 03/08/2016   Neoplasm of left breast, 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) 03/06/2016   Primary cancer of lower-outer quadrant of left breast (HCC) 05/03/2015   Infiltrating ductal carcinoma of right breast, stage 1 (HCC) 10/26/2014   Atypical chest pain 09/23/2014   Hyperlipidemia 09/23/2014   Hypertension 09/23/2014   Major depressive disorder 06/12/2011   Poisoning by drug or medicinal substance 06/11/2011    ONSET DATE:  ~ 5 years ago, date of referral 09/03/2024  REFERRING DIAG: R49.0 (ICD-10-CM) - Hoarseness   THERAPY DIAG:  Dysphonia  Rationale for Evaluation and Treatment Rehabilitation  SUBJECTIVE:   PERTINENT HISTORY: Pt is a 68 year old female with medical history of breast cancer, diabetes, COPD, reflux.   DIAGNOSTIC FINDINGS:  Laryngoscopy 09/03/2024 The nasal cavity is congested with clear secretions. Congestion consistent with allergies. She declined nasal steroid spray.  Vocal cords are mobile with senile atrophy of the cords, but no mucosal lesions. There is some mild mucosal edema.  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 Given the senile atrophy of the cords, would recommend voice therapy in addition to continued reflux management and hydration to offset the drying effects of her medications.  SABRA    PAIN:  Are you having pain? No   FALLS: Has patient fallen in last 6 months? No,   LIVING ENVIRONMENT: Lives with: lives alone Lives in: House/apartment  PLOF: Independent  PATIENT GOALS    to improve voice, ability to talk  SUBJECTIVE STATEMENT: Pt pleasant, good historian, eager,  reports improved vocal quality since last session Pt accompanied by: self  OBJECTIVE:   TODAY'S TREATMENT:  Skilled treatment session focused on pt's dysphonia goals. SLP facilitated session by providing the following interventions:   Pt reports confirmed by ENT of sinus infection, currently on antibiotics and steroids. Her vocal quality if much improved over previous session. She further reports that this is her 3rd sinus infection since September. She will seek potential MRI as offered by her ENT to further evaluation potential etiology for infections. Education provided on continuing vocal rest during this period of time as well as increase hydration.    PATIENT EDUCATION: Education details: see above Person educated: Patient Education method: Explanation Education comprehension: needs further education   HOME EXERCISE PROGRAM: Increase consumption of water in the morning, reduce consumption of carbonated water during the night     GOALS: Goals reviewed with patient? Yes  SHORT TERM GOALS: Target date: 10 sessions  The patient will increase hydration for an eventual goal of 6-8 glasses per day and limit caffeine intake (to maximum of 1-2, 8 oz cups/day), as measured by patient report.  Baseline: Goal status: INITIAL  2.  The patient will eliminate phonotraumatic behaviors such as chronic throat clearing, by substituting non-traumatic methods to clear mucus.  Baseline:  Goal status: INITIAL  3.  The patient will decrease laryngeal and articulatory muscle tension by independently completing relaxation/stretching exercises.  Baseline:  Goal status: INITIAL  4.  The patient will maximize voice quality and loudness using breath support/oral resonance for sustained vowel production, pitch glides, and hierarchal speech drill.  Baseline:  Goal status: INITIAL   LONG TERM GOALS: Target date: 12/03/2024  The patient will demonstrate independent understanding of vocal hygiene  concepts.  Baseline:  Goal status: INITIAL  2.  Pt will improve score on the VHI by 10% indicating improved perception of speech abilities. Baseline:  Goal status: INITIAL  ASSESSMENT:  CLINICAL IMPRESSION: Patient is a 68 y.o. female who was seen today for a voice treatment. 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   Pt with multiple exacerbating risk factors such as excessive vocal use, decreased consumption of water, increased consumption of caffeine beverages, uncontrolled reflux (per pt report), habitual cough and throat clearing as well as potentially uncontrolled nasal congestion (see ENT information). .   OBJECTIVE IMPAIRMENTS include voice disorder. These impairments are limiting patient from effectively communicating at home and in community. Factors affecting potential to achieve goals and functional outcome are co-morbidities and severity of impairments. Patient will benefit from skilled SLP services to address above impairments and improve overall function.  REHAB POTENTIAL: Good  PLAN: SLP FREQUENCY: 1-2x/week  SLP DURATION: 12 weeks  PLANNED INTERVENTIONS: SLP instruction and feedback, Compensatory strategies, and Patient/family education   Rodriquez Thorner B. Rubbie, M.S., CCC-SLP, Tree Surgeon Certified Brain Injury Specialist [facility]  [facility] Rehabilitation Services Office [phone] Ascom [phone] Fax [phone]

## 2024-10-21 ENCOUNTER — Ambulatory Visit: Admitting: Speech Pathology

## 2024-10-21 DIAGNOSIS — R490 Dysphonia: Secondary | ICD-10-CM | POA: Diagnosis not present

## 2024-10-21 NOTE — Therapy (Signed)
" OUTPATIENT SPEECH LANGUAGE PATHOLOGY  VOICE TREATMENT NOTE   Patient Name: Autumn Ramos MRN: [id number] DOB:02/07/1956, 68 y.o., female Today's Date: 10/21/2024  PCP: Velva Calkins, MD REFERRING PROVIDER: Deward Dolly, MD   End of Session - 10/21/24 1130     Visit Number 6    Number of Visits 25    Date for Recertification  12/03/24    Authorization Type Medicare Part A/Medicare Part B; [employer]    Progress Note Due on Visit 10    SLP Start Time 1100    SLP Stop Time  1140    SLP Time Calculation (min) 40 min    Activity Tolerance Patient tolerated treatment well          Past Medical History:  Diagnosis Date   Anemia    Anxiety    Breast cancer (HCC)    Complication of anesthesia    difficult to wake up, very sensitive to medications   COPD (chronic obstructive pulmonary disease) (HCC)    Diabetes mellitus without complication (HCC)    Family history of adverse reaction to anesthesia    mother difficult to wake up   GERD (gastroesophageal reflux disease)    H/O breast implant    Hypertension    Hypothyroidism    Last menstrual period (LMP) > 10 days ago 10/30/2004   Pneumonia    Past Surgical History:  Procedure Laterality Date   BREAST ENHANCEMENT SURGERY     BREAST SURGERY     c sections x 2     DILATATION & CURETTAGE/HYSTEROSCOPY WITH MYOSURE N/A 09/11/2022   Procedure: FRACTIONAL DILATATION & CURETTAGE/HYSTEROSCOPY WITH MYOSURE, RESECTION OF POLYP;  Surgeon: Schermerhorn, Debby PARAS, MD;  Location: [facility] ORS;  Service: Gynecology;  Laterality: N/A;   DILATION AND CURETTAGE OF UTERUS     MASTECTOMY     TONSILLECTOMY     Patient Active Problem List   Diagnosis Date Noted   COPD exacerbation (HCC) 01/30/2023   Chronic narcotic use 01/30/2023   Uncontrolled type 2 diabetes mellitus with hyperglycemia, with long-term current use of insulin  (HCC) 01/30/2023   High risk medication use 11/10/2020   Allergic rhinitis 03/09/2020   Chronic  obstructive lung disease (HCC) 03/09/2020   Constipation 03/09/2020   Hypercholesterolemia 03/09/2020   Gastroesophageal reflux disease 03/09/2020   Hypothyroidism 03/09/2020   Migraine 03/09/2020   Obesity 03/09/2020   Tobacco user 03/09/2020   Acquired absence of both breasts 01/16/2018   Acute bronchitis due to infection 03/08/2016   Neoplasm of left breast, 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) 03/06/2016   Primary cancer of lower-outer quadrant of left breast (HCC) 05/03/2015   Infiltrating ductal carcinoma of right breast, stage 1 (HCC) 10/26/2014   Atypical chest pain 09/23/2014   Hyperlipidemia 09/23/2014   Hypertension 09/23/2014   Major depressive disorder 06/12/2011   Poisoning by drug or medicinal substance 06/11/2011    ONSET DATE:  ~ 5 years ago, date of referral 09/03/2024  REFERRING DIAG: R49.0 (ICD-10-CM) - Hoarseness   THERAPY DIAG:  Dysphonia  Rationale for Evaluation and Treatment Rehabilitation  SUBJECTIVE:   PERTINENT HISTORY: Pt is a 68 year old female with medical history of breast cancer, diabetes, COPD, reflux.   DIAGNOSTIC FINDINGS:  Laryngoscopy 09/03/2024 The nasal cavity is congested with clear secretions. Congestion consistent with allergies. She declined nasal steroid spray.  Vocal cords are mobile with senile atrophy of the cords, but no mucosal lesions. There is some mild mucosal  edema. 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 Given the senile atrophy of the cords, would recommend voice therapy in addition to continued reflux management and hydration to offset the drying effects of her medications.  SABRA    PAIN:  Are you having pain? No   FALLS: Has patient fallen in last 6 months? No,   LIVING ENVIRONMENT: Lives with: lives alone Lives in: House/apartment  PLOF: Independent  PATIENT GOALS    to improve voice, ability to talk  SUBJECTIVE STATEMENT: Pt pleasant, good historian, eager,  reports inability to project during CPR class Pt accompanied by: self  OBJECTIVE:   TODAY'S TREATMENT:  Skilled treatment session focused on pt's dysphonia goals. SLP facilitated session by providing the following interventions:   Pt with increased dysphonia today - reports frustration with inability to talk and project her voice during CPR training class  She also reports having an appointment for sinus imaging next Wednesday.   SLP provided verbal and written instruction targeting voice strengthening activities. Pt able to complete with improve phonation/vocal quality with rare Min A cues. HEP instructions provided to target these activities.    PATIENT EDUCATION: Education details: see above Person educated: Patient Education method: Explanation Education comprehension: needs further education   HOME EXERCISE PROGRAM: Increase consumption of water in the morning, reduce consumption of carbonated water during the night Voice strengthening activities above     GOALS: Goals reviewed with patient? Yes  SHORT TERM GOALS: Target date: 10 sessions  The patient will increase hydration for an eventual goal of 6-8 glasses per day and limit caffeine intake (to maximum of 1-2, 8 oz cups/day), as measured by patient report.  Baseline: Goal status: INITIAL  2.  The patient will eliminate phonotraumatic behaviors such as chronic throat clearing, by substituting non-traumatic methods to clear mucus.  Baseline:  Goal status: INITIAL  3.  The patient will decrease laryngeal and articulatory muscle tension by independently completing relaxation/stretching exercises.  Baseline:  Goal status: INITIAL  4.  The patient will maximize voice quality and loudness using breath support/oral resonance for sustained vowel production, pitch glides, and hierarchal speech drill.  Baseline:  Goal status: INITIAL   LONG TERM GOALS: Target date: 12/03/2024  The patient will demonstrate independent  understanding of vocal hygiene concepts.  Baseline:  Goal status: INITIAL  2.  Pt will improve score on the VHI by 10% indicating improved perception of speech abilities. Baseline:  Goal status: INITIAL  ASSESSMENT:  CLINICAL IMPRESSION: Patient is a 68 y.o. female who was seen today for a voice treatment. 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   Pt with multiple exacerbating risk factors such as excessive vocal use, decreased consumption of water, increased consumption of caffeine beverages, uncontrolled reflux (per pt report), habitual cough and throat clearing as well as potentially uncontrolled nasal congestion (see ENT information). .   OBJECTIVE IMPAIRMENTS include voice disorder. These impairments are limiting patient from effectively communicating at home and in community. Factors affecting potential to achieve goals and functional outcome are co-morbidities and severity of impairments. Patient will benefit from skilled SLP services to address above impairments and improve overall function.  REHAB POTENTIAL: Good  PLAN: SLP FREQUENCY: 1-2x/week  SLP DURATION: 12 weeks  PLANNED INTERVENTIONS: SLP instruction and feedback, Compensatory strategies, and Patient/family education   Khyla Mccumbers B. Rubbie, M.S., CCC-SLP, CBIS Speech-Language Pathologist Certified Brain Injury Specialist [facility]  [facility] Rehabilitation Services Office [phone] Ascom [phone] Fax  [phone] ° °"

## 2024-10-28 ENCOUNTER — Ambulatory Visit: Admitting: Speech Pathology

## 2024-11-04 ENCOUNTER — Ambulatory Visit: Attending: Otolaryngology | Admitting: Speech Pathology

## 2024-11-04 DIAGNOSIS — R490 Dysphonia: Secondary | ICD-10-CM | POA: Insufficient documentation

## 2024-11-04 NOTE — Therapy (Signed)
 " OUTPATIENT SPEECH LANGUAGE PATHOLOGY  VOICE TREATMENT NOTE   Patient Name: Autumn Ramos MRN: [id number] DOB:1956-03-13, 69 y.o., female Today's Date: 11/04/2024  PCP: Velva Calkins, MD REFERRING PROVIDER: Deward Dolly, MD   End of Session - 11/04/24 1415     Visit Number 7    Number of Visits 25    Date for Recertification  12/03/24    Authorization Type Medicare Part A/Medicare Part B; [employer]    Progress Note Due on Visit 10    SLP Start Time 0940    SLP Stop Time  1030    SLP Time Calculation (min) 50 min    Activity Tolerance Patient tolerated treatment well          Past Medical History:  Diagnosis Date   Anemia    Anxiety    Breast cancer (HCC)    Complication of anesthesia    difficult to wake up, very sensitive to medications   COPD (chronic obstructive pulmonary disease) (HCC)    Diabetes mellitus without complication (HCC)    Family history of adverse reaction to anesthesia    mother difficult to wake up   GERD (gastroesophageal reflux disease)    H/O breast implant    Hypertension    Hypothyroidism    Last menstrual period (LMP) > 10 days ago 10/30/2004   Pneumonia    Past Surgical History:  Procedure Laterality Date   BREAST ENHANCEMENT SURGERY     BREAST SURGERY     c sections x 2     DILATATION & CURETTAGE/HYSTEROSCOPY WITH MYOSURE N/A 09/11/2022   Procedure: FRACTIONAL DILATATION & CURETTAGE/HYSTEROSCOPY WITH MYOSURE, RESECTION OF POLYP;  Surgeon: Schermerhorn, Debby PARAS, MD;  Location: [facility] ORS;  Service: Gynecology;  Laterality: N/A;   DILATION AND CURETTAGE OF UTERUS     MASTECTOMY     TONSILLECTOMY     Patient Active Problem List   Diagnosis Date Noted   COPD exacerbation (HCC) 01/30/2023   Chronic narcotic use 01/30/2023   Uncontrolled type 2 diabetes mellitus with hyperglycemia, with long-term current use of insulin  (HCC) 01/30/2023   High risk medication use 11/10/2020   Allergic rhinitis 03/09/2020   Chronic  obstructive lung disease (HCC) 03/09/2020   Constipation 03/09/2020   Hypercholesterolemia 03/09/2020   Gastroesophageal reflux disease 03/09/2020   Hypothyroidism 03/09/2020   Migraine 03/09/2020   Obesity 03/09/2020   Tobacco user 03/09/2020   Acquired absence of both breasts 01/16/2018   Acute bronchitis due to infection 03/08/2016   Neoplasm of left breast, 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) 03/06/2016   Primary cancer of lower-outer quadrant of left breast (HCC) 05/03/2015   Infiltrating ductal carcinoma of right breast, stage 1 (HCC) 10/26/2014   Atypical chest pain 09/23/2014   Hyperlipidemia 09/23/2014   Hypertension 09/23/2014   Major depressive disorder 06/12/2011   Poisoning by drug or medicinal substance 06/11/2011    ONSET DATE:  ~ 5 years ago, date of referral 09/03/2024  REFERRING DIAG: R49.0 (ICD-10-CM) - Hoarseness   THERAPY DIAG:  Dysphonia  Rationale for Evaluation and Treatment Rehabilitation  SUBJECTIVE:   PERTINENT HISTORY: Pt is a 69 year old female with medical history of breast cancer, diabetes, COPD, reflux.   DIAGNOSTIC FINDINGS:  Laryngoscopy 09/03/2024 The nasal cavity is congested with clear secretions. Congestion consistent with allergies. She declined nasal steroid spray.  Vocal cords are mobile with senile atrophy of the cords, but no mucosal lesions. There is some mild mucosal  edema. 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 Given the senile atrophy of the cords, would recommend voice therapy in addition to continued reflux management and hydration to offset the drying effects of her medications.  SABRA    PAIN:  Are you having pain? No   FALLS: Has patient fallen in last 6 months? No,   LIVING ENVIRONMENT: Lives with: lives alone Lives in: House/apartment  PLOF: Independent  PATIENT GOALS    to improve voice, ability to talk  SUBJECTIVE STATEMENT: Pt pleasant, good historian, eager,   Pt accompanied by: self  OBJECTIVE:   TODAY'S TREATMENT:  Skilled treatment session focused on pt's dysphonia goals. SLP facilitated session by providing the following interventions:   Pt provided information related to recent CT of sinuses as well as allergy testing. She also reports that ENT paced her on antihistamine nasal spray.   Her vocal quality is noticeably improved during initial greeting in the waiting room and office. During conversation, she was observed with running out of air d/t longer sentence productions on single breath. As a result, her phonation became more hoarse at the ends of sentences. SLP provided skilled education on utilizing breath groups. With rare Min A, pt able to read at the paragraph level with good use of improved respiratory support and much improved vocal quality. Pt voiced understanding and eager to complete for homework.    PATIENT EDUCATION: Education details: see above Person educated: Patient Education method: Explanation Education comprehension: needs further education   HOME EXERCISE PROGRAM: Increase consumption of water in the morning, reduce consumption of carbonated water during the night Voice strengthening activities above     GOALS: Goals reviewed with patient? Yes  SHORT TERM GOALS: Target date: 10 sessions  The patient will increase hydration for an eventual goal of 6-8 glasses per day and limit caffeine intake (to maximum of 1-2, 8 oz cups/day), as measured by patient report.  Baseline: Goal status: INITIAL  2.  The patient will eliminate phonotraumatic behaviors such as chronic throat clearing, by substituting non-traumatic methods to clear mucus.  Baseline:  Goal status: INITIAL  3.  The patient will decrease laryngeal and articulatory muscle tension by independently completing relaxation/stretching exercises.  Baseline:  Goal status: INITIAL  4.  The patient will maximize voice quality and loudness using breath  support/oral resonance for sustained vowel production, pitch glides, and hierarchal speech drill.  Baseline:  Goal status: INITIAL   LONG TERM GOALS: Target date: 12/03/2024  The patient will demonstrate independent understanding of vocal hygiene concepts.  Baseline:  Goal status: INITIAL  2.  Pt will improve score on the VHI by 10% indicating improved perception of speech abilities. Baseline:  Goal status: INITIAL  ASSESSMENT:  CLINICAL IMPRESSION: Patient is a 69 y.o. female who was seen today for a voice treatment. She presents with improving vocal quality. See the above treatment note for details.    OBJECTIVE IMPAIRMENTS include voice disorder. These impairments are limiting patient from effectively communicating at home and in community. Factors affecting potential to achieve goals and functional outcome are co-morbidities and severity of impairments. Patient will benefit from skilled SLP services to address above impairments and improve overall function.  REHAB POTENTIAL: Good  PLAN: SLP FREQUENCY: 1-2x/week  SLP DURATION: 12 weeks  PLANNED INTERVENTIONS: SLP instruction and feedback, Compensatory strategies, and Patient/family education   Kyriaki Moder B. Rubbie, M.S., CCC-SLP, Tree Surgeon Certified Brain Injury Specialist [facility]  [facility] Office [phone] Ascom [phone]  Fax [phone]  "

## 2024-11-06 ENCOUNTER — Ambulatory Visit: Admitting: Speech Pathology

## 2024-11-11 ENCOUNTER — Ambulatory Visit: Admitting: Speech Pathology

## 2024-11-12 ENCOUNTER — Other Ambulatory Visit
Admission: RE | Admit: 2024-11-12 | Discharge: 2024-11-12 | Disposition: A | Discharge status: Home / Self Care | Source: Ambulatory Visit | Attending: Pulmonary Disease | Admitting: Pulmonary Disease

## 2024-11-12 DIAGNOSIS — J8283 Eosinophilic asthma: Secondary | ICD-10-CM | POA: Diagnosis present

## 2024-11-12 LAB — D-DIMER, QUANTITATIVE: D-Dimer, Quant: 0.45 ug{FEU}/mL (ref 0.00–0.50)

## 2024-11-13 ENCOUNTER — Ambulatory Visit: Admitting: Speech Pathology

## 2024-11-18 ENCOUNTER — Encounter
Admission: RE | Disposition: A | Discharge status: Home / Self Care | Payer: Self-pay | Source: Home / Self Care | Attending: Gastroenterology

## 2024-11-18 ENCOUNTER — Ambulatory Visit
Admission: RE | Admit: 2024-11-18 | Discharge: 2024-11-18 | Disposition: A | Discharge status: Home / Self Care | Attending: Gastroenterology | Admitting: Gastroenterology

## 2024-11-18 ENCOUNTER — Ambulatory Visit: Admitting: Speech Pathology

## 2024-11-18 ENCOUNTER — Ambulatory Visit: Admitting: Anesthesiology

## 2024-11-18 DIAGNOSIS — K640 First degree hemorrhoids: Secondary | ICD-10-CM | POA: Diagnosis not present

## 2024-11-18 DIAGNOSIS — D123 Benign neoplasm of transverse colon: Secondary | ICD-10-CM | POA: Insufficient documentation

## 2024-11-18 DIAGNOSIS — I10 Essential (primary) hypertension: Secondary | ICD-10-CM | POA: Diagnosis not present

## 2024-11-18 DIAGNOSIS — Z1211 Encounter for screening for malignant neoplasm of colon: Secondary | ICD-10-CM | POA: Diagnosis present

## 2024-11-18 DIAGNOSIS — Z7984 Long term (current) use of oral hypoglycemic drugs: Secondary | ICD-10-CM | POA: Insufficient documentation

## 2024-11-18 DIAGNOSIS — K5730 Diverticulosis of large intestine without perforation or abscess without bleeding: Secondary | ICD-10-CM | POA: Diagnosis not present

## 2024-11-18 DIAGNOSIS — D125 Benign neoplasm of sigmoid colon: Secondary | ICD-10-CM | POA: Diagnosis not present

## 2024-11-18 DIAGNOSIS — K5140 Inflammatory polyps of colon without complications: Secondary | ICD-10-CM | POA: Diagnosis not present

## 2024-11-18 DIAGNOSIS — J449 Chronic obstructive pulmonary disease, unspecified: Secondary | ICD-10-CM | POA: Insufficient documentation

## 2024-11-18 DIAGNOSIS — Z7985 Long-term (current) use of injectable non-insulin antidiabetic drugs: Secondary | ICD-10-CM | POA: Insufficient documentation

## 2024-11-18 DIAGNOSIS — E119 Type 2 diabetes mellitus without complications: Secondary | ICD-10-CM | POA: Diagnosis not present

## 2024-11-18 DIAGNOSIS — Z794 Long term (current) use of insulin: Secondary | ICD-10-CM | POA: Insufficient documentation

## 2024-11-18 DIAGNOSIS — E039 Hypothyroidism, unspecified: Secondary | ICD-10-CM | POA: Diagnosis not present

## 2024-11-18 HISTORY — PX: COLONOSCOPY: SHX5424

## 2024-11-18 HISTORY — PX: POLYPECTOMY: SHX149

## 2024-11-18 LAB — GLUCOSE, CAPILLARY: Glucose-Capillary: 83 mg/dL (ref 70–99)

## 2024-11-18 MED ORDER — LIDOCAINE HCL (PF) 2 % IJ SOLN
INTRAMUSCULAR | Status: AC
  Filled 2024-11-18: qty 15

## 2024-11-18 MED ORDER — SODIUM CHLORIDE 0.9 % IV SOLN: INTRAVENOUS | Status: DC

## 2024-11-18 MED ORDER — LIDOCAINE HCL (PF) 2 % IJ SOLN
INTRAMUSCULAR | Status: DC | PRN
  Administered 2024-11-18: 40 mg via INTRADERMAL

## 2024-11-18 MED ORDER — PROPOFOL 10 MG/ML IV BOLUS
INTRAVENOUS | Status: DC | PRN
  Administered 2024-11-18 (×6): 20 mg via INTRAVENOUS
  Administered 2024-11-18: 50 mg via INTRAVENOUS

## 2024-11-18 NOTE — Transfer of Care (Signed)
 Immediate Anesthesia Transfer of Care Note  Patient: Autumn Ramos  Procedure(s) Performed: COLONOSCOPY POLYPECTOMY, INTESTINE  Patient Location: PACU and Endoscopy Unit  Anesthesia Type:MAC  Level of Consciousness: awake and alert   Airway & Oxygen Therapy: Patient Spontanous Breathing and Patient connected to nasal cannula oxygen  Post-op Assessment: Report given to RN and Post -op Vital signs reviewed and stable  Post vital signs: Reviewed and stable  Last Vitals:  Vitals Value Taken Time  BP 85/71 11/18/24 12:31  Temp    Pulse 88 11/18/24 12:32  Resp 20 11/18/24 12:32  SpO2 98 % 11/18/24 12:32  Vitals shown include unfiled device data.  Last Pain:  Vitals:   11/18/24 1118  TempSrc: Temporal         Complications: No notable events documented.

## 2024-11-18 NOTE — H&P (Signed)
 Outpatient short stay form Pre-procedure 11/18/2024  Ole ONEIDA Schick, MD  Primary Physician: Salli Amato, MD  Reason for visit:  Surveillance  History of present illness:    69 y/o lady with history of hypothyroidism, DM II, COPD, and hypertension here for surveillance colonoscopy. Last colonoscopy was in 2009. No blood thinners. No family history of GI malignancies.   Current Medications[1]  Medications Prior to Admission  Medication Sig Dispense Refill Last Dose/Taking   levothyroxine  (SYNTHROID , LEVOTHROID) 100 MCG tablet Take 100 mcg by mouth daily before breakfast.   11/17/2024   losartan (COZAAR) 25 MG tablet Take 25 mg by mouth daily.   11/16/2024   tirzepatide (MOUNJARO) 2.5 MG/0.5ML Pen Inject 2.5 mg into the skin once a week.   11/09/2024   TRELEGY ELLIPTA 200-62.5-25 MCG/ACT AEPB Inhale 1 puff into the lungs daily.   11/17/2024   Accu-Chek Softclix Lancets lancets Accu-Chek Softclix Lancets      albuterol  (PROVENTIL  HFA;VENTOLIN  HFA) 108 (90 Base) MCG/ACT inhaler Inhale 1-2 puffs into the lungs every 4 (four) hours as needed for wheezing or shortness of breath.      ALPRAZolam (XANAX) 0.25 MG tablet as needed. For procedure      atorvastatin  (LIPITOR) 80 MG tablet Take 80 mg by mouth at bedtime.  0 11/16/2024   benzonatate  (TESSALON ) 200 MG capsule Take 1 capsule (200 mg total) by mouth 3 (three) times daily. (Patient not taking: Reported on 03/22/2023) 20 capsule 0    Cholecalciferol  (VITAMIN D3) 50 MCG (2000 UT) capsule Take 2,000 Units by mouth daily.      EPINEPHrine 0.3 mg/0.3 mL IJ SOAJ injection epinephrine 0.3 mg/0.3 mL injection, auto-injector      ezetimibe  (ZETIA ) 10 MG tablet Take 10 mg by mouth daily.   11/16/2024   gabapentin  (NEURONTIN ) 300 MG capsule Take 2 capsules by mouth at bedtime.   11/16/2024   glipiZIDE  (GLUCOTROL ) 10 MG tablet Take 10 mg by mouth daily before breakfast. (Patient not taking: Reported on 11/18/2024)   Not Taking   glucose blood test strip  OneTouch Ultra Blue Test Strip  TEST BLOOD GLUCOSE LEVELS TWICE DAILY      HYDROcodone -acetaminophen  (NORCO/VICODIN) 5-325 MG tablet Take by mouth.      ibuprofen  (ADVIL ,MOTRIN ) 800 MG tablet Take 800 mg by mouth every 6 (six) hours as needed for mild pain or moderate pain.       insulin  glargine (LANTUS ) 100 UNIT/ML injection Inject 15 Units into the skin at bedtime. (Patient not taking: Reported on 11/18/2024)   Not Taking   insulin  regular (NOVOLIN R) 100 units/mL injection Inject into the skin 3 (three) times daily before meals. SSI (Patient not taking: Reported on 11/18/2024)   Not Taking   ipratropium-albuterol  (DUONEB) 0.5-2.5 (3) MG/3ML SOLN Take 3 mLs by nebulization every 6 (six) hours as needed.      ipratropium-albuterol  (DUONEB) 0.5-2.5 (3) MG/3ML SOLN Take 3 mLs by nebulization every 4 (four) hours as needed. 360 mL 0    JANUMET XR 50-500 MG TB24 Take 1 tablet by mouth daily. (Patient not taking: Reported on 11/18/2024)   Not Taking   lisinopril  (PRINIVIL ,ZESTRIL ) 10 MG tablet Take 1 tablet by mouth daily. (Patient not taking: Reported on 11/18/2024)   Not Taking   metoprolol  succinate (TOPROL -XL) 25 MG 24 hr tablet Take 1 tablet by mouth daily.   11/16/2024   montelukast  (SINGULAIR ) 10 MG tablet Take 10 mg by mouth at bedtime.   11/16/2024   ondansetron  (ZOFRAN ) 8 MG tablet  Take 8 mg by mouth every 8 (eight) hours as needed for nausea or vomiting.       OZEMPIC, 0.25 OR 0.5 MG/DOSE, 2 MG/3ML SOPN  (Patient not taking: Reported on 11/18/2024)   Completed Course   pantoprazole  (PROTONIX ) 40 MG tablet Take 40 mg by mouth daily.      progesterone  (PROMETRIUM ) 100 MG capsule Take 100 mg by mouth daily.   11/16/2024   promethazine-dextromethorphan  (PROMETHAZINE-DM) 6.25-15 MG/5ML syrup Take 5 mLs by mouth every 6 (six) hours as needed for cough. (Patient not taking: Reported on 03/22/2023)        Allergies[2]   Past Medical History:  Diagnosis Date   Anemia    Anxiety    Breast cancer  (HCC)    Complication of anesthesia    difficult to wake up, very sensitive to medications   COPD (chronic obstructive pulmonary disease) (HCC)    Diabetes mellitus without complication (HCC)    Family history of adverse reaction to anesthesia    mother difficult to wake up   GERD (gastroesophageal reflux disease)    H/O breast implant    Hypertension    Hypothyroidism    Last menstrual period (LMP) > 10 days ago 10/30/2004   Pneumonia     Review of systems:  Otherwise negative.    Physical Exam  Gen: Alert, oriented. Appears stated age.  HEENT: PERRLA. Lungs: No respiratory distress CV: RRR Abd: soft, benign, no masses Ext: No edema    Planned procedures: Proceed with colonoscopy. The patient understands the nature of the planned procedure, indications, risks, alternatives and potential complications including but not limited to bleeding, infection, perforation, damage to internal organs and possible oversedation/side effects from anesthesia. The patient agrees and gives consent to proceed.  Please refer to procedure notes for findings, recommendations and patient disposition/instructions.     Ole ONEIDA Schick, MD [facility]         [1]  Current Facility-Administered Medications:    0.9 %  sodium chloride  infusion, , Intravenous, Continuous, Arbell Wycoff, Ole ONEIDA, MD, Last Rate: 20 mL/hr at 11/18/24 1130, New Bag at 11/18/24 1130 [2]  Allergies Allergen Reactions   Acetylcysteine Shortness Of Breath   Tramadol Itching   Codeine Itching and Nausea And Vomiting    Other Reaction(s): vomiting

## 2024-11-18 NOTE — Anesthesia Preprocedure Evaluation (Signed)
"                                    Anesthesia Evaluation  Patient identified by MRN, date of birth, ID band Patient awake    Reviewed: Allergy & Precautions, H&P , NPO status , Patient's Chart, lab work & pertinent test results, reviewed documented beta blocker date and time   History of Anesthesia Complications (+) Family history of anesthesia reaction and history of anesthetic complications  Airway Mallampati: II   Neck ROM: full    Dental  (+) Poor Dentition   Pulmonary pneumonia, resolved, COPD   Pulmonary exam normal        Cardiovascular Exercise Tolerance: Good hypertension, On Medications negative cardio ROS Normal cardiovascular exam Rhythm:regular Rate:Normal     Neuro/Psych  Headaches PSYCHIATRIC DISORDERS Anxiety Depression       GI/Hepatic Neg liver ROS,GERD  Medicated,,  Endo/Other  diabetesHypothyroidism    Renal/GU negative Renal ROS  negative genitourinary   Musculoskeletal   Abdominal   Peds  Hematology  (+) Blood dyscrasia, anemia   Anesthesia Other Findings Past Medical History: No date: Anemia No date: Anxiety No date: Breast cancer (HCC) No date: Complication of anesthesia     Comment:  difficult to wake up, very sensitive to medications No date: COPD (chronic obstructive pulmonary disease) (HCC) No date: Diabetes mellitus without complication (HCC) No date: Family history of adverse reaction to anesthesia     Comment:  mother difficult to wake up No date: GERD (gastroesophageal reflux disease) No date: H/O breast implant No date: Hypertension No date: Hypothyroidism 10/30/2004: Last menstrual period (LMP) > 10 days ago No date: Pneumonia Past Surgical History: No date: BREAST ENHANCEMENT SURGERY No date: BREAST SURGERY No date: c sections x 2 09/11/2022: DILATATION & CURETTAGE/HYSTEROSCOPY WITH MYOSURE; N/A     Comment:  Procedure: FRACTIONAL DILATATION &               CURETTAGE/HYSTEROSCOPY WITH MYOSURE,  RESECTION OF POLYP;               Surgeon: Schermerhorn, Debby PARAS, MD;  Location: [facility] ORS;              Service: Gynecology;  Laterality: N/A; No date: DILATION AND CURETTAGE OF UTERUS No date: MASTECTOMY No date: TONSILLECTOMY   Reproductive/Obstetrics negative OB ROS                              Anesthesia Physical Anesthesia Plan  ASA: 3  Anesthesia Plan: General   Post-op Pain Management:    Induction:   PONV Risk Score and Plan:   Airway Management Planned:   Additional Equipment:   Intra-op Plan:   Post-operative Plan:   Informed Consent: I have reviewed the patients History and Physical, chart, labs and discussed the procedure including the risks, benefits and alternatives for the proposed anesthesia with the patient or authorized representative who has indicated his/her understanding and acceptance.     Dental Advisory Given  Plan Discussed with: CRNA  Anesthesia Plan Comments:         Anesthesia Quick Evaluation  "

## 2024-11-18 NOTE — Op Note (Signed)
 [facility] Patient Name: Autumn Ramos Procedure Date: 11/18/2024 11:53 AM MRN: [id number] Account #: [id number] Date of Birth: 10-22-1956 Admit Type: Outpatient Age: 69 Room: [facility] ENDO ROOM 1 Gender: Female Note Status: Finalized Instrument Name: Colon Scope [id number] Procedure:             Colonoscopy Indications:           High risk colon cancer surveillance: Personal history                         of colonic polyps, Last colonoscopy: 2009 Providers:             Ole Schick MD, MD Referring MD:          Luke Shade (Referring MD) Medicines:             Monitored Anesthesia Care Complications:         No immediate complications. Estimated blood loss:                         Minimal. Procedure:             Pre-Anesthesia Assessment:                        - Prior to the procedure, a History and Physical was                         performed, and patient medications and allergies were                         reviewed. The patient is competent. The risks and                         benefits of the procedure and the sedation options and                         risks were discussed with the patient. All questions                         were answered and informed consent was obtained.                         Patient identification and proposed procedure were                         verified by the physician, the nurse, the                         anesthesiologist, the anesthetist and the technician                         in the endoscopy suite. Mental Status Examination:                         alert and oriented. Airway Examination: normal                         oropharyngeal airway and neck mobility. Respiratory  Examination: clear to auscultation. CV Examination:                         normal. Prophylactic Antibiotics: The patient does not                         require prophylactic antibiotics. Prior                          Anticoagulants: The patient has taken no anticoagulant                         or antiplatelet agents. ASA Grade Assessment: III - A                         patient with severe systemic disease. After reviewing                         the risks and benefits, the patient was deemed in                         satisfactory condition to undergo the procedure. The                         anesthesia plan was to use monitored anesthesia care                         (MAC). Immediately prior to administration of                         medications, the patient was re-assessed for adequacy                         to receive sedatives. The heart rate, respiratory                         rate, oxygen saturations, blood pressure, adequacy of                         pulmonary ventilation, and response to care were                         monitored throughout the procedure. The physical                         status of the patient was re-assessed after the                         procedure.                        After obtaining informed consent, the colonoscope was                         passed under direct vision. Throughout the procedure,                         the patient's blood pressure, pulse, and oxygen  saturations were monitored continuously. The                         Colonoscope was introduced through the anus and                         advanced to the the cecum, identified by appendiceal                         orifice and ileocecal valve. The colonoscopy was                         performed without difficulty. The patient tolerated                         the procedure well. The quality of the bowel                         preparation was good. The ileocecal valve, appendiceal                         orifice, and rectum were photographed. Findings:      The perianal and digital rectal examinations were normal.      Multiple small-mouthed diverticula were  found in the sigmoid colon,       descending colon, splenic flexure, transverse colon, hepatic flexure and       ascending colon.      A 2 mm polyp was found in the transverse colon. The polyp was sessile.       The polyp was removed with a jumbo cold forceps. Resection and retrieval       were complete. Estimated blood loss was minimal.      A 3 mm polyp was found in the transverse colon. The polyp was sessile.       The polyp was removed with a cold snare. Resection was complete, but the       polyp tissue was not retrieved. Estimated blood loss was minimal.      A 2 mm polyp was found in the sigmoid colon. The polyp was sessile. The       polyp was removed with a cold snare. Resection and retrieval were       complete. Estimated blood loss was minimal.      Internal hemorrhoids were found during retroflexion. The hemorrhoids       were Grade I (internal hemorrhoids that do not prolapse).      The exam was otherwise without abnormality on direct and retroflexion       views. Impression:            - Diverticulosis in the sigmoid colon, in the                         descending colon, at the splenic flexure, in the                         transverse colon, at the hepatic flexure and in the                         ascending colon.                        -  One 2 mm polyp in the transverse colon, removed with                         a jumbo cold forceps. Resected and retrieved.                        - One 3 mm polyp in the transverse colon, removed with                         a cold snare. Complete resection. Polyp tissue not                         retrieved.                        - One 2 mm polyp in the sigmoid colon, removed with a                         cold snare. Resected and retrieved.                        - Internal hemorrhoids.                        - The examination was otherwise normal on direct and                         retroflexion views. Recommendation:        -  Discharge patient to home.                        - Resume previous diet.                        - Continue present medications.                        - Await pathology results.                        - Repeat colonoscopy in 5 years for surveillance.                        - Return to referring physician as previously                         scheduled. Procedure Code(s):     --- Professional ---                        [phone], Colonoscopy, flexible; with removal of                         tumor(s), polyp(s), or other lesion(s) by snare                         technique                        45380, 59, Colonoscopy, flexible; with biopsy, single  or multiple Diagnosis Code(s):     --- Professional ---                        Z86.010, Personal history of colonic polyps                        K64.0, First degree hemorrhoids                        D12.3, Benign neoplasm of transverse colon (hepatic                         flexure or splenic flexure)                        D12.5, Benign neoplasm of sigmoid colon                        K57.30, Diverticulosis of large intestine without                         perforation or abscess without bleeding CPT copyright 2022 [employer]. All rights reserved. The codes documented in this report are preliminary and upon coder review may  be revised to meet current compliance requirements. Ole Schick MD, MD 11/18/2024 12:31:14 PM Number of Addenda: 0 Note Initiated On: 11/18/2024 11:53 AM Scope Withdrawal Time: 0 hours 11 minutes 20 seconds  Total Procedure Duration: 0 hours 17 minutes 41 seconds  Estimated Blood Loss:  Estimated blood loss was minimal.      [facility]

## 2024-11-18 NOTE — Interval H&P Note (Signed)
 History and Physical Interval Note:  11/18/2024 11:52 AM  Autumn Ramos  has presented today for surgery, with the diagnosis of Screening for colon cancer (Z12.11).  The various methods of treatment have been discussed with the patient and family. After consideration of risks, benefits and other options for treatment, the patient has consented to  Procedures: COLONOSCOPY (N/A) as a surgical intervention.  The patient's history has been reviewed, patient examined, no change in status, stable for surgery.  I have reviewed the patient's chart and labs.  Questions were answered to the patient's satisfaction.     Ole ONEIDA Schick  Ok to proceed with colonoscopy

## 2024-11-19 LAB — SURGICAL PATHOLOGY

## 2024-11-20 ENCOUNTER — Ambulatory Visit: Admitting: Speech Pathology

## 2024-11-20 DIAGNOSIS — R490 Dysphonia: Secondary | ICD-10-CM

## 2024-11-20 NOTE — Therapy (Signed)
 " OUTPATIENT SPEECH LANGUAGE PATHOLOGY  VOICE TREATMENT NOTE DISCHARGE SUMMARY   Patient Name: Autumn Ramos MRN: [id number] DOB:08-02-1956, 69 y.o., female Today's Date: 11/20/2024  PCP: Velva Calkins, MD REFERRING PROVIDER: Deward Dolly, MD   End of Session - 11/20/24 1434     Visit Number 8    Number of Visits 25    Date for Recertification  12/03/24    Authorization Type Medicare Part A/Medicare Part B; [employer]    Progress Note Due on Visit 10    SLP Start Time 1315    SLP Stop Time  1400    SLP Time Calculation (min) 45 min    Activity Tolerance Patient tolerated treatment well          Past Medical History:  Diagnosis Date   Anemia    Anxiety    Breast cancer (HCC)    Complication of anesthesia    difficult to wake up, very sensitive to medications   COPD (chronic obstructive pulmonary disease) (HCC)    Diabetes mellitus without complication (HCC)    Family history of adverse reaction to anesthesia    mother difficult to wake up   GERD (gastroesophageal reflux disease)    H/O breast implant    Hypertension    Hypothyroidism    Last menstrual period (LMP) > 10 days ago 10/30/2004   Pneumonia    Past Surgical History:  Procedure Laterality Date   BREAST ENHANCEMENT SURGERY     BREAST SURGERY     c sections x 2     COLONOSCOPY N/A 11/18/2024   Procedure: COLONOSCOPY;  Surgeon: Maryruth Ole DASEN, MD;  Location: [facility];  Service: Endoscopy;  Laterality: N/A;   DILATATION & CURETTAGE/HYSTEROSCOPY WITH MYOSURE N/A 09/11/2022   Procedure: FRACTIONAL DILATATION & CURETTAGE/HYSTEROSCOPY WITH MYOSURE, RESECTION OF POLYP;  Surgeon: Schermerhorn, Debby PARAS, MD;  Location: [facility] OR[facility];  Service: Gynecology;  Laterality: N/A;   DILATION AND CURETTAGE OF UTERUS     MASTECTOMY     POLYPECTOMY  11/18/2024   Procedure: POLYPECTOMY, INTESTINE;  Surgeon: Maryruth Ole DASEN, MD;  Location: [facility];  Service: Endoscopy;;   TONSILLECTOMY     Patient  Active Problem List   Diagnosis Date Noted   COPD exacerbation (HCC) 01/30/2023   Chronic narcotic use 01/30/2023   Uncontrolled type 2 diabetes mellitus with hyperglycemia, with long-term current use of insulin  (HCC) 01/30/2023   High risk medication use 11/10/2020   Allergic rhinitis 03/09/2020   Chronic obstructive lung disease (HCC) 03/09/2020   Constipation 03/09/2020   Hypercholesterolemia 03/09/2020   Gastroesophageal reflux disease 03/09/2020   Hypothyroidism 03/09/2020   Migraine 03/09/2020   Obesity 03/09/2020   Tobacco user 03/09/2020   Acquired absence of both breasts 01/16/2018   Acute bronchitis due to infection 03/08/2016   Neoplasm of left breast, 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) 03/06/2016   Primary cancer of lower-outer quadrant of left breast (HCC) 05/03/2015   Infiltrating ductal carcinoma of right breast, stage 1 (HCC) 10/26/2014   Atypical chest pain 09/23/2014   Hyperlipidemia 09/23/2014   Hypertension 09/23/2014   Major depressive disorder 06/12/2011   Poisoning by drug or medicinal substance 06/11/2011    ONSET DATE:  ~ 5 years ago, date of referral 09/03/2024  REFERRING DIAG: R49.0 (ICD-10-CM) - Hoarseness   THERAPY DIAG:  Dysphonia  Rationale for Evaluation and Treatment Rehabilitation  SUBJECTIVE:   PERTINENT HISTORY: Pt is a 69 year old female with medical history  of breast cancer, diabetes, COPD, reflux.   DIAGNOSTIC FINDINGS:  Laryngoscopy 09/03/2024 The nasal cavity is congested with clear secretions. Congestion consistent with allergies. She declined nasal steroid spray.  Vocal cords are mobile with senile atrophy of the cords, but no mucosal lesions. There is some mild mucosal edema. 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 Given the senile atrophy of the cords, would recommend voice therapy in addition to continued reflux management and hydration to offset the drying effects of her  medications.  SABRA    PAIN:  Are you having pain? No   FALLS: Has patient fallen in last 6 months? No,   LIVING ENVIRONMENT: Lives with: lives alone Lives in: House/apartment  PLOF: Independent  PATIENT GOALS    to improve voice, ability to talk  SUBJECTIVE STATEMENT: Pt pleasant, good historian, eager,  Pt accompanied by: self  OBJECTIVE:   TODAY'S TREATMENT:  Skilled treatment session focused on pt's dysphonia goals. SLP facilitated session by providing the following interventions:   Pt returns to session after missing several d/t illness. Despite this, pt's vocal quality continues ot be much improved with no evidence of dysphonia. Additionally, her vocal intensity is improved and she states I am really seeing a difference as well. I went to lunch with a formal co-worker and while we were talking I caught myself and I made myself slow down I went thru the lunch without getting raspy which was unusual. She continues to be independent with vocal hygiene and completion of voice strengthening HEP. Education provided on guarding against developing habitual cough/throat clear in the setting of multiple URIs.    PATIENT EDUCATION: Education details: see above Person educated: Patient Education method: Explanation Education comprehension: needs further education   HOME EXERCISE PROGRAM: Increase consumption of water in the morning, reduce consumption of carbonated water during the night Voice strengthening activities above     GOALS: Goals reviewed with patient? Yes  SHORT TERM GOALS: Target date: 10 sessions  Updated: 11/20/2024 The patient will increase hydration for an eventual goal of 6-8 glasses per day and limit caffeine intake (to maximum of 1-2, 8 oz cups/day), as measured by patient report.  Baseline: Goal status: INITIAL: MET  2.  The patient will eliminate phonotraumatic behaviors such as chronic throat clearing, by substituting non-traumatic methods to clear  mucus.  Baseline:  Goal status: INITIAL: MET  3.  The patient will decrease laryngeal and articulatory muscle tension by independently completing relaxation/stretching exercises.  Baseline:  Goal status: INITIAL: MET  4.  The patient will maximize voice quality and loudness using breath support/oral resonance for sustained vowel production, pitch glides, and hierarchal speech drill.  Baseline:  Goal status: INITIAL: MET   LONG TERM GOALS: Target date: 12/03/2024  Updated: 11/20/2024 The patient will demonstrate independent understanding of vocal hygiene concepts.  Baseline:  Goal status: INITIAL: MET  2.  Pt will improve score on the VHI by 10% indicating improved perception of speech abilities. Baseline:  Goal status: INITIAL: greatly improved  ASSESSMENT:  CLINICAL IMPRESSION: Patient is a 69 y.o. female who was seen today for a voice treatment. She continues to demonstrate great vocal quality, intensity and hygiene. As such, she has successful met her goals. She is appropriate for discharge from skilled ST services.    Maisa Bedingfield B. Rubbie, M.S., CCC-SLP, CBIS Speech-Language Pathologist Certified Brain Injury Specialist [facility]  [facility] [phone] Ascom [phone] Fax [phone]  "

## 2024-11-25 ENCOUNTER — Ambulatory Visit: Admitting: Speech Pathology

## 2024-11-26 ENCOUNTER — Ambulatory Visit: Admitting: Speech Pathology

## 2024-11-27 ENCOUNTER — Ambulatory Visit: Admitting: Speech Pathology

## 2024-12-01 NOTE — Anesthesia Postprocedure Evaluation (Signed)
"   Anesthesia Post Note  Patient: Autumn Ramos  Procedure(s) Performed: COLONOSCOPY POLYPECTOMY, INTESTINE  Patient location during evaluation: PACU Anesthesia Type: General Level of consciousness: awake and alert Pain management: pain level controlled Vital Signs Assessment: post-procedure vital signs reviewed and stable Respiratory status: spontaneous breathing, nonlabored ventilation, respiratory function stable and patient connected to nasal cannula oxygen Cardiovascular status: blood pressure returned to baseline and stable Postop Assessment: no apparent nausea or vomiting Anesthetic complications: no   No notable events documented.   Last Vitals:  Vitals:   11/18/24 1239 11/18/24 1247  BP: (!) 193/83 (!) 159/69  Pulse: 90 84  Resp: 19 20  Temp:    SpO2: 100% 100%    Last Pain:  Vitals:   11/18/24 1247  TempSrc:   PainSc: 0-No pain                 Lynwood KANDICE Clause      "

## 2025-03-18 ENCOUNTER — Other Ambulatory Visit

## 2025-03-25 ENCOUNTER — Telehealth: Admitting: Oncology
